# Patient Record
Sex: Male | Born: 1935 | Race: White | Hispanic: No | Marital: Single | Smoking: Former smoker
Health system: Southern US, Community
[De-identification: ages and names within clinical notes are randomized; demographics above are authoritative.]

## PROBLEM LIST (undated history)

## (undated) DIAGNOSIS — E119 Type 2 diabetes mellitus without complications: Secondary | ICD-10-CM

## (undated) DIAGNOSIS — I10 Essential (primary) hypertension: Secondary | ICD-10-CM

## (undated) DIAGNOSIS — D649 Anemia, unspecified: Secondary | ICD-10-CM

## (undated) DIAGNOSIS — Z950 Presence of cardiac pacemaker: Secondary | ICD-10-CM

## (undated) DIAGNOSIS — N400 Enlarged prostate without lower urinary tract symptoms: Secondary | ICD-10-CM

## (undated) DIAGNOSIS — Z9181 History of falling: Secondary | ICD-10-CM

## (undated) DIAGNOSIS — H548 Legal blindness, as defined in USA: Secondary | ICD-10-CM

## (undated) DIAGNOSIS — G4700 Insomnia, unspecified: Secondary | ICD-10-CM

## (undated) DIAGNOSIS — I442 Atrioventricular block, complete: Secondary | ICD-10-CM

## (undated) DIAGNOSIS — I509 Heart failure, unspecified: Secondary | ICD-10-CM

## (undated) DIAGNOSIS — F0390 Unspecified dementia without behavioral disturbance: Secondary | ICD-10-CM

## (undated) DIAGNOSIS — R001 Bradycardia, unspecified: Secondary | ICD-10-CM

## (undated) DIAGNOSIS — R55 Syncope and collapse: Secondary | ICD-10-CM

## (undated) HISTORY — DX: Type 2 diabetes mellitus without complications: E11.9

## (undated) HISTORY — DX: Essential (primary) hypertension: I10

## (undated) HISTORY — DX: Insomnia, unspecified: G47.00

## (undated) HISTORY — DX: Benign prostatic hyperplasia without lower urinary tract symptoms: N40.0

---

## 2004-08-30 ENCOUNTER — Inpatient Hospital Stay (HOSPITAL_COMMUNITY): Admission: EM | Admit: 2004-08-30 | Discharge: 2004-09-05 | Payer: Self-pay | Admitting: Emergency Medicine

## 2004-09-10 ENCOUNTER — Inpatient Hospital Stay (HOSPITAL_COMMUNITY): Admission: EM | Admit: 2004-09-10 | Discharge: 2004-09-17 | Payer: Self-pay | Admitting: Emergency Medicine

## 2004-09-25 ENCOUNTER — Ambulatory Visit: Payer: Self-pay | Admitting: Internal Medicine

## 2004-09-25 ENCOUNTER — Inpatient Hospital Stay (HOSPITAL_COMMUNITY): Admission: EM | Admit: 2004-09-25 | Discharge: 2004-10-24 | Payer: Self-pay | Admitting: Emergency Medicine

## 2004-10-01 ENCOUNTER — Ambulatory Visit: Payer: Self-pay | Admitting: Internal Medicine

## 2004-10-02 ENCOUNTER — Ambulatory Visit: Payer: Self-pay | Admitting: Internal Medicine

## 2004-10-08 ENCOUNTER — Encounter: Payer: Self-pay | Admitting: *Deleted

## 2004-11-02 ENCOUNTER — Observation Stay (HOSPITAL_COMMUNITY): Admission: RE | Admit: 2004-11-02 | Discharge: 2004-11-03 | Payer: Self-pay | Admitting: Internal Medicine

## 2005-06-28 IMAGING — CR DG CHEST 1V PORT
1 series · 1 of 1 positions shown · non-contrast
Comparison: 10/11/2004

CLINICAL DATA: Shortness of breath

PORTABLE CHEST - 1 VIEW

[view not recorded]
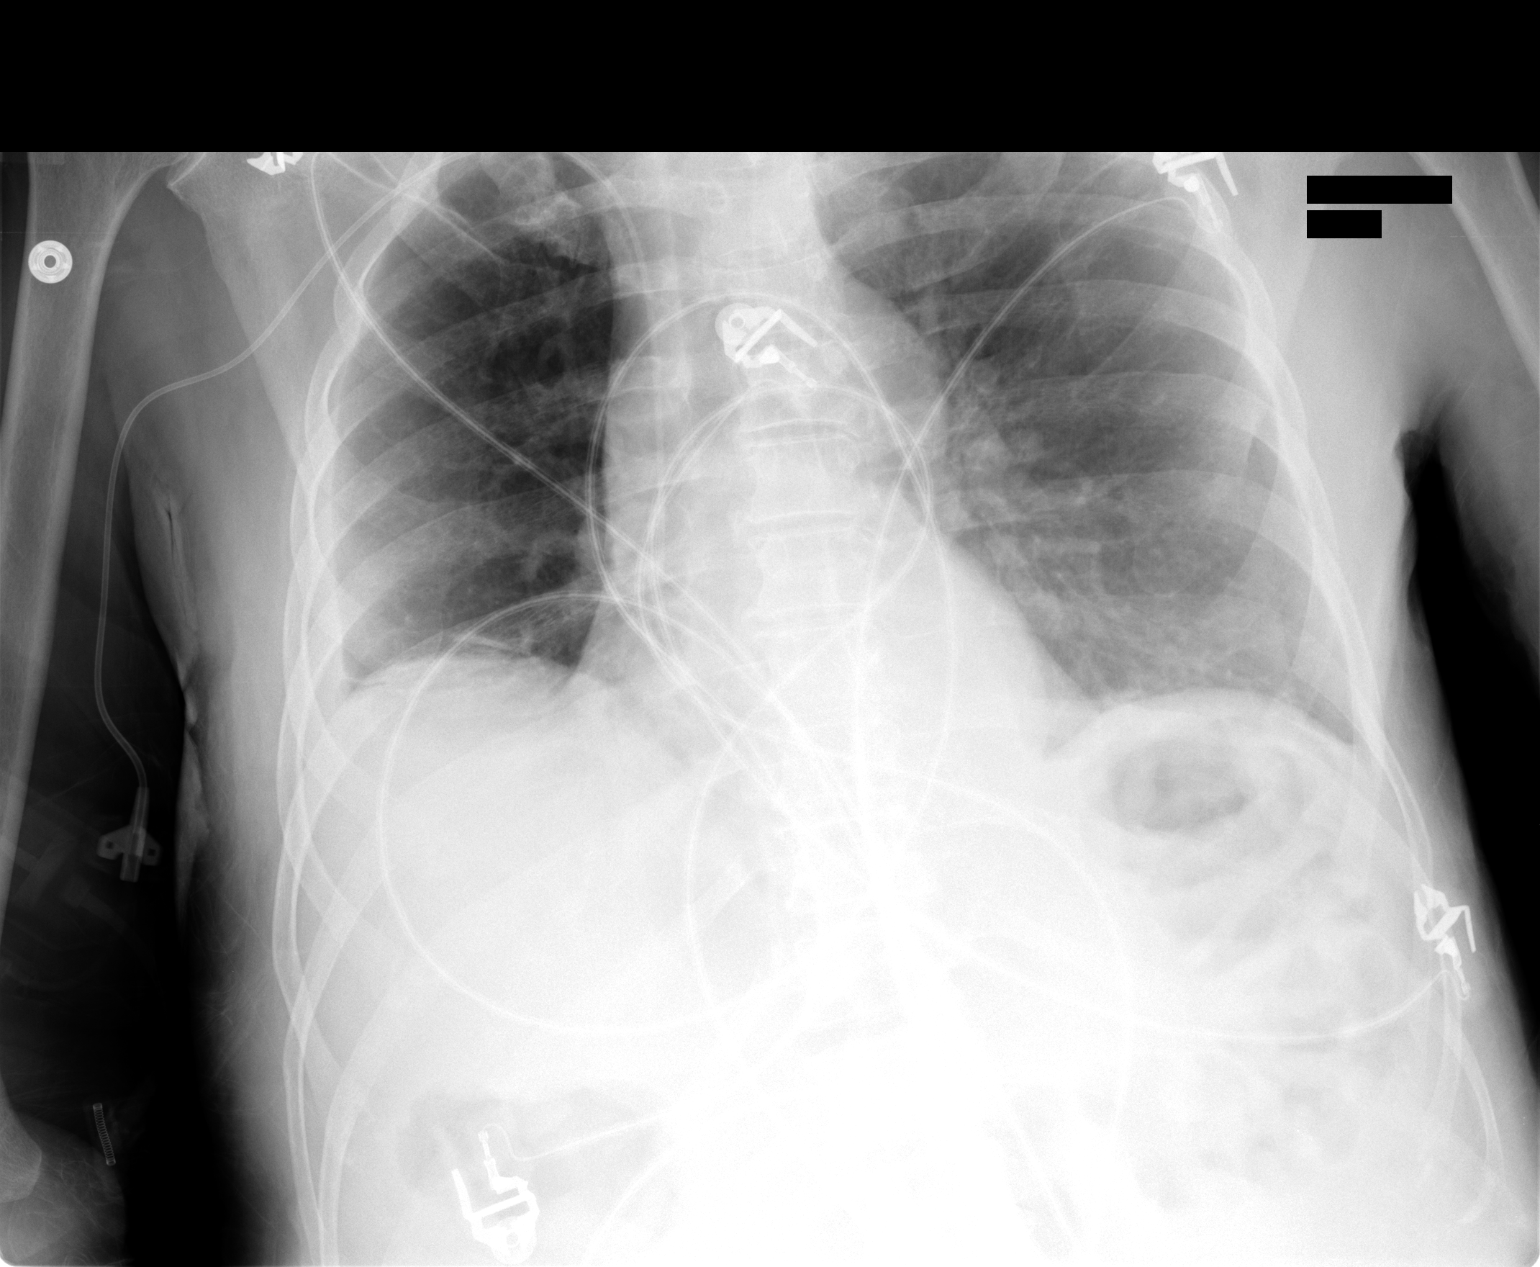

[1 of 1 positions shown; findings below may reference images not displayed]

FINDINGS: Right subclavian catheter tip at the junction of the superior vena
cava and right atrium. Interval removal of the feeding tube. Small right lateral
pleural effusion and mild right basilar atelectasis. Normal sized heart. Stable
mild prominence of the interstitial markings. Diffuse osteopenia.  

IMPRESSION

1. Small right pleural effusion and mild right basilar atelectasis.

2. Stable mild chronic interstitial lung disease.

## 2007-03-18 ENCOUNTER — Ambulatory Visit: Payer: Self-pay | Admitting: Cardiology

## 2007-04-29 ENCOUNTER — Ambulatory Visit: Payer: Self-pay | Admitting: Cardiology

## 2011-05-15 ENCOUNTER — Emergency Department (HOSPITAL_COMMUNITY)
Admission: EM | Admit: 2011-05-15 | Discharge: 2011-05-15 | Disposition: A | Discharge status: Skilled Nursing Facility | Payer: PRIVATE HEALTH INSURANCE | Attending: Emergency Medicine | Admitting: Emergency Medicine

## 2011-05-15 ENCOUNTER — Emergency Department (HOSPITAL_COMMUNITY): Payer: PRIVATE HEALTH INSURANCE

## 2011-05-15 ENCOUNTER — Encounter: Payer: Self-pay | Admitting: Emergency Medicine

## 2011-05-15 DIAGNOSIS — Z87891 Personal history of nicotine dependence: Secondary | ICD-10-CM | POA: Insufficient documentation

## 2011-05-15 DIAGNOSIS — E119 Type 2 diabetes mellitus without complications: Secondary | ICD-10-CM | POA: Insufficient documentation

## 2011-05-15 DIAGNOSIS — F0280 Dementia in other diseases classified elsewhere without behavioral disturbance: Secondary | ICD-10-CM | POA: Insufficient documentation

## 2011-05-15 DIAGNOSIS — G3183 Dementia with Lewy bodies: Secondary | ICD-10-CM | POA: Insufficient documentation

## 2011-05-15 DIAGNOSIS — R739 Hyperglycemia, unspecified: Secondary | ICD-10-CM

## 2011-05-15 DIAGNOSIS — I10 Essential (primary) hypertension: Secondary | ICD-10-CM | POA: Insufficient documentation

## 2011-05-15 DIAGNOSIS — E860 Dehydration: Secondary | ICD-10-CM | POA: Insufficient documentation

## 2011-05-15 HISTORY — DX: Unspecified dementia, unspecified severity, without behavioral disturbance, psychotic disturbance, mood disturbance, and anxiety: F03.90

## 2011-05-15 LAB — GLUCOSE, CAPILLARY

## 2011-05-15 LAB — URINALYSIS, ROUTINE W REFLEX MICROSCOPIC
Bilirubin Urine: NEGATIVE
Glucose, UA: >1000 mg/dL — AB
Hgb urine dipstick: NEGATIVE
Ketones, ur: NEGATIVE mg/dL
Leukocytes, UA: NEGATIVE
Nitrite: NEGATIVE
Protein, ur: NEGATIVE mg/dL
Specific Gravity, Urine: >1.03 — ABNORMAL HIGH (ref 1.005–1.030)
pH: 5.5 (ref 5.0–8.0)

## 2011-05-15 LAB — URINE MICROSCOPIC-ADD ON

## 2011-05-15 LAB — BASIC METABOLIC PANEL
CO2: 24 mEq/L (ref 19–32)
Calcium: 9.6 mg/dL (ref 8.4–10.5)
Chloride: 106 mEq/L (ref 96–112)
Creatinine, Ser: 1.15 mg/dL (ref 0.50–1.35)
GFR calc Af Amer: >60 mL/min (ref >60)
Glucose, Bld: 304 mg/dL — ABNORMAL HIGH (ref 70–99)

## 2011-05-15 MED ORDER — SODIUM CHLORIDE 0.9 % IV BOLUS (SEPSIS)
1000.0000 mL | Freq: Once | INTRAVENOUS | Status: AC
  Administered 2011-05-15: 1000 mL via INTRAVENOUS

## 2011-05-15 NOTE — ED Notes (Signed)
Pt resting. Nad. Awaiting pa re eval. Liter bolus in.

## 2011-05-15 NOTE — ED Notes (Signed)
Attempted in and out catherization failed. Pt did not urinate. FG, NT

## 2011-05-15 NOTE — ED Notes (Signed)
Darrall Dears from [facility] called to let us know pt has to be able to walk before he can come back there to the assisted living. Also stated pt fell x 2 today and was unable to walk. Pt able to stand at bedside and walk a couple of feet on own in ED. Nad. J Idol PA aware.

## 2011-05-15 NOTE — ED Notes (Signed)
Pt intermittantly sleeping. nad

## 2011-05-15 NOTE — ED Notes (Signed)
bs en route 284

## 2011-05-15 NOTE — ED Notes (Signed)
Pt smelled like urine. Depends wet, pants and depends off, pt cleaned. nad

## 2011-05-15 NOTE — ED Notes (Signed)
[facility] called and report given to [place]. LaToya given to report also. Facility said to call EMS for transport back

## 2011-05-15 NOTE — ED Notes (Signed)
[facility] notified of patient needing transportation back to facility.

## 2011-05-15 NOTE — ED Notes (Signed)
No change. Pt resting. Pt urinated on own. Sent to lab. Nad.

## 2011-05-15 NOTE — ED Notes (Signed)
Pt ambulated by Karen Chafe RN around nursing desk. Pt needed slight help with holding L hand and was slightly unstable. Unsure of baseline. 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 Pt does not appear weak and states he feels "fine" at this time. Nad.

## 2011-05-15 NOTE — ED Notes (Signed)
No change. Pt attempting to urinate at this time.

## 2011-05-15 NOTE — ED Notes (Signed)
Pt receiving liter bolus. Nad. No change.

## 2011-05-15 NOTE — ED Notes (Addendum)
ems states called out for stroke sx's. Paramedic states ran him in past before and seen no difference is neuro status. Pt came in alert/oriented. Smile symmetrical. Arm drift negative. Hand grips equal. Leg strength equal. Pt blind in L eye and states it always stays shut. Pt c/o "stinging" when he urinates. States had a normal bm yesterday and denies black or bloody stools. nad at this time. Pt also c/o "a little bit" of pain to mid abd area. Denies n/v

## 2011-05-15 NOTE — ED Provider Notes (Signed)
History     Chief Complaint  Patient presents with  . Urinary Frequency   HPI Comments: Spoke with caregiver Burna Mortimer from assisted living facility,  States patient has developed weakness,  Unable to walk this am without assistance, along with urinary incontinence and dark, strong smelling urine  and fecal incontinence x 1 when attempting to clean him of urine this am.  He is normally mildly demented but walks and toilets without assistance.  He ate breakfast this morning.  He has had no fevers,  No diarrhea or constipation..    Patient is a 77 y.o. male presenting with frequency. The history is provided by the patient and the nursing home.  Urinary Frequency This is a new problem. The current episode started today. The problem occurs constantly. Associated symptoms include weakness. Pertinent negatives include no abdominal pain, arthralgias, chest pain, congestion, fever, headaches, joint swelling, nausea, neck pain, numbness, rash or sore throat.  Urinary Frequency This is a new problem. The current episode started today. The problem occurs constantly. Pertinent negatives include no chest pain, no abdominal pain, no headaches and no shortness of breath.    Past Medical History  Diagnosis Date  . Hypertension   . Diabetes mellitus   . Dementia   . Parkinson's disease     Past Surgical History  Procedure Date  . Unknown     History reviewed. No pertinent family history.  History  Substance Use Topics  . Smoking status: Former Games developer  . Smokeless tobacco: Not on file  . Alcohol Use: No      Review of Systems  Constitutional: Negative for fever.  HENT: Negative for congestion, sore throat and neck pain.   Eyes: Negative.   Respiratory: Negative for chest tightness and shortness of breath.   Cardiovascular: Negative for chest pain.  Gastrointestinal: Negative for nausea and abdominal pain.  Genitourinary: Positive for dysuria and frequency.  Musculoskeletal: Negative for  joint swelling and arthralgias.  Skin: Negative.  Negative for rash and wound.  Neurological: Positive for weakness. Negative for dizziness, light-headedness, numbness and headaches.  Hematological: Negative.   Psychiatric/Behavioral: Negative.     Physical Exam  BP 137/84  Pulse 65  Temp(Src) 98.5 F (36.9 C) (Oral)  Resp 20  SpO2 96%  Physical Exam  Vitals reviewed. Constitutional: He is oriented to person, place, and time. He appears well-developed and well-nourished.  HENT:  Head: Normocephalic and atraumatic.  Eyes: Conjunctivae are normal.  Neck: Normal range of motion.  Cardiovascular: Normal rate, regular rhythm, normal heart sounds and intact distal pulses.   Pulmonary/Chest: Effort normal and breath sounds normal. He has no wheezes.  Abdominal: Soft. Bowel sounds are normal. There is no tenderness.  Musculoskeletal: Normal range of motion.  Neurological: He is alert and oriented to person, place, and time.  Skin: Skin is warm and dry.  Psychiatric: He has a normal mood and affect.    ED Course  Procedures Given IV fluids. MDM Patient ambulated in dept without difficulty.  States feels better and ready to go home.   Medical screening examination/treatment/procedure(s) were conducted as a shared visit with non-physician practitioner(s) and myself.  I personally evaluated the patient during the encounter   Candis Musa, [place] 05/15/11 1857  Shelda Jakes, MD 05/31/11 [phone]

## 2011-05-15 NOTE — ED Notes (Signed)
Pt only had couple drops in urinal. F graumann in to cath pt now. No change.

## 2011-05-15 NOTE — ED Provider Notes (Signed)
History     Chief Complaint  Patient presents with  . Urinary Frequency   HPI  Past Medical History  Diagnosis Date  . Hypertension   . Diabetes mellitus   . Dementia   . Parkinson's disease     Past Surgical History  Procedure Date  . Unknown     History reviewed. No pertinent family history.  History  Substance Use Topics  . Smoking status: Former Games developer  . Smokeless tobacco: Not on file  . Alcohol Use: No      Review of Systems  Physical Exam  BP 137/84  Pulse 65  Temp(Src) 98.5 F (36.9 C) (Oral)  Resp 20  SpO2 96%  Physical Exam  ED Course  Procedures  MDM PATIENT SEEN BY ME AND IF CXR NEGATIVE OKAY TO RETURN TO REST HOME IN [place]. CAN HAVE FOLLOW UP WITH DR Felecia Shelling.  Medical screening examination/treatment/procedure(s) were conducted as a shared visit with non-physician practitioner(s) and myself.  I personally evaluated the patient during the encounter      Shelda Jakes, MD 05/15/11 Windy Fast

## 2011-05-15 NOTE — ED Notes (Signed)
No urine o/p with in/out cath per NT, pt's depends was soaked. J Idol PA aware, diet gingerale given.

## 2011-08-02 ENCOUNTER — Emergency Department (HOSPITAL_COMMUNITY)
Admission: EM | Admit: 2011-08-02 | Discharge: 2011-08-02 | Discharge status: Against Medical Advice | Payer: PRIVATE HEALTH INSURANCE | Attending: Emergency Medicine | Admitting: Emergency Medicine

## 2011-08-02 ENCOUNTER — Encounter (HOSPITAL_COMMUNITY): Payer: Self-pay | Admitting: Emergency Medicine

## 2011-08-02 DIAGNOSIS — I10 Essential (primary) hypertension: Secondary | ICD-10-CM | POA: Insufficient documentation

## 2011-08-02 DIAGNOSIS — Z87891 Personal history of nicotine dependence: Secondary | ICD-10-CM | POA: Insufficient documentation

## 2011-08-02 DIAGNOSIS — G20 Parkinson's disease: Secondary | ICD-10-CM | POA: Insufficient documentation

## 2011-08-02 DIAGNOSIS — G20A1 Parkinson's disease without dyskinesia, without mention of fluctuations: Secondary | ICD-10-CM | POA: Insufficient documentation

## 2011-08-02 DIAGNOSIS — E119 Type 2 diabetes mellitus without complications: Secondary | ICD-10-CM | POA: Insufficient documentation

## 2011-08-02 DIAGNOSIS — F0390 Unspecified dementia without behavioral disturbance: Secondary | ICD-10-CM | POA: Insufficient documentation

## 2011-08-02 DIAGNOSIS — L299 Pruritus, unspecified: Secondary | ICD-10-CM

## 2011-08-02 MED ORDER — PREDNISONE 20 MG PO TABS
40.0000 mg | ORAL_TABLET | Freq: Once | ORAL | Status: AC
  Administered 2011-08-02: 40 mg via ORAL
  Filled 2011-08-02: qty 1

## 2011-08-02 MED ORDER — DIPHENHYDRAMINE HCL 25 MG PO CAPS
25.0000 mg | ORAL_CAPSULE | Freq: Once | ORAL | Status: AC
  Administered 2011-08-02: 25 mg via ORAL
  Filled 2011-08-02: qty 1

## 2011-08-02 MED ORDER — PREDNISONE 10 MG PO TABS: ORAL_TABLET | ORAL | Status: DC

## 2011-08-02 MED ORDER — DIPHENHYDRAMINE HCL 25 MG PO CAPS: 25.0000 mg | ORAL_CAPSULE | Freq: Four times a day (QID) | ORAL | Status: AC | PRN

## 2011-08-02 NOTE — ED Notes (Signed)
Pt c/o generalized itchy rash 

## 2011-08-02 NOTE — ED Provider Notes (Signed)
History     CSN: [id number] Arrival date & time: 08/02/2011  1:29 PM   First MD Initiated Contact with Patient 08/02/11 1418      Chief Complaint  Patient presents with  . Rash    (Consider location/radiation/quality/duration/timing/severity/associated sxs/prior treatment) Patient is a 77 y.o. male presenting with rash. The history is provided by a caregiver (Assisted living resident who presents with itching which has worsened involving bleeding of his ankles from rigorouis scratching.  He has been scratching since admitted to the facility 8/12,  but has become worse.).  Rash  This is a chronic problem. The problem has been gradually worsening. The problem is associated with an unknown factor. The rash is present on the right arm, left lower leg, right lower leg and groin. The pain is at a severity of 0/10. The patient is experiencing no pain. The pain has been constant since onset. Associated symptoms include itching. Pertinent negatives include no blisters, no pain and no weeping. Treatments tried: He is currently being given hydrocortisone cream and clotrimazole.  He was on hydroxyzine  but it is unclear if this was helpful,  given patients dementia. The treatment provided no relief.    Past Medical History  Diagnosis Date  . Hypertension   . Diabetes mellitus   . Dementia   . Parkinson's disease     Past Surgical History  Procedure Date  . Unknown     No family history on file.  History  Substance Use Topics  . Smoking status: Former Games developer  . Smokeless tobacco: Not on file  . Alcohol Use: No      Review of Systems  Constitutional: Negative for fever.  HENT: Negative for congestion, sore throat and sneezing.   Eyes: Negative for redness.  Respiratory: Negative for cough and shortness of breath.   Cardiovascular: Negative for leg swelling.  Gastrointestinal: Negative for nausea and abdominal pain.  Genitourinary: Negative.  Negative for hematuria.    Musculoskeletal: Negative for joint swelling.  Skin: Positive for itching. Negative for color change, rash and wound.  Neurological:       Patient with significant dementia.  Predominantly non ambulatory.  Hematological: Negative.   Psychiatric/Behavioral: Negative.     Allergies  Review of patient's allergies indicates no known allergies.  Home Medications   Current Outpatient Rx  Name Route Sig Dispense Refill  . ASPIRIN EC 81 MG PO TBEC Oral Take 81 mg by mouth daily.      Marland Kitchen CLOTRIMAZOLE-BETAMETHASONE 1-0.05 % EX CREA Topical Apply 1 application topically 2 (two) times daily as needed. Itching      . ESCITALOPRAM OXALATE 5 MG/5ML PO SOLN Oral Take 10 mg by mouth daily.      Marland Kitchen HALOPERIDOL 1 MG PO TABS Oral Take 1 mg by mouth at bedtime.      Marland Kitchen HYDROCORTISONE 2.5 % EX CREA Topical Apply 1 application topically 3 (three) times daily.      Marland Kitchen HYDROXYZINE HCL 25 MG PO TABS Oral Take 25 mg by mouth every 6 (six) hours as needed. itching     . INSULIN ASPART 100 UNIT/ML [place] SOLN Subcutaneous Inject 10 Units into the skin 3 (three) times daily before meals. If blood sugar is 70 or above 300 call MD 150-200 2 units(total 12 units) 201-250  3 units(total 13 units) 251-300  4 units(total 14 units) 301- 350 5 units (total 15 units) 351-400 6 units(total 16 units) Greater than 400 7 units (total 17 units)       .  INSULIN DETEMIR 100 UNIT/ML [place] SOLN Subcutaneous Inject 30 Units into the skin at bedtime.      . ISOSORBIDE MONONITRATE ER 30 MG PO TB24 Oral Take 30 mg by mouth daily.      Marland Kitchen SITAGLIPTIN PHOSPHATE 50 MG PO TABS Oral Take 50 mg by mouth daily.      Marland Kitchen TAMSULOSIN HCL 0.4 MG PO CAPS Oral Take 0.4 mg by mouth daily.      Marland Kitchen TERAZOSIN HCL 5 MG PO CAPS Oral Take 5 mg by mouth daily.      Marland Kitchen VITAMIN B-1 100 MG PO TABS Oral Take 100 mg by mouth daily.        BP 132/52  Pulse 68  Temp(Src) 99.5 F (37.5 C) (Oral)  Resp 20  Ht 6' (1.829 m)  Wt 205 lb (92.987 kg)  BMI 27.80 kg/m2   SpO2 96%  Physical Exam  Nursing note and vitals reviewed. Constitutional: He appears well-developed and well-nourished.  HENT:  Head: Normocephalic and atraumatic.  Eyes: Conjunctivae are normal.  Neck: Normal range of motion.  Cardiovascular: Normal rate, regular rhythm, normal heart sounds and intact distal pulses.   Pulmonary/Chest: Effort normal and breath sounds normal. He has no wheezes.  Abdominal: Soft. Bowel sounds are normal. There is no tenderness.  Musculoskeletal: Normal range of motion.  Neurological: He is alert.       Pleasantly demented,  Cooperative for exam.  Skin: Skin is warm and dry. No rash noted. No erythema.       Areas of small scabs on anterior ankles consistent with excoriations with no evidence of rash.  Scrotal skin is excoriated,  No erythema or edema,  No drainage.  Psychiatric: He has a normal mood and affect.    ED Course  Procedures (including critical care time)  Labs Reviewed - No data to display No results found.   No diagnosis found.    MDM  Pruritis without identifiable source.  No jaundice,  No rash identified,  No erythema or drainage.          Candis Musa, PA 08/02/11 [phone]

## 2011-08-06 NOTE — ED Provider Notes (Signed)
Medical screening examination/treatment/procedure(s) were performed by non-physician practitioner and as supervising physician I was immediately available for consultation/collaboration.   Nelia Shi, MD 08/06/11 2221

## 2013-05-26 ENCOUNTER — Emergency Department (HOSPITAL_COMMUNITY): Payer: PRIVATE HEALTH INSURANCE

## 2013-05-26 ENCOUNTER — Encounter (HOSPITAL_COMMUNITY): Payer: Self-pay | Admitting: Emergency Medicine

## 2013-05-26 ENCOUNTER — Emergency Department (HOSPITAL_COMMUNITY)
Admission: EM | Admit: 2013-05-26 | Discharge: 2013-05-26 | Disposition: A | Discharge status: Home / Self Care | Payer: PRIVATE HEALTH INSURANCE | Attending: Emergency Medicine | Admitting: Emergency Medicine

## 2013-05-26 DIAGNOSIS — Z87891 Personal history of nicotine dependence: Secondary | ICD-10-CM | POA: Insufficient documentation

## 2013-05-26 DIAGNOSIS — F0390 Unspecified dementia without behavioral disturbance: Secondary | ICD-10-CM | POA: Insufficient documentation

## 2013-05-26 DIAGNOSIS — R55 Syncope and collapse: Secondary | ICD-10-CM | POA: Insufficient documentation

## 2013-05-26 DIAGNOSIS — E119 Type 2 diabetes mellitus without complications: Secondary | ICD-10-CM | POA: Insufficient documentation

## 2013-05-26 DIAGNOSIS — R112 Nausea with vomiting, unspecified: Secondary | ICD-10-CM | POA: Insufficient documentation

## 2013-05-26 DIAGNOSIS — I10 Essential (primary) hypertension: Secondary | ICD-10-CM | POA: Insufficient documentation

## 2013-05-26 DIAGNOSIS — Z794 Long term (current) use of insulin: Secondary | ICD-10-CM | POA: Insufficient documentation

## 2013-05-26 DIAGNOSIS — Z79899 Other long term (current) drug therapy: Secondary | ICD-10-CM | POA: Insufficient documentation

## 2013-05-26 DIAGNOSIS — R0602 Shortness of breath: Secondary | ICD-10-CM | POA: Insufficient documentation

## 2013-05-26 DIAGNOSIS — R42 Dizziness and giddiness: Secondary | ICD-10-CM | POA: Insufficient documentation

## 2013-05-26 DIAGNOSIS — Z7982 Long term (current) use of aspirin: Secondary | ICD-10-CM | POA: Insufficient documentation

## 2013-05-26 LAB — CBC WITH DIFFERENTIAL/PLATELET
Basophils Relative: 0 % (ref 0–1)
HCT: 41.2 % (ref 39.0–52.0)
Hemoglobin: 13.5 g/dL (ref 13.0–17.0)
MCH: 28.8 pg (ref 26.0–34.0)
MCHC: 32.8 g/dL (ref 30.0–36.0)
Monocytes Absolute: 0.9 10*3/uL (ref 0.1–1.0)
Monocytes Relative: 9 % (ref 3–12)
Neutro Abs: 5.2 10*3/uL (ref 1.7–7.7)

## 2013-05-26 LAB — COMPREHENSIVE METABOLIC PANEL
Albumin: 3.5 g/dL (ref 3.5–5.2)
BUN: 31 mg/dL — ABNORMAL HIGH (ref 6–23)
Chloride: 103 mEq/L (ref 96–112)
Creatinine, Ser: 1.26 mg/dL (ref 0.50–1.35)
GFR calc Af Amer: 62 mL/min — ABNORMAL LOW (ref >90)
GFR calc non Af Amer: 53 mL/min — ABNORMAL LOW (ref >90)
Glucose, Bld: 134 mg/dL — ABNORMAL HIGH (ref 70–99)
Total Bilirubin: 0.2 mg/dL — ABNORMAL LOW (ref 0.3–1.2)

## 2013-05-26 LAB — URINALYSIS, ROUTINE W REFLEX MICROSCOPIC
Glucose, UA: NEGATIVE mg/dL
Leukocytes, UA: NEGATIVE
Nitrite: NEGATIVE
Protein, ur: NEGATIVE mg/dL
Urobilinogen, UA: 0.2 mg/dL (ref 0.0–1.0)

## 2013-05-26 LAB — TROPONIN I: Troponin I: <0.3 ng/mL (ref <0.30)

## 2013-05-26 LAB — RAPID URINE DRUG SCREEN, HOSP PERFORMED: Barbiturates: NOT DETECTED

## 2013-05-26 NOTE — ED Provider Notes (Signed)
CSN: [id number]     Arrival date & time 05/26/13  1732 History  This chart was scribed for Ward Givens, MD by Greggory Stallion, ED Scribe. This patient was seen in room APA05/APA05 and the patient's care was started at 5:42 PM.   Chief Complaint  Patient presents with  . Near Syncope  . Fall  . Dizziness   The history is provided by the EMS personnel, medical records and the patient. No language interpreter was used.    HPI Comments: Cody [name] is a 79 y.o. male with h/o dementia brought to ED by EMS who presents to the Emergency Department complaining of sudden onset, intermittent falls and dizziness. Pt lives at [facility]. He was at [facility] today and on 7/2 being evaluated for  falls and was sent here. He states he feels fine today. His last fall was yesterday. Pt states he gets dizzy spells and feels like he is going to pass out. He states he gets SOB and sometimes has had episodes of nausea and emesis. He states the episodes are associated with a feeling that he is going to  loose consciousness and happen when he's at rest, sitting or standing up. He denies actual LOC. He has not had any injury with the falls. Pt normally has these episodes every two weeks. He thinks this has been happening for a few months now. Pt denies HA, leg pain and chest pain as associated symptoms. Nothing makes the episode happen and nothing makes it stop. None of his medications have been changed recently. Pt uses a walker to get around.   Pt does not smoke cigarettes or drink alcohol.   PCP [facility]  Past Medical History  Diagnosis Date  . Hypertension   . Diabetes mellitus   . Dementia   . Parkinson's disease    Past Surgical History  Procedure Laterality Date  . Unknown     History reviewed. No pertinent family history. History  Substance Use Topics  . Smoking status: Former Smoker -- 0.50 packs/day for 4 years    Types: Cigarettes  . Smokeless  tobacco: Never Used  . Alcohol Use: No  lives in ALF Uses a walker  Review of Systems  Respiratory: Positive for shortness of breath.   Cardiovascular: Negative for chest pain.  Gastrointestinal: Positive for nausea and vomiting.  Neurological: Positive for dizziness and syncope. Negative for headaches.  All other systems reviewed and are negative.    Allergies  Review of patient's allergies indicates no known allergies.  Home Medications   Current Outpatient Rx  Name  Route  Sig  Dispense  Refill  . aspirin EC 81 MG tablet   Oral   Take 81 mg by mouth daily.           . clotrimazole-betamethasone (LOTRISONE) cream   Topical   Apply 1 application topically 2 (two) times daily as needed. Itching           . escitalopram (LEXAPRO) 5 MG/5ML solution   Oral   Take 10 mg by mouth daily.           . haloperidol (HALDOL) 1 MG tablet   Oral   Take 1 mg by mouth at bedtime.           . hydrocortisone 2.5 % cream   Topical   Apply 1 application topically 3 (three) times daily.           . hydrOXYzine (  ATARAX) 25 MG tablet   Oral   Take 25 mg by mouth every 6 (six) hours as needed. itching          . insulin aspart (NOVOLOG) 100 UNIT/ML injection   Subcutaneous   Inject 10 Units into the skin 3 (three) times daily before meals. If blood sugar is 70 or above 300 call MD 150-200 2 units(total 12 units) 201-250  3 units(total 13 units) 251-300  4 units(total 14 units) 301- 350 5 units (total 15 units) 351-400 6 units(total 16 units) Greater than 400 7 units (total 17 units)            . insulin detemir (LEVEMIR) 100 UNIT/ML injection   Subcutaneous   Inject 30 Units into the skin at bedtime.           . isosorbide mononitrate (IMDUR) 30 MG 24 hr tablet   Oral   Take 30 mg by mouth daily.           . predniSONE (DELTASONE) 10 MG tablet      Take 4 tablets for 2 days,  Then 3 tablets for 2 days,  2 tablets for 2 days,  Then 1 tablet for 2 days.    20 tablet   0   . sitaGLIPtin (JANUVIA) 50 MG tablet   Oral   Take 50 mg by mouth daily.           . Tamsulosin HCl (FLOMAX) 0.4 MG CAPS   Oral   Take 0.4 mg by mouth daily.           Marland Kitchen terazosin (HYTRIN) 5 MG capsule   Oral   Take 5 mg by mouth daily.           . Thiamine HCl (VITAMIN B-1) 100 MG tablet   Oral   Take 100 mg by mouth daily.            BP 159/64  Pulse 56  Temp(Src) 98.4 F (36.9 C) (Oral)  Resp 20  Ht 5\' 9"  (1.753 m)  Wt 195 lb (88.451 kg)  BMI 28.78 kg/m2  SpO2 99%  Vital signs normal except bradycardia   Physical Exam  Nursing note and vitals reviewed. Constitutional: He is oriented to person, place, and time. He appears well-developed and well-nourished.  Non-toxic appearance. He does not appear ill. No distress.  HENT:  Head: Normocephalic and atraumatic.  Right Ear: External ear normal.  Left Ear: External ear normal.  Nose: Nose normal. No mucosal edema or rhinorrhea.  Mouth/Throat: Oropharynx is clear and moist and mucous membranes are normal. No dental abscesses or edematous.  Edentulous.  Eyes: EOM are normal.  Left globe is gone.  Neck: Normal range of motion and full passive range of motion without pain. Neck supple.  Cardiovascular: Normal rate, regular rhythm and normal heart sounds.  Exam reveals no gallop and no friction rub.   No murmur heard. Pulmonary/Chest: Effort normal and breath sounds normal. No respiratory distress. He has no wheezes. He has no rhonchi. He has no rales. He exhibits no tenderness and no crepitus.  Abdominal: Soft. Normal appearance and bowel sounds are normal. He exhibits no distension. There is no tenderness. There is no rebound and no guarding.  Musculoskeletal: Normal range of motion. He exhibits no edema and no tenderness.  Moves all extremities well.   Neurological: He is alert and oriented to person, place, and time. He has normal strength. No cranial nerve deficit.  Neurovascularly intact.  Skin: Skin is warm, dry and intact. No rash noted. No erythema. No pallor.  Psychiatric: He has a normal mood and affect. His speech is normal and behavior is normal. His mood appears not anxious.    ED Course   Procedures (including critical care time)  Medications - No data to display  DIAGNOSTIC STUDIES: Oxygen Saturation is 99% on RA, normal by my interpretation.    COORDINATION OF CARE: 6:04 PM-Discussed treatment plan which includes labs and chest xray with pt at bedside and pt agreed to plan.   Pt refused to stand for orthostatic VS. He remained stable while in the ED.   Results for orders placed during the hospital encounter of 05/26/13  CBC WITH DIFFERENTIAL      Result Value Range   WBC 9.3  4.0 - 10.5 K/uL   RBC 4.69  4.22 - 5.81 MIL/uL   Hemoglobin 13.5  13.0 - 17.0 g/dL   HCT 40.9  81.1 - 91.4 %   MCV 87.8  78.0 - 100.0 fL   MCH 28.8  26.0 - 34.0 pg   MCHC 32.8  30.0 - 36.0 g/dL   RDW 78.2  95.6 - 21.3 %   Platelets 200  150 - 400 K/uL   Neutrophils Relative % 56  43 - 77 %   Neutro Abs 5.2  1.7 - 7.7 K/uL   Lymphocytes Relative 29  12 - 46 %   Lymphs Abs 2.7  0.7 - 4.0 K/uL   Monocytes Relative 9  3 - 12 %   Monocytes Absolute 0.9  0.1 - 1.0 K/uL   Eosinophils Relative 6 (*) 0 - 5 %   Eosinophils Absolute 0.5  0.0 - 0.7 K/uL   Basophils Relative 0  0 - 1 %   Basophils Absolute 0.0  0.0 - 0.1 K/uL  COMPREHENSIVE METABOLIC PANEL      Result Value Range   Sodium 139  135 - 145 mEq/L   Potassium 4.2  3.5 - 5.1 mEq/L   Chloride 103  96 - 112 mEq/L   CO2 29  19 - 32 mEq/L   Glucose, Bld 134 (*) 70 - 99 mg/dL   BUN 31 (*) 6 - 23 mg/dL   Creatinine, Ser 0.86  0.50 - 1.35 mg/dL   Calcium 9.0  8.4 - 57.8 mg/dL   Total Protein 6.9  6.0 - 8.3 g/dL   Albumin 3.5  3.5 - 5.2 g/dL   AST 16  0 - 37 U/L   ALT 16  0 - 53 U/L   Alkaline Phosphatase 94  39 - 117 U/L   Total Bilirubin 0.2 (*) 0.3 - 1.2 mg/dL   GFR calc non Af Amer 53 (*) >90 mL/min   GFR calc Af  Amer 62 (*) >90 mL/min  URINALYSIS, ROUTINE W REFLEX MICROSCOPIC      Result Value Range   Color, Urine YELLOW  YELLOW   APPearance CLEAR  CLEAR   Specific Gravity, Urine >1.030 (*) 1.005 - 1.030   pH 6.0  5.0 - 8.0   Glucose, UA NEGATIVE  NEGATIVE mg/dL   Hgb urine dipstick NEGATIVE  NEGATIVE   Bilirubin Urine NEGATIVE  NEGATIVE   Ketones, ur NEGATIVE  NEGATIVE mg/dL   Protein, ur NEGATIVE  NEGATIVE mg/dL   Urobilinogen, UA 0.2  0.0 - 1.0 mg/dL   Nitrite NEGATIVE  NEGATIVE   Leukocytes, UA NEGATIVE  NEGATIVE  TROPONIN I      Result Value Range  Troponin I <0.30  <0.30 ng/mL  MAGNESIUM      Result Value Range   Magnesium 2.1  1.5 - 2.5 mg/dL   Laboratory interpretation all normal   Dg Chest 2 View  05/26/2013   *RADIOLOGY REPORT*  Clinical Data: Near syncope, fall/dizziness.  CHEST - 2 VIEW  Comparison: 05/15/2011  Findings: Lungs are somewhat hypoinflated with chronic elevation of the right hemidiaphragm with chronic linear scarring over the right base.  Remainder of the lungs are clear. Mild cardiomegaly unchanged.  Remainder of the exam is unchanged.  IMPRESSION: No acute cardiopulmonary disease.  Minimal chronic linear scarring over the right base.  Mild stable cardiomegaly.   Original Report Authenticated By: Elberta Fortis, M.D.   Ct Head Wo Contrast  05/26/2013   *RADIOLOGY REPORT*  Clinical Data: Near syncope, dizziness  CT HEAD WITHOUT CONTRAST  Technique:  Contiguous axial images were obtained from the base of the skull through the vertex without contrast.  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 Cortical volume loss noted with proportional ventricular prominence.  Periventricular white matter hypodensity likely indicates small vessel ischemic change.  No acute hemorrhage, acute infarction, or mass lesion is identified.  Probable sessile meningioma along the anterior frontal midline falx.  There is stable.  No midline shift.  Mild moderate diffuse  pansinusitis. No bony destruction. Soft tissue density in the bilateral external auditory canals likely represents cerumen.  IMPRESSION: No acute intracranial finding.  Chronic findings as above.   Original Report Authenticated By: Christiana Pellant, M.D.     Date: 05/26/2013  Rate: 56  Rhythm: sinus bradycardia  QRS Axis: left  Intervals: PR prolonged  ST/T Wave abnormalities: normal  Conduction Disutrbances:none  Narrative Interpretation: anteroseptal Q waves  Old EKG Reviewed: changes noted from 10/03/2004 Q waves anteroseptally are new    1. Near syncope     Plan discharge   Devoria Albe, MD, FACEP   MDM  patient presents with infrequent near syncopal events. Patient is noted to be on Haldol and have baseline bradycardia. He does not have a prolonged QT interval. He has no acute electrolyte abnormalities including magnesium. He needs a long-term monitor to see if he is having any arrhythmia when he has his near syncopal events since they happen when he is sitting or standing. He will be referred to cardiology to arrange that.     I personally performed the services described in this documentation, which was scribed in my presence. The recorded information has been reviewed and considered.  Devoria Albe, MD, Armando Gang   Ward Givens, MD 05/26/13 [phone]

## 2013-05-26 NOTE — ED Notes (Signed)
Patient would not attempt to stand for v/s.  Patient stated that he "never stood without his walker and he was afraid he would fall".

## 2013-05-26 NOTE — ED Notes (Addendum)
Patient brought in via EMS. Alert with some confusion to month and situation. Per EMS patient from [facility], has hx of dementia. Patient was at [facility] been evaluated for near syncope x2 weeks with multiple falls, was sent here. Patient reports dizziness with fall yestertday. Reports nausea and diaphoresis with dizziness. Denies any pain.

## 2013-06-15 ENCOUNTER — Emergency Department (HOSPITAL_COMMUNITY)
Admission: EM | Admit: 2013-06-15 | Discharge: 2013-06-15 | Discharge status: Against Medical Advice | Payer: PRIVATE HEALTH INSURANCE

## 2013-06-15 ENCOUNTER — Encounter: Payer: Self-pay | Admitting: Cardiology

## 2013-06-15 ENCOUNTER — Encounter: Payer: PRIVATE HEALTH INSURANCE | Admitting: Cardiology

## 2013-06-15 DIAGNOSIS — R55 Syncope and collapse: Secondary | ICD-10-CM | POA: Insufficient documentation

## 2013-06-15 DIAGNOSIS — I10 Essential (primary) hypertension: Secondary | ICD-10-CM | POA: Insufficient documentation

## 2013-06-15 NOTE — Progress Notes (Signed)
NNo-show. This encounter was created in error - please disregard. 

## 2013-06-21 ENCOUNTER — Ambulatory Visit: Payer: PRIVATE HEALTH INSURANCE | Admitting: Cardiology

## 2013-07-01 ENCOUNTER — Ambulatory Visit (INDEPENDENT_AMBULATORY_CARE_PROVIDER_SITE_OTHER): Payer: PRIVATE HEALTH INSURANCE | Admitting: Cardiology

## 2013-07-01 ENCOUNTER — Ambulatory Visit: Payer: PRIVATE HEALTH INSURANCE | Admitting: Cardiology

## 2013-07-01 ENCOUNTER — Encounter: Payer: Self-pay | Admitting: Cardiology

## 2013-07-01 VITALS — BP 135/66 | HR 70 | Ht 64.0 in | Wt 172.0 lb

## 2013-07-01 DIAGNOSIS — R55 Syncope and collapse: Secondary | ICD-10-CM

## 2013-07-01 NOTE — Progress Notes (Signed)
Clinical Summary Mr. Oaxaca is a 79 y.o.male 1. Near syncope:  - patient is poor historian, history is combination of his report and his nurses aid. He lives at [facility].  - reports symptoms started approx 1 month ago - feels lightheaded and dizzy, feels like going to pass out but doesn't. No chest pain. Reports some SOB and palpitaitons with episodes. Reports heart feels like its "thumping". Per ER notes describes some occasional nausea and emesis w/ falls - seems to occur more often when on his feet. Last episode 1 month ago. No reported seizure activity by nursing staff.  - in ER CT head negative, EKG as described below w/ 1st degree block, non-spec conduction delay, LAFB      Past Medical History  Diagnosis Date  . Essential hypertension, benign   . Type 2 diabetes mellitus   . Dementia   . Parkinson's disease      No Known Allergies   Current Outpatient Prescriptions  Medication Sig Dispense Refill  . aspirin EC 81 MG tablet Take 81 mg by mouth daily.        . clotrimazole-betamethasone (LOTRISONE) cream Apply 1 application topically 2 (two) times daily as needed. Itching        . escitalopram (LEXAPRO) 10 MG tablet Take 10 mg by mouth daily.      . haloperidol (HALDOL) 1 MG tablet Take 1 mg by mouth at bedtime.        . hydrocortisone 2.5 % cream Apply 1 application topically 3 (three) times daily.        . hydrOXYzine (ATARAX) 25 MG tablet Take 25 mg by mouth every 6 (six) hours as needed. itching       . insulin aspart (NOVOLOG) 100 UNIT/ML injection Inject 10 Units into the skin 3 (three) times daily before meals. If blood sugar is 70 or above 300 call MD 150-200 2 units(total 12 units) 201-250  3 units(total 13 units) 251-300  4 units(total 14 units) 301- 350 5 units (total 15 units) 351-400 6 units(total 16 units) Greater than 400 7 units (total 17 units)         . insulin detemir (LEVEMIR) 100 UNIT/ML injection Inject 30 Units into the  skin at bedtime.        Marland Kitchen linagliptin (TRADJENTA) 5 MG TABS tablet Take 5 mg by mouth daily.      . sitaGLIPtin (JANUVIA) 50 MG tablet Take 50 mg by mouth daily.        . Tamsulosin HCl (FLOMAX) 0.4 MG CAPS Take 0.4 mg by mouth daily.        Marland Kitchen terazosin (HYTRIN) 5 MG capsule Take 5 mg by mouth daily.        . Thiamine HCl (VITAMIN B-1) 100 MG tablet Take 100 mg by mouth daily.         No current facility-administered medications for this visit.     No past surgical history on file.   No Known Allergies    No family history on file.   Social History Mr. Kinzler reports that he has quit smoking. His smoking use included Cigarettes. He has a 2 pack-year smoking history. He has never used smokeless tobacco. Mr. Seales reports that he does not drink alcohol.   Review of Systems 12 point ROS negative other than reported in HPI  Physical Examination Filed Vitals:   07/01/13 0920  BP: 135/66  Pulse: 70   Filed Weights   07/01/13 0920  Weight: 172 lb (78.019 kg)   Orthostatics Laying down: p 60 bp 178/78 Sitting: p 66 bp 148/75 Standing p 80 bp 143/84  Gen: resting comfortably, NAD HEENT: no scleral icterus, pupils equal round and reactive, no palptable cervical adenopathy CV: RRR, no m/r/g, no JVD, no carotid bruit Pulm: CTAB Abd: soft, NT, ND NABS, no hepatosplenomegaly Ext: warm, no edema.  Skin: warm, no rash Neuro: A&Ox3, no focal deficits    Diagnostic Studies 05/2013 EKG: sinus, rate 70, LAFB, non-spec conduction delay, 1st degree heart block, LAE, non-spec ST-T    Assessment and Plan  1. Presyncope: difficult to to identify the etiology, the patient is a poor historian with multiple medical comorbidities. Symptoms are concerning for a possible symptomatic arrhytmia, however he is significantly orthostatic, has Parkinsons disease which can lead to autonomic dysfunction, and is on multiple medications that can lead to these symptoms including imdur and  flomax - stop imdur, no clear indication to continue this medication, pt has no known hx of CAD or heart failure - check 2D echo - I have asked the nursing home to bring a bp log next visit - obtain 30 day event monitor, I am told by the home nursing staff that they can assist with managing this for the patient.  F/u 6 weeks   Antoine Poche, M.D., F.A.C.C.

## 2013-07-01 NOTE — Patient Instructions (Addendum)
Your physician recommends that you schedule a follow-up appointment in: 6 weeks  Your physician has requested that you have an echocardiogram. Echocardiography is a painless test that uses sound waves to create images of your heart. It provides your doctor with information about the size and shape of your heart and how well your heart's chambers and valves are working. This procedure takes approximately one hour. There are no restrictions for this procedure.  Your physician has recommended that you wear an event monitor. Event monitors are medical devices that record the heart's electrical activity. Doctors most often [place] these monitors to diagnose arrhythmias. Arrhythmias are problems with the speed or rhythm of the heartbeat. The monitor is a small, portable device. You can wear one while you do your normal daily activities. This is usually used to diagnose what is causing palpitations/syncope (passing out).  WE WILL CALL YOU WITH THE RESULTS  Your physician has recommended you make the following change in your medication:   1) STOP TAKING IMDUR

## 2013-07-06 ENCOUNTER — Ambulatory Visit (HOSPITAL_COMMUNITY)
Admission: RE | Admit: 2013-07-06 | Discharge: 2013-07-06 | Disposition: A | Discharge status: Home / Self Care | Payer: PRIVATE HEALTH INSURANCE | Source: Ambulatory Visit | Attending: Cardiology | Admitting: Cardiology

## 2013-07-06 DIAGNOSIS — I517 Cardiomegaly: Secondary | ICD-10-CM

## 2013-07-06 DIAGNOSIS — I10 Essential (primary) hypertension: Secondary | ICD-10-CM | POA: Insufficient documentation

## 2013-07-06 DIAGNOSIS — E119 Type 2 diabetes mellitus without complications: Secondary | ICD-10-CM | POA: Insufficient documentation

## 2013-07-06 DIAGNOSIS — Z87891 Personal history of nicotine dependence: Secondary | ICD-10-CM | POA: Insufficient documentation

## 2013-07-06 DIAGNOSIS — R55 Syncope and collapse: Secondary | ICD-10-CM | POA: Insufficient documentation

## 2013-07-06 DIAGNOSIS — G20A1 Parkinson's disease without dyskinesia, without mention of fluctuations: Secondary | ICD-10-CM | POA: Insufficient documentation

## 2013-07-06 DIAGNOSIS — G20 Parkinson's disease: Secondary | ICD-10-CM | POA: Insufficient documentation

## 2013-07-06 NOTE — Progress Notes (Signed)
*  PRELIMINARY RESULTS* Echocardiogram 2D Echocardiogram has been performed.  Darrel Baroni 07/06/2013, 12:20 PM

## 2013-07-16 ENCOUNTER — Telehealth: Payer: Self-pay | Admitting: Cardiology

## 2013-07-16 NOTE — Telephone Encounter (Signed)
Pt nurse advised he has tried 3 different event monitors and always takes them off no matter what they try, noted placed on 07-01-13, this nurse contacted dr branch to advise per pt mental capacity to remove the monitor the pt facility is requesting this be removed, dr branch gave verbal order to discontinue the event monitor, nurse given orders verbally to discontinue, this nurse contacted Mitch with [employer] to advise the pt therapy is d/c, mitch will alert the company

## 2013-07-16 NOTE — Telephone Encounter (Signed)
States that patient is not going to be able to wear the heart monitor.  States that he keeps pulling the leads off. Please call if you have any questions. / tgs

## 2013-07-26 ENCOUNTER — Other Ambulatory Visit: Payer: Self-pay | Admitting: *Deleted

## 2013-07-26 DIAGNOSIS — R55 Syncope and collapse: Secondary | ICD-10-CM

## 2013-08-12 ENCOUNTER — Encounter: Payer: Self-pay | Admitting: Cardiology

## 2013-08-12 ENCOUNTER — Ambulatory Visit (INDEPENDENT_AMBULATORY_CARE_PROVIDER_SITE_OTHER): Payer: PRIVATE HEALTH INSURANCE | Admitting: Cardiology

## 2013-08-12 VITALS — BP 149/60 | HR 76 | Ht 60.0 in | Wt 173.0 lb

## 2013-08-12 DIAGNOSIS — G20A1 Parkinson's disease without dyskinesia, without mention of fluctuations: Secondary | ICD-10-CM

## 2013-08-12 DIAGNOSIS — R55 Syncope and collapse: Secondary | ICD-10-CM

## 2013-08-12 DIAGNOSIS — G20 Parkinson's disease: Secondary | ICD-10-CM | POA: Insufficient documentation

## 2013-08-12 DIAGNOSIS — R42 Dizziness and giddiness: Secondary | ICD-10-CM

## 2013-08-12 NOTE — Patient Instructions (Addendum)
Your physician recommends that you schedule a follow-up appointment in:  4 months with Dr Wyline Mood  Your physician has recommended you make the following change in your medication:  1. STOP HYTRIN

## 2013-08-12 NOTE — Progress Notes (Signed)
Clinical Summary Mr. Farnell is a 79 y.o.male  1. Presyncope:  - patient is poor historian, history is combination of his report and his nurses aid. He lives at [facility].  - at last visit patient reported a 1 month history of feeling lightheaded and dizzy, feels like going to pass out but doesn't. No chest pain. Reports some SOB and palpitaitons with episodes. Reports heart feels like its "thumping".   - seems to occur more often when on his feet. He was found to be orthostatic in clinic - at last visit ordered an event monitor which the patient was not compliant with, per nursing staff he continuously pulled the monitor off. We also stopped his imdur and asked for a bp log, which was completed but has not been sent in yet.   - patient reports improved symptoms since last visit   Past Medical History  Diagnosis Date  . Essential hypertension, benign   . Type 2 diabetes mellitus   . Dementia   . Parkinson's disease      No Known Allergies   Current Outpatient Prescriptions  Medication Sig Dispense Refill  . aspirin EC 81 MG tablet Take 81 mg by mouth daily.        . clotrimazole-betamethasone (LOTRISONE) cream Apply 1 application topically 2 (two) times daily as needed. Itching        . escitalopram (LEXAPRO) 10 MG tablet Take 10 mg by mouth daily.      . haloperidol (HALDOL) 1 MG tablet Take 1 mg by mouth at bedtime.        . hydrocortisone 2.5 % cream Apply 1 application topically 3 (three) times daily.        . hydrOXYzine (ATARAX) 25 MG tablet Take 25 mg by mouth every 6 (six) hours as needed. itching       . insulin aspart (NOVOLOG) 100 UNIT/ML injection Inject 10 Units into the skin 3 (three) times daily before meals. If blood sugar is 70 or above 300 call MD 150-200 2 units(total 12 units) 201-250  3 units(total 13 units) 251-300  4 units(total 14 units) 301- 350 5 units (total 15 units) 351-400 6 units(total 16 units) Greater than 400 7 units  (total 17 units)         . insulin detemir (LEVEMIR) 100 UNIT/ML injection Inject 30 Units into the skin at bedtime.        Marland Kitchen linagliptin (TRADJENTA) 5 MG TABS tablet Take 5 mg by mouth daily.      . sitaGLIPtin (JANUVIA) 50 MG tablet Take 50 mg by mouth daily.        . Tamsulosin HCl (FLOMAX) 0.4 MG CAPS Take 0.4 mg by mouth daily.        Marland Kitchen terazosin (HYTRIN) 5 MG capsule Take 5 mg by mouth daily.        . Thiamine HCl (VITAMIN B-1) 100 MG tablet Take 100 mg by mouth daily.         No current facility-administered medications for this visit.     No past surgical history on file.   No Known Allergies    No family history on file.   Social History Mr. Want reports that he has quit smoking. His smoking use included Cigarettes. He has a 2 pack-year smoking history. He has never used smokeless tobacco. Mr. Riker reports that he does not drink alcohol.   Review of Systems CONSTITUTIONAL: No weight loss, fever, chills, weakness or fatigue.  HEENT: Eyes: No visual loss, blurred vision, double vision or yellow sclerae.No hearing loss, sneezing, congestion, runny nose or sore throat.  SKIN: No rash or itching.  CARDIOVASCULAR: per HPI RESPIRATORY: No shortness of breath, cough or sputum.  GASTROINTESTINAL: No anorexia, nausea, vomiting or diarrhea. No abdominal pain or blood.  GENITOURINARY: No burning on urination, no polyuria NEUROLOGICAL: per HPI MUSCULOSKELETAL: No muscle, back pain, joint pain or stiffness.  LYMPHATICS: No enlarged nodes. No history of splenectomy.  PSYCHIATRIC: No history of depression or anxiety.  ENDOCRINOLOGIC: No reports of sweating, cold or heat intolerance. No polyuria or polydipsia.  Marland Kitchen   Physical Examination p 76 bp 149/60 Wt 173 lbs BMI 29 Gen: resting comfortably, no acute distress HEENT: no scleral icterus, pupils equal round and reactive, no palptable cervical adenopathy,  CV: RRR, no m/r/g, no JVD, no carotid bruits Resp: Clear to  auscultation bilaterally GI: abdomen is soft, non-tender, non-distended, normal bowel sounds, no hepatosplenomegaly MSK: extremities are warm, no edema.  Skin: warm, no rash Neuro:  no focal deficits Psych: appropriate affect   Diagnostic Studies 05/2013 EKG: sinus, rate 70, LAFB, non-spec conduction delay, 1st degree heart block, LAE, non-spec ST-T  06/2013 Event monitor: difficulty with compliance due to mental status, no symptoms reported, no significant arrhythmias.   07/06/13 Echo: LVEF 60-65%, grade I diastolic dysfunction,   Assessment and Plan  1. Near syncope: difficult to to identify the etiology, the patient is a poor historian with multiple medical comorbidities.  - likely orthostatic dizziness due to combination of medication regimen, as well as underlying parkinsons which can cause some autonomic dysfunction - symptoms improved off imdur, he also of note is on 2 alpha blockers, I have stopped his hytrin and continued his flomax - the nursing home is to send in his bp log - he had described some palpitations during the episodes and thus an event monitor was ordered, however due to his mental status he continously pulled off the monitor. He does not complain of any further palpitations or symptoms, will not pursue further workup at this time for arrhythmia.         Antoine Poche, M.D., F.A.C.C.

## 2014-11-14 DIAGNOSIS — Z9181 History of falling: Secondary | ICD-10-CM

## 2014-11-14 HISTORY — DX: History of falling: Z91.81

## 2014-12-05 ENCOUNTER — Encounter (HOSPITAL_COMMUNITY): Payer: Self-pay

## 2014-12-05 ENCOUNTER — Inpatient Hospital Stay (HOSPITAL_COMMUNITY)
Admission: EM | Admit: 2014-12-05 | Discharge: 2014-12-08 | DRG: 871 | Disposition: A | Discharge status: Skilled Nursing Facility | Payer: Medicare Other | Attending: Internal Medicine | Admitting: Internal Medicine

## 2014-12-05 ENCOUNTER — Emergency Department (HOSPITAL_COMMUNITY): Payer: Medicare Other

## 2014-12-05 DIAGNOSIS — G4700 Insomnia, unspecified: Secondary | ICD-10-CM

## 2014-12-05 DIAGNOSIS — N400 Enlarged prostate without lower urinary tract symptoms: Secondary | ICD-10-CM | POA: Diagnosis present

## 2014-12-05 DIAGNOSIS — Z22322 Carrier or suspected carrier of Methicillin resistant Staphylococcus aureus: Secondary | ICD-10-CM

## 2014-12-05 DIAGNOSIS — F329 Major depressive disorder, single episode, unspecified: Secondary | ICD-10-CM | POA: Diagnosis present

## 2014-12-05 DIAGNOSIS — F0390 Unspecified dementia without behavioral disturbance: Secondary | ICD-10-CM | POA: Diagnosis present

## 2014-12-05 DIAGNOSIS — R059 Cough, unspecified: Secondary | ICD-10-CM

## 2014-12-05 DIAGNOSIS — R05 Cough: Secondary | ICD-10-CM

## 2014-12-05 DIAGNOSIS — I10 Essential (primary) hypertension: Secondary | ICD-10-CM | POA: Diagnosis present

## 2014-12-05 DIAGNOSIS — A419 Sepsis, unspecified organism: Secondary | ICD-10-CM | POA: Diagnosis present

## 2014-12-05 DIAGNOSIS — E119 Type 2 diabetes mellitus without complications: Secondary | ICD-10-CM

## 2014-12-05 DIAGNOSIS — E860 Dehydration: Secondary | ICD-10-CM | POA: Diagnosis present

## 2014-12-05 DIAGNOSIS — Z79899 Other long term (current) drug therapy: Secondary | ICD-10-CM

## 2014-12-05 DIAGNOSIS — R509 Fever, unspecified: Secondary | ICD-10-CM | POA: Diagnosis present

## 2014-12-05 DIAGNOSIS — Z87891 Personal history of nicotine dependence: Secondary | ICD-10-CM

## 2014-12-05 DIAGNOSIS — Z7982 Long term (current) use of aspirin: Secondary | ICD-10-CM

## 2014-12-05 DIAGNOSIS — J1100 Influenza due to unidentified influenza virus with unspecified type of pneumonia: Secondary | ICD-10-CM | POA: Diagnosis present

## 2014-12-05 DIAGNOSIS — IMO0001 Reserved for inherently not codable concepts without codable children: Secondary | ICD-10-CM | POA: Diagnosis present

## 2014-12-05 DIAGNOSIS — E1159 Type 2 diabetes mellitus with other circulatory complications: Secondary | ICD-10-CM

## 2014-12-05 DIAGNOSIS — Z794 Long term (current) use of insulin: Secondary | ICD-10-CM

## 2014-12-05 DIAGNOSIS — G20 Parkinson's disease: Secondary | ICD-10-CM | POA: Diagnosis present

## 2014-12-05 DIAGNOSIS — R55 Syncope and collapse: Secondary | ICD-10-CM | POA: Diagnosis present

## 2014-12-05 LAB — COMPREHENSIVE METABOLIC PANEL
ALBUMIN: 3.7 g/dL (ref 3.5–5.2)
ALT: 20 U/L (ref 0–53)
AST: 21 U/L (ref 0–37)
Alkaline Phosphatase: 75 U/L (ref 39–117)
Anion gap: 5 (ref 5–15)
BUN: 28 mg/dL — ABNORMAL HIGH (ref 6–23)
CHLORIDE: 104 mmol/L (ref 96–112)
CO2: 25 mmol/L (ref 19–32)
Calcium: 8.2 mg/dL — ABNORMAL LOW (ref 8.4–10.5)
Creatinine, Ser: 1.21 mg/dL (ref 0.50–1.35)
GFR calc Af Amer: 64 mL/min — ABNORMAL LOW (ref >90)
GFR calc non Af Amer: 55 mL/min — ABNORMAL LOW (ref >90)
GLUCOSE: 191 mg/dL — AB (ref 70–99)
Potassium: 4.1 mmol/L (ref 3.5–5.1)
Sodium: 134 mmol/L — ABNORMAL LOW (ref 135–145)
TOTAL PROTEIN: 7.1 g/dL (ref 6.0–8.3)
Total Bilirubin: 0.3 mg/dL (ref 0.3–1.2)

## 2014-12-05 LAB — URINALYSIS, ROUTINE W REFLEX MICROSCOPIC
Bilirubin Urine: NEGATIVE
Glucose, UA: 250 mg/dL — AB
HGB URINE DIPSTICK: NEGATIVE
Ketones, ur: NEGATIVE mg/dL
Leukocytes, UA: NEGATIVE
Nitrite: NEGATIVE
Protein, ur: 30 mg/dL — AB
UROBILINOGEN UA: 0.2 mg/dL (ref 0.0–1.0)
pH: 5.5 (ref 5.0–8.0)

## 2014-12-05 LAB — CBC WITH DIFFERENTIAL/PLATELET
BASOS ABS: 0 10*3/uL (ref 0.0–0.1)
Basophils Relative: 0 % (ref 0–1)
EOS PCT: 3 % (ref 0–5)
Eosinophils Absolute: 0.2 10*3/uL (ref 0.0–0.7)
HEMATOCRIT: 39.5 % (ref 39.0–52.0)
Hemoglobin: 13 g/dL (ref 13.0–17.0)
LYMPHS ABS: 1.2 10*3/uL (ref 0.7–4.0)
Lymphocytes Relative: 14 % (ref 12–46)
MCH: 28.6 pg (ref 26.0–34.0)
MCHC: 32.9 g/dL (ref 30.0–36.0)
MCV: 86.8 fL (ref 78.0–100.0)
Monocytes Absolute: 0.7 10*3/uL (ref 0.1–1.0)
Monocytes Relative: 9 % (ref 3–12)
Neutro Abs: 6.4 10*3/uL (ref 1.7–7.7)
Neutrophils Relative %: 74 % (ref 43–77)
RBC: 4.55 MIL/uL (ref 4.22–5.81)
RDW: 12.8 % (ref 11.5–15.5)
WBC: 8.6 10*3/uL (ref 4.0–10.5)

## 2014-12-05 LAB — URINE MICROSCOPIC-ADD ON

## 2014-12-05 LAB — I-STAT CG4 LACTIC ACID, ED: Lactic Acid, Venous: 2.66 mmol/L (ref 0.5–2.0)

## 2014-12-05 MED ORDER — SODIUM CHLORIDE 0.9 % IV SOLN: INTRAVENOUS | Status: DC

## 2014-12-05 MED ORDER — TAMSULOSIN HCL 0.4 MG PO CAPS
0.4000 mg | ORAL_CAPSULE | Freq: Every day | ORAL | Status: DC
  Administered 2014-12-06 – 2014-12-08 (×3): 0.4 mg via ORAL
  Filled 2014-12-05 (×3): qty 1

## 2014-12-05 MED ORDER — HALOPERIDOL 2 MG PO TABS
1.0000 mg | ORAL_TABLET | Freq: Every day | ORAL | Status: DC
  Administered 2014-12-06 – 2014-12-07 (×3): 1 mg via ORAL
  Filled 2014-12-05 (×3): qty 1

## 2014-12-05 MED ORDER — ASPIRIN EC 81 MG PO TBEC
81.0000 mg | DELAYED_RELEASE_TABLET | Freq: Every day | ORAL | Status: DC
  Administered 2014-12-06 – 2014-12-08 (×3): 81 mg via ORAL
  Filled 2014-12-05 (×3): qty 1

## 2014-12-05 MED ORDER — MELATONIN 5 MG PO CAPS: 10.0000 mg | ORAL_CAPSULE | Freq: Every day | ORAL | Status: DC

## 2014-12-05 MED ORDER — ONDANSETRON HCL 4 MG/2ML IJ SOLN: 4.0000 mg | Freq: Four times a day (QID) | INTRAMUSCULAR | Status: DC | PRN

## 2014-12-05 MED ORDER — OSELTAMIVIR PHOSPHATE 75 MG PO CAPS
75.0000 mg | ORAL_CAPSULE | Freq: Two times a day (BID) | ORAL | Status: DC
  Administered 2014-12-06 – 2014-12-08 (×6): 75 mg via ORAL
  Filled 2014-12-05 (×6): qty 1

## 2014-12-05 MED ORDER — INSULIN DETEMIR 100 UNIT/ML ~~LOC~~ SOLN
25.0000 [IU] | Freq: Every day | SUBCUTANEOUS | Status: DC
  Administered 2014-12-06 – 2014-12-07 (×3): 25 [IU] via SUBCUTANEOUS
  Filled 2014-12-05 (×4): qty 0.25

## 2014-12-05 MED ORDER — SODIUM CHLORIDE 0.45 % IV SOLN
INTRAVENOUS | Status: DC
  Administered 2014-12-06 (×2): via INTRAVENOUS

## 2014-12-05 MED ORDER — ONDANSETRON HCL 4 MG PO TABS: 4.0000 mg | ORAL_TABLET | Freq: Four times a day (QID) | ORAL | Status: DC | PRN

## 2014-12-05 MED ORDER — ACETAMINOPHEN 650 MG RE SUPP: 650.0000 mg | Freq: Four times a day (QID) | RECTAL | Status: DC | PRN

## 2014-12-05 MED ORDER — IPRATROPIUM BROMIDE 0.06 % NA SOLN
2.0000 | Freq: Four times a day (QID) | NASAL | Status: DC | PRN
  Filled 2014-12-05: qty 15

## 2014-12-05 MED ORDER — HEPARIN SODIUM (PORCINE) 5000 UNIT/ML IJ SOLN
5000.0000 [IU] | Freq: Three times a day (TID) | INTRAMUSCULAR | Status: DC
  Administered 2014-12-05 – 2014-12-07 (×7): 5000 [IU] via SUBCUTANEOUS
  Filled 2014-12-05 (×7): qty 1

## 2014-12-05 MED ORDER — ESCITALOPRAM OXALATE 10 MG PO TABS
10.0000 mg | ORAL_TABLET | Freq: Every day | ORAL | Status: DC
  Administered 2014-12-06 – 2014-12-08 (×3): 10 mg via ORAL
  Filled 2014-12-05 (×3): qty 1

## 2014-12-05 MED ORDER — VANCOMYCIN HCL IN DEXTROSE 1-5 GM/200ML-% IV SOLN
1000.0000 mg | Freq: Once | INTRAVENOUS | Status: AC
Start: 2014-12-05 — End: 2014-12-05
  Administered 2014-12-05: 1000 mg via INTRAVENOUS
  Filled 2014-12-05: qty 200

## 2014-12-05 MED ORDER — SODIUM CHLORIDE 0.9 % IV BOLUS (SEPSIS)
250.0000 mL | Freq: Once | INTRAVENOUS | Status: AC
  Administered 2014-12-05: 250 mL via INTRAVENOUS

## 2014-12-05 MED ORDER — PIPERACILLIN-TAZOBACTAM 3.375 G IVPB 30 MIN
3.3750 g | Freq: Once | INTRAVENOUS | Status: AC
Start: 2014-12-05 — End: 2014-12-05
  Administered 2014-12-05: 3.375 g via INTRAVENOUS
  Filled 2014-12-05: qty 50

## 2014-12-05 MED ORDER — FLUTICASONE PROPIONATE 50 MCG/ACT NA SUSP
2.0000 | Freq: Every day | NASAL | Status: DC
  Administered 2014-12-06 – 2014-12-07 (×3): 2 via NASAL
  Filled 2014-12-05: qty 16

## 2014-12-05 MED ORDER — ACETAMINOPHEN 325 MG PO TABS
650.0000 mg | ORAL_TABLET | Freq: Four times a day (QID) | ORAL | Status: DC | PRN
  Administered 2014-12-06: 650 mg via ORAL
  Filled 2014-12-05: qty 2

## 2014-12-05 MED ORDER — HYDROXYZINE HCL 25 MG PO TABS: 25.0000 mg | ORAL_TABLET | Freq: Four times a day (QID) | ORAL | Status: DC | PRN

## 2014-12-05 MED ORDER — ACETAMINOPHEN 325 MG PO TABS
650.0000 mg | ORAL_TABLET | Freq: Once | ORAL | Status: AC
  Administered 2014-12-05: 650 mg via ORAL
  Filled 2014-12-05: qty 2

## 2014-12-05 MED ORDER — INSULIN ASPART 100 UNIT/ML ~~LOC~~ SOLN
0.0000 [IU] | Freq: Three times a day (TID) | SUBCUTANEOUS | Status: DC
  Administered 2014-12-06: 2 [IU] via SUBCUTANEOUS
  Administered 2014-12-06: 3 [IU] via SUBCUTANEOUS
  Administered 2014-12-06 – 2014-12-07 (×2): 1 [IU] via SUBCUTANEOUS
  Administered 2014-12-07: 3 [IU] via SUBCUTANEOUS
  Administered 2014-12-08: 2 [IU] via SUBCUTANEOUS

## 2014-12-05 MED ORDER — SODIUM CHLORIDE 0.9 % IJ SOLN
3.0000 mL | Freq: Two times a day (BID) | INTRAMUSCULAR | Status: DC
  Administered 2014-12-06 (×3): 3 mL via INTRAVENOUS

## 2014-12-05 MED ORDER — OXYCODONE HCL 5 MG PO TABS
5.0000 mg | ORAL_TABLET | ORAL | Status: DC | PRN
Start: 2014-12-05 — End: 2014-12-08

## 2014-12-05 NOTE — ED Notes (Signed)
Attempted to call report nurse not aware she was getting another pt. To call back.

## 2014-12-05 NOTE — ED Notes (Signed)
Pt here from [facility] for evaluation of cough and sinus congestion

## 2014-12-05 NOTE — H&P (Signed)
[facility] History and Physical  [name]:[id number] DOB: December 16, 1932 DOA: 12/05/2014  Referring physician: Dr Deretha Emory - [facility] PCP: No primary care provider on file.   Chief Complaint: Cough  HPI: Cody [name] is a 81 y.o. male  Level 5 Caveat. Patient demented.  Pt presenting form [facility]. Call to discuss patient's case with nursing home facility and spoke to the nurse who cares for the patient today and called for medical transport. Staff member reports patient has had one-day history of cough, congestion, runny nose. Denies any complaints of chest pain, palpitations, shortness of breath, fevers, diarrhea, abdominal pain, rash. Reports that today while assisting patient up from toilet patient started coughing episode which resulted in patient passing out for a few seconds. No reported seizure type activity or loss of bowel or bladder function during this time. No actual fall or head trauma. Patient was assisted back down to toilet until able to wake up. No other change in typical physical or mental condition recently.   Review of Systems:  Demented and unable to reliably participate  Past Medical History  Diagnosis Date  . Essential hypertension, benign   . Type 2 diabetes mellitus   . Dementia   . Parkinson's disease    History reviewed. No pertinent past surgical history. Social History:  reports that he has quit smoking. His smoking use included Cigarettes. He has a 2 pack-year smoking history. He has never used smokeless tobacco. He reports that he does not drink alcohol or use illicit drugs.  No Known Allergies  No family history on file.   Prior to Admission medications   Medication Sig Start Date End Date Taking? Authorizing Provider  aspirin EC 81 MG tablet Take 81 mg by mouth daily.     Yes Historical Provider, MD  Camphor-Eucalyptus-Menthol (VICKS VAPORUB EX) Apply 1 application topically 2 (two) times daily. *Applied to each  affected fungal nail   Yes Historical Provider, MD  clotrimazole-betamethasone (LOTRISONE) cream Apply 1 application topically 2 (two) times daily as needed. Itching     Yes Historical Provider, MD  escitalopram (LEXAPRO) 10 MG tablet Take 10 mg by mouth daily.   Yes Historical Provider, MD  fluticasone (FLONASE) 50 MCG/ACT nasal spray Place into both nostrils daily.   Yes Historical Provider, MD  haloperidol (HALDOL) 1 MG tablet Take 1 mg by mouth at bedtime.     Yes Historical Provider, MD  hydrOXYzine (ATARAX) 25 MG tablet Take 25 mg by mouth every 6 (six) hours as needed. itching    Yes Historical Provider, MD  insulin aspart (NOVOLOG) 100 UNIT/ML injection Inject 10 Units into the skin 3 (three) times daily before meals. If blood sugar is 70 or above 300 call MD 150-200= 1 unit added 201-250= 2 unit added 251-300= 3 unit added 301-350= 4 unit added 351-400= 5 unit added >400= 6 units  301- 350 5 units (total 15 units) 351-400 6 units(total 16 units) Greater than 400 7 units (total 17 units)   Yes Historical Provider, MD  insulin detemir (LEVEMIR) 100 UNIT/ML injection Inject 50 Units into the skin at bedtime.    Yes Historical Provider, MD  linagliptin (TRADJENTA) 5 MG TABS tablet Take 5 mg by mouth daily.   Yes Historical Provider, MD  loratadine (CLARITIN) 10 MG tablet Take 10 mg by mouth daily.   Yes Historical Provider, MD  Melatonin 5 MG CAPS Take 10 mg by mouth at bedtime.   Yes Historical Provider, MD  Tamsulosin HCl (FLOMAX) 0.4 MG CAPS Take 0.4 mg by mouth daily.     Yes Historical Provider, MD  Thiamine HCl (VITAMIN B-1) 100 MG tablet Take 100 mg by mouth daily.     Yes Historical Provider, MD   Physical Exam: Filed Vitals:   12/05/14 2030 12/05/14 2100 12/05/14 2115 12/05/14 2137  BP: 121/47 131/58    Pulse: 70 69    Temp:    99.4 F (37.4 C)  TempSrc:    Oral  Resp: 24 26    Height:   5\' 2"  (1.575 m)   Weight:   72.576 kg (160 lb)   SpO2: 95% 96%      Wt  Readings from Last 3 Encounters:  12/05/14 72.576 kg (160 lb)  08/12/13 78.472 kg (173 lb)  07/01/13 78.019 kg (172 lb)    General:  Appears calm and comfortable Eyes: L eye missing.  ENT: dry mucus membranes Neck:  no LAD, masses or thyromegaly Cardiovascular: faint heart sounds, II/VI systolic murmur. No LE edema. Respiratory: few crackles in lung bases bilat, no w/r/r. Normal respiratory effort. Abdomen:  soft, ntnd Skin:  no rash or induration seen on limited exam Musculoskeletal:  grossly normal tone BUE/BLE Psychiatric: Pt participates but does not answer questions appropriately Neurologic:  grossly non-focal.          Labs on Admission:  Basic Metabolic Panel:  Recent Labs Lab 12/05/14 1905  NA 134*  K 4.1  CL 104  CO2 25  GLUCOSE 191*  BUN 28*  CREATININE 1.21  CALCIUM 8.2*   Liver Function Tests:  Recent Labs Lab 12/05/14 1905  AST 21  ALT 20  ALKPHOS 75  BILITOT 0.3  PROT 7.1  ALBUMIN 3.7   No results for input(s): LIPASE, AMYLASE in the last 168 hours. No results for input(s): AMMONIA in the last 168 hours. CBC:  Recent Labs Lab 12/05/14 1905  WBC 8.6  NEUTROABS 6.4  HGB 13.0  HCT 39.5  MCV 86.8  PLT SPECIMEN CHECKED FOR CLOTS   Cardiac Enzymes: No results for input(s): CKTOTAL, CKMB, CKMBINDEX, TROPONINI in the last 168 hours.  BNP (last 3 results) No results for input(s): BNP in the last 8760 hours.  ProBNP (last 3 results) No results for input(s): PROBNP in the last 8760 hours.  CBG: No results for input(s): GLUCAP in the last 168 hours.  Radiological Exams on Admission: Dg Chest 1 View  12/05/2014   CLINICAL DATA:  Cough and sinus congestion.  EXAM: CHEST  1 VIEW  COMPARISON:  05/26/2013  FINDINGS: Shallow inspiration with elevation of right hemidiaphragm. The heart size and mediastinal contours are within normal limits. Both lungs are clear. The visualized skeletal structures are unremarkable.  IMPRESSION: No active disease.    Electronically Signed   By: Burman NievesWilliam  Stevens M.D.   On: 12/05/2014 18:57    EKG: none  Assessment/Plan Principal Problem:   Syncope Active Problems:   Essential hypertension, benign   Fever   Febrile illness, acute   BPH (benign prostatic hyperplasia)   Insomnia   Dementia   Diabetes mellitus without complication  Syncopal episode: Likely multifactorial secondary to vasovagal episode, mild dehydration and URI infection. Unlikely related to stroke, seizure, or other metabolic derangement.  - Admit  - Telemetry - EKG   Febrile illness: Likely secondary to viral URI versus flu. White blood cell count normal. Lactic acid 2.66 worrisome. Urinalysis without overt sign of infection. Chest x-ray without sign of acute process. Febrile but  other vital signs normal. Given vancomycin in ED - Start Tamiflu - zofran - Respiratory viral panel - Repeat lactic acid In the morning - Stop vancomycin and Zosyn - Blood culture - Urine culture - Continue home Flonase - nasal atrovent - tylenol, ibuprofen  Dementia: Advanced. At baseline per nursing home staff. -Continue Haldol  Diabetes type 2: No previous A1c. On Tradjenta, Levemir, and sliding scale insulin - Sliding-scale insulin - Half Levemir dose  Insomnia: -Continue melatonin   BPH: continue flomax  Depression: -Patient continue Lexapro  Respiratory panel  Code Status: FULL DVT Prophylaxis: Hep Family Communication: None. Discussed case w/ NH staff Disposition Plan: pending improvement  Ruhan Borak Shela Commons, MD Family Medicine [facility] [email or web address] Password [email or web address]

## 2014-12-05 NOTE — Progress Notes (Signed)
Pharmacy Note:  Initial antibiotics for Vancomycin and Zosyn ordered by EDP for Sepsis.  CrCl cannot be calculated (Unknown ideal weight.).   No Known Allergies  Filed Vitals:   12/05/14 2100  BP: 131/58  Pulse: 69  Temp:   Resp: 26    Anti-infectives    Start     Dose/Rate Route Frequency Ordered Stop   12/05/14 2115  piperacillin-tazobactam (ZOSYN) IVPB 3.375 g     3.375 g 100 mL/hr over 30 Minutes Intravenous  Once 12/05/14 2104     12/05/14 2115  vancomycin (VANCOCIN) IVPB 1000 mg/200 mL premix     1,000 mg 200 mL/hr over 60 Minutes Intravenous  Once 12/05/14 2104        Plan: Initial doses of Vancomycin 1gm and Zosyn 3.375gm X 1 ordered. F/U admission orders for further dosing if therapy continued.  Mady GemmaHayes, Sheran Newstrom R, [facility] Harvard HospitalRPH 12/05/2014 9:13 PM

## 2014-12-05 NOTE — ED Notes (Signed)
Called [facility] w/ carlinia, (med tech) & advised pt was being admitted to hospital.

## 2014-12-05 NOTE — ED Provider Notes (Signed)
CSN: [id number]     Arrival date & time 12/05/14  1815 History   First MD Initiated Contact with Patient 12/05/14 1826     Chief Complaint  Patient presents with  . Cough     (Consider location/radiation/quality/duration/timing/severity/associated sxs/prior Treatment) Patient is a 81 y.o. male presenting with cough. The history is provided by the patient and the nursing home. The history is limited by the condition of the patient.  Cough Associated symptoms: fever    level V caveat applies to the history due to the patient's dementia.  Patient sent in from [facility] nursing facility for evaluation of cough and sinus congestion. Patient with significant fever here upon arrival 103. Patient has multiple complaints but hard to tell if it's accurate because he kind of answers yes to everything but seem to be more concerned about having a cold.    Past Medical History  Diagnosis Date  . Essential hypertension, benign   . Type 2 diabetes mellitus   . Dementia   . Parkinson's disease    History reviewed. No pertinent past surgical history. No family history on file. History  Substance Use Topics  . Smoking status: Former Smoker -- 0.50 packs/day for 4 years    Types: Cigarettes  . Smokeless tobacco: Never Used  . Alcohol Use: No    Review of Systems  Unable to perform ROS Constitutional: Positive for fever.  HENT: Positive for congestion.   Respiratory: Positive for cough.    otherwise level V caveat applies the review of systems due to the patient's dementia.    Allergies  Review of patient's allergies indicates no known allergies.  Home Medications   Prior to Admission medications   Medication Sig Start Date End Date Taking? Authorizing Provider  aspirin EC 81 MG tablet Take 81 mg by mouth daily.     Yes Historical Provider, MD  Camphor-Eucalyptus-Menthol (VICKS VAPORUB EX) Apply 1 application topically 2 (two) times daily. *Applied to each affected fungal nail   Yes  Historical Provider, MD  clotrimazole-betamethasone (LOTRISONE) cream Apply 1 application topically 2 (two) times daily as needed. Itching     Yes Historical Provider, MD  escitalopram (LEXAPRO) 10 MG tablet Take 10 mg by mouth daily.   Yes Historical Provider, MD  fluticasone (FLONASE) 50 MCG/ACT nasal spray Place into both nostrils daily.   Yes Historical Provider, MD  haloperidol (HALDOL) 1 MG tablet Take 1 mg by mouth at bedtime.     Yes Historical Provider, MD  hydrOXYzine (ATARAX) 25 MG tablet Take 25 mg by mouth every 6 (six) hours as needed. itching    Yes Historical Provider, MD  insulin aspart (NOVOLOG) 100 UNIT/ML injection Inject 10 Units into the skin 3 (three) times daily before meals. If blood sugar is 70 or above 300 call MD 150-200= 1 unit added 201-250= 2 unit added 251-300= 3 unit added 301-350= 4 unit added 351-400= 5 unit added >400= 6 units  301- 350 5 units (total 15 units) 351-400 6 units(total 16 units) Greater than 400 7 units (total 17 units)   Yes Historical Provider, MD  insulin detemir (LEVEMIR) 100 UNIT/ML injection Inject 50 Units into the skin at bedtime.    Yes Historical Provider, MD  linagliptin (TRADJENTA) 5 MG TABS tablet Take 5 mg by mouth daily.   Yes Historical Provider, MD  loratadine (CLARITIN) 10 MG tablet Take 10 mg by mouth daily.   Yes Historical Provider, MD  Melatonin 5 MG CAPS Take 10 mg by  mouth at bedtime.   Yes Historical Provider, MD  Tamsulosin HCl (FLOMAX) 0.4 MG CAPS Take 0.4 mg by mouth daily.     Yes Historical Provider, MD  Thiamine HCl (VITAMIN B-1) 100 MG tablet Take 100 mg by mouth daily.     Yes Historical Provider, MD   BP 131/58 mmHg  Pulse 69  Temp(Src) 102.7 F (39.3 C) (Oral)  Resp 26  Ht 5\' 2"  (1.575 m)  Wt 160 lb (72.576 kg)  BMI 29.26 kg/m2  SpO2 96% Physical Exam  Constitutional: He appears well-developed and well-nourished.  HENT:  Head: Normocephalic and atraumatic.  Eyes:  Left eye with abnormality.   Neck: Normal range of motion.  Cardiovascular: Normal rate, regular rhythm and normal heart sounds.   Pulmonary/Chest: Effort normal and breath sounds normal. No respiratory distress.  Abdominal: Soft. Bowel sounds are normal. There is no tenderness.  Musculoskeletal: Normal range of motion.  Neurological: He is alert. No cranial nerve deficit. He exhibits normal muscle tone. Coordination normal.  Skin: Skin is warm. No rash noted.  Nursing note and vitals reviewed.   ED Course  Procedures (including critical care time) Labs Review Labs Reviewed  COMPREHENSIVE METABOLIC PANEL - Abnormal; Notable for the following:    Sodium 134 (*)    Glucose, Bld 191 (*)    BUN 28 (*)    Calcium 8.2 (*)    GFR calc non Af Amer 55 (*)    GFR calc Af Amer 64 (*)    All other components within normal limits  URINALYSIS, ROUTINE W REFLEX MICROSCOPIC - Abnormal; Notable for the following:    Specific Gravity, Urine >1.030 (*)    Glucose, UA 250 (*)    Protein, ur 30 (*)    All other components within normal limits  URINE MICROSCOPIC-ADD ON - Abnormal; Notable for the following:    Bacteria, UA FEW (*)    All other components within normal limits  I-STAT CG4 LACTIC ACID, ED - Abnormal; Notable for the following:    Lactic Acid, Venous 2.66 (*)    All other components within normal limits  CULTURE, BLOOD (ROUTINE X 2)  CULTURE, BLOOD (ROUTINE X 2)  URINE CULTURE  CBC WITH DIFFERENTIAL/PLATELET  INFLUENZA PANEL BY PCR (TYPE A & B, H1N1)   Results for orders placed or performed during the hospital encounter of 12/05/14  Culture, blood (routine x 2)  Result Value Ref Range   Specimen Description BLOOD LEFT HAND    Special Requests BOTTLES DRAWN AEROBIC AND ANAEROBIC 8CC EACH    Culture PENDING    Report Status PENDING   Culture, blood (routine x 2)  Result Value Ref Range   Specimen Description BLOOD LEFT ARM    Special Requests BOTTLES DRAWN AEROBIC AND ANAEROBIC 8CC EACH    Culture  PENDING    Report Status PENDING   Comprehensive metabolic panel  Result Value Ref Range   Sodium 134 (L) 135 - 145 mmol/L   Potassium 4.1 3.5 - 5.1 mmol/L   Chloride 104 96 - 112 mmol/L   CO2 25 19 - 32 mmol/L   Glucose, Bld 191 (H) 70 - 99 mg/dL   BUN 28 (H) 6 - 23 mg/dL   Creatinine, Ser 1.611.21 0.50 - 1.35 mg/dL   Calcium 8.2 (L) 8.4 - 10.5 mg/dL   Total Protein 7.1 6.0 - 8.3 g/dL   Albumin 3.7 3.5 - 5.2 g/dL   AST 21 0 - 37 U/L   ALT 20 0 -  53 U/L   Alkaline Phosphatase 75 39 - 117 U/L   Total Bilirubin 0.3 0.3 - 1.2 mg/dL   GFR calc non Af Amer 55 (L) >90 mL/min   GFR calc Af Amer 64 (L) >90 mL/min   Anion gap 5 5 - 15  CBC with Differential/Platelet  Result Value Ref Range   WBC 8.6 4.0 - 10.5 K/uL   RBC 4.55 4.22 - 5.81 MIL/uL   Hemoglobin 13.0 13.0 - 17.0 g/dL   HCT 16.1 09.6 - 04.5 %   MCV 86.8 78.0 - 100.0 fL   MCH 28.6 26.0 - 34.0 pg   MCHC 32.9 30.0 - 36.0 g/dL   RDW 40.9 81.1 - 91.4 %   Platelets SPECIMEN CHECKED FOR CLOTS 150 - 400 K/uL   Neutrophils Relative % 74 43 - 77 %   Neutro Abs 6.4 1.7 - 7.7 K/uL   Lymphocytes Relative 14 12 - 46 %   Lymphs Abs 1.2 0.7 - 4.0 K/uL   Monocytes Relative 9 3 - 12 %   Monocytes Absolute 0.7 0.1 - 1.0 K/uL   Eosinophils Relative 3 0 - 5 %   Eosinophils Absolute 0.2 0.0 - 0.7 K/uL   Basophils Relative 0 0 - 1 %   Basophils Absolute 0.0 0.0 - 0.1 K/uL   WBC Morphology ATYPICAL LYMPHOCYTES   Urinalysis, Routine w reflex microscopic  Result Value Ref Range   Color, Urine YELLOW YELLOW   APPearance CLEAR CLEAR   Specific Gravity, Urine >1.030 (H) 1.005 - 1.030   pH 5.5 5.0 - 8.0   Glucose, UA 250 (A) NEGATIVE mg/dL   Hgb urine dipstick NEGATIVE NEGATIVE   Bilirubin Urine NEGATIVE NEGATIVE   Ketones, ur NEGATIVE NEGATIVE mg/dL   Protein, ur 30 (A) NEGATIVE mg/dL   Urobilinogen, UA 0.2 0.0 - 1.0 mg/dL   Nitrite NEGATIVE NEGATIVE   Leukocytes, UA NEGATIVE NEGATIVE  Urine microscopic-add on  Result Value Ref Range    WBC, UA 0-2 <3 WBC/hpf   RBC / HPF 0-2 <3 RBC/hpf   Bacteria, UA FEW (A) RARE  I-Stat CG4 Lactic Acid, ED  Result Value Ref Range   Lactic Acid, Venous 2.66 (HH) 0.5 - 2.0 mmol/L     Imaging Review Dg Chest 1 View  12/05/2014   CLINICAL DATA:  Cough and sinus congestion.  EXAM: CHEST  1 VIEW  COMPARISON:  05/26/2013  FINDINGS: Shallow inspiration with elevation of right hemidiaphragm. The heart size and mediastinal contours are within normal limits. Both lungs are clear. The visualized skeletal structures are unremarkable.  IMPRESSION: No active disease.   Electronically Signed   By: Burman Nieves M.D.   On: 12/05/2014 18:57     EKG Interpretation None      MDM   Final diagnoses:  Fever, unspecified fever cause    Patient from nursing facility has a history of dementia. And Parkinson's disease. Type 2 diabetes. From [facility] they sent him for evaluation of cough and sinus congestion. Upon arrival patient had significant fever 103. Patient without hypotension and not tachycardic. The pre-sepsis was of concern. Patient had blood cultures and urine cultures done. Lactic acid is elevated greater than 2.2 and less than 3 at 2.66. Chest x-rays negative for pneumonia. Fever did not improve significantly with Tylenol temp still 102.7. Patient urine appears fairly concentrated consistent with some dehydration. BUN and creatinine is pre-much baseline for patient. No significant leukocytosis. This could be viral influenza. Influenza testing done. And is pending. Patient  started on the sepsis antibiotics following culture. Patient will be admitted by the hospitalist.    Vanetta Mulders, MD 12/05/14 2133

## 2014-12-06 DIAGNOSIS — A419 Sepsis, unspecified organism: Principal | ICD-10-CM

## 2014-12-06 DIAGNOSIS — G20 Parkinson's disease: Secondary | ICD-10-CM | POA: Diagnosis present

## 2014-12-06 DIAGNOSIS — J1100 Influenza due to unidentified influenza virus with unspecified type of pneumonia: Secondary | ICD-10-CM

## 2014-12-06 DIAGNOSIS — I10 Essential (primary) hypertension: Secondary | ICD-10-CM | POA: Diagnosis present

## 2014-12-06 DIAGNOSIS — F329 Major depressive disorder, single episode, unspecified: Secondary | ICD-10-CM | POA: Diagnosis present

## 2014-12-06 DIAGNOSIS — Z79899 Other long term (current) drug therapy: Secondary | ICD-10-CM | POA: Diagnosis not present

## 2014-12-06 DIAGNOSIS — IMO0001 Reserved for inherently not codable concepts without codable children: Secondary | ICD-10-CM | POA: Diagnosis present

## 2014-12-06 DIAGNOSIS — Z22322 Carrier or suspected carrier of Methicillin resistant Staphylococcus aureus: Secondary | ICD-10-CM | POA: Diagnosis not present

## 2014-12-06 DIAGNOSIS — Z794 Long term (current) use of insulin: Secondary | ICD-10-CM | POA: Diagnosis not present

## 2014-12-06 DIAGNOSIS — Z7982 Long term (current) use of aspirin: Secondary | ICD-10-CM | POA: Diagnosis not present

## 2014-12-06 DIAGNOSIS — F0390 Unspecified dementia without behavioral disturbance: Secondary | ICD-10-CM | POA: Diagnosis present

## 2014-12-06 DIAGNOSIS — G4700 Insomnia, unspecified: Secondary | ICD-10-CM | POA: Diagnosis present

## 2014-12-06 DIAGNOSIS — R05 Cough: Secondary | ICD-10-CM | POA: Diagnosis present

## 2014-12-06 DIAGNOSIS — Z87891 Personal history of nicotine dependence: Secondary | ICD-10-CM | POA: Diagnosis not present

## 2014-12-06 DIAGNOSIS — N400 Enlarged prostate without lower urinary tract symptoms: Secondary | ICD-10-CM | POA: Diagnosis present

## 2014-12-06 DIAGNOSIS — E860 Dehydration: Secondary | ICD-10-CM | POA: Diagnosis present

## 2014-12-06 DIAGNOSIS — R509 Fever, unspecified: Secondary | ICD-10-CM | POA: Diagnosis present

## 2014-12-06 DIAGNOSIS — E119 Type 2 diabetes mellitus without complications: Secondary | ICD-10-CM | POA: Diagnosis present

## 2014-12-06 LAB — CBC
HEMATOCRIT: 36.3 % — AB (ref 39.0–52.0)
Hemoglobin: 11.8 g/dL — ABNORMAL LOW (ref 13.0–17.0)
MCH: 28.2 pg (ref 26.0–34.0)
MCHC: 32.5 g/dL (ref 30.0–36.0)
MCV: 86.8 fL (ref 78.0–100.0)
PLATELETS: 167 10*3/uL (ref 150–400)
RBC: 4.18 MIL/uL — ABNORMAL LOW (ref 4.22–5.81)
RDW: 12.9 % (ref 11.5–15.5)
WBC: 7.2 10*3/uL (ref 4.0–10.5)

## 2014-12-06 LAB — GLUCOSE, CAPILLARY
GLUCOSE-CAPILLARY: 232 mg/dL — AB (ref 70–99)
GLUCOSE-CAPILLARY: 124 mg/dL — AB (ref 70–99)
Glucose-Capillary: 200 mg/dL — ABNORMAL HIGH (ref 70–99)
Glucose-Capillary: 152 mg/dL — ABNORMAL HIGH (ref 70–99)

## 2014-12-06 LAB — COMPREHENSIVE METABOLIC PANEL
ALT: 18 U/L (ref 0–53)
AST: 18 U/L (ref 0–37)
Albumin: 3.2 g/dL — ABNORMAL LOW (ref 3.5–5.2)
Alkaline Phosphatase: 62 U/L (ref 39–117)
Anion gap: 7 (ref 5–15)
BUN: 23 mg/dL (ref 6–23)
CALCIUM: 8.1 mg/dL — AB (ref 8.4–10.5)
CO2: 27 mmol/L (ref 19–32)
CREATININE: 1.08 mg/dL (ref 0.50–1.35)
Chloride: 102 mmol/L (ref 96–112)
GFR calc Af Amer: 73 mL/min — ABNORMAL LOW (ref >90)
GFR calc non Af Amer: 63 mL/min — ABNORMAL LOW (ref >90)
Glucose, Bld: 236 mg/dL — ABNORMAL HIGH (ref 70–99)
Potassium: 3.9 mmol/L (ref 3.5–5.1)
SODIUM: 136 mmol/L (ref 135–145)
Total Bilirubin: 0.5 mg/dL (ref 0.3–1.2)
Total Protein: 6.2 g/dL (ref 6.0–8.3)

## 2014-12-06 LAB — INFLUENZA PANEL BY PCR (TYPE A & B)
H1N1 flu by pcr: NOT DETECTED
INFLAPCR: POSITIVE — AB
INFLBPCR: NEGATIVE

## 2014-12-06 LAB — LACTIC ACID, PLASMA: Lactic Acid, Venous: 1.5 mmol/L (ref 0.5–2.0)

## 2014-12-06 LAB — MRSA PCR SCREENING: MRSA BY PCR: POSITIVE — AB

## 2014-12-06 MED ORDER — MUPIROCIN 2 % EX OINT
1.0000 "application " | TOPICAL_OINTMENT | Freq: Two times a day (BID) | CUTANEOUS | Status: DC
  Administered 2014-12-06 – 2014-12-08 (×4): 1 via NASAL
  Filled 2014-12-06: qty 22

## 2014-12-06 MED ORDER — SIMETHICONE 40 MG/0.6ML PO SUSP
ORAL | Status: AC
  Filled 2014-12-06: qty 1.8

## 2014-12-06 MED ORDER — CHLORHEXIDINE GLUCONATE CLOTH 2 % EX PADS
6.0000 | MEDICATED_PAD | Freq: Every day | CUTANEOUS | Status: DC
  Administered 2014-12-07: 6 via TOPICAL

## 2014-12-06 MED ORDER — LEVOFLOXACIN IN D5W 750 MG/150ML IV SOLN
750.0000 mg | INTRAVENOUS | Status: DC
  Administered 2014-12-06 – 2014-12-07 (×2): 750 mg via INTRAVENOUS
  Filled 2014-12-06 (×2): qty 150

## 2014-12-06 NOTE — Care Management Note (Addendum)
    Page 1 of 1   12/08/2014     1:56:11 PM CARE MANAGEMENT NOTE 12/08/2014  Patient:  Cody [name],Cody [name]   Account Number:  [id number]  Date Initiated:  12/06/2014  Documentation initiated by:  Sharrie RothmanBLACKWELL,Earlin Sweeden C  Subjective/Objective Assessment:   [facility] and flu. Anticipate discharge back to facility at discharge.     Action/Plan:   CSW is aware and will arrange discharge to facility when medically stable.   Anticipated DC Date:  12/08/2014   Anticipated DC Plan:  ASSISTED LIVING / REST HOME      DC Planning Services  CM consult      [facility]   Choice offered to / List presented to:  C-1 Patient        HH arranged  HH-2 PT      HH agency  [facility]   Status of service:  Completed, signed off Medicare Important Message given?   (If response is "NO", the following Medicare IM given date fields will be blank) Date Medicare IM given:   Medicare IM given by:   Date Additional Medicare IM given:   Additional Medicare IM given by:    Discharge Disposition:  ASSISTED LIVING  Per UR Regulation:    If discussed at Long Length of Stay Meetings, dates discussed:    Comments:  12/08/14 1350 Cody Queenammy Kerilyn Cortner, RN BSN CM [facility] (per pts choice). Alroy BailiffLinda Lothian of [facility] is aware and will collect the pt information from the chart. HH services to start within 48 hours of discharge. No DME needs noted as pt has a rolling walker at the facility. [facility]. Pt and pts nurse aware of discharge arrangements.  12/06/14 1330 Cody Queenammy Sarika Baldini, RN BSN CM

## 2014-12-06 NOTE — Clinical Social Work Psychosocial (Signed)
Clinical Social Work Department BRIEF PSYCHOSOCIAL ASSESSMENT 12/06/2014  Patient:  Montel CulverBELTON,Summer E     Account Number:  [id number]     Admit date:  12/05/2014  Clinical Social Worker:  Nancie NeasSTULTZ,Eline Geng, LCSW  Date/Time:  12/06/2014 11:31 AM  Referred by:  CSW  Date Referred:  12/06/2014 Referred for  ALF Placement   Other Referral:   Interview type:  Other - See comment Other interview type:   Darel HongJudy- facility    PSYCHOSOCIAL DATA Living Status:  FACILITY [facility] Level of care:  Assisted Living Primary support name:   Primary support relationship to patient:   Degree of support available:   none outside of facility    CURRENT CONCERNS Current Concerns  Post-Acute Placement   Other Concerns:    SOCIAL WORK ASSESSMENT / PLAN CSW spoke with [facility]Jud[facility] as pt is oriented to self only at baseline. Darel HongJudy reports that pt has been a resident there since August 2012. Facility never sees any visitors for pt and they do not have a contact for pt. Contact list here shows Richardean ChimeraDonna Coffin, but number does not work. Darel HongJudy reports they will discuss pursuing guardianship for pt. Pt has advanced dementia. Pt came to ED due to cough and congestion. Facility notified influenza positive. At baseline, pt requires assist with bathing, dressing, and toileting. He ambulates independently with a walker. No home health prior to admission and pt is okay to return at d/c.   Assessment/plan status:  Psychosocial Support/Ongoing Assessment of Needs Other assessment/ plan:   Information/referral to community resources[facility]    PATIENT'S/FAMILY'S RESPONSE TO PLAN OF CARE: Pt unable to participate in assessment due to dementia. Anticip[facility] when medically stable.       Derenda FennelKara Bintou Lafata, Kentucky[place]CSW [phone]

## 2014-12-06 NOTE — Progress Notes (Signed)
.   PT Cancellation Note  Patient Details Name: Cody CulverRussell E [name] MRN: [id number] DOB: 31-Jan-1933   Cancelled Treatment:    Reason Eval/Treat Not Completed: Fatigue/lethargy limiting ability to participate.  Pt currently has a fever.  He is awake and extremely pleasant but doesn't feel well enough to participate in PT today.  We will plan to see him tomorrow and do not anticipate that he will have any functional mobility problems.   Myrlene BrokerBrown, Ski Polich L 12/06/2014, 11:09 AM

## 2014-12-06 NOTE — Progress Notes (Signed)
Patient's temp. 101.9, gave 650 of Tylenol and temp now 101.5.  Notified MD.  Will continue to monitor patient.

## 2014-12-06 NOTE — Progress Notes (Signed)
UR completed 

## 2014-12-06 NOTE — Progress Notes (Signed)
Patient ID: Cody [name], male   DOB: 1933-03-16, 81 y.o.   MRN: [id number] [facility] PROGRESS NOTE  KYMANI SHIMABUKURO FEO:[id number] DOB: 1932-12-10 DOA: 12/05/2014 PCP: No primary care provider on file.  Brief narrative:    81 y.o. male with history of dementia, , diabetes, BPH, depression who presented from [place] [facility] to [facility] with cough, congestion, fevers. No respiratory distress. Patient is not a good historian because of dementia. Per nursing home staff, patient had an episode of passing out for a few seconds as he was being assisted from bed to the bathroom. On admission, blood pressure was 109/42, HR 83, RR 18 - 27, T max 103.3 F and oxygen saturation 92% on room air. Blood work on admission was essentially unremarkable. Chest x-ray showed no acute cardiopulmonary disease. Respiratory panel was ordered and patient was started empirically on Tamiflu, vancomycin and Zosyn.   Assessment/Plan:    Principal Problem:   Syncope - Unclear etiology, likely secondary to acute infection. Influenza PCR positive. - No further episodes of syncope reported by nursing staff since the admission. - Appreciate physical therapy evaluation  Active Problems: Sepsis secondary to influenza pneumonia - Sepsis criteria met on the admission with tachypnea, fever, lactic acidosis and source of infection influenza pneumonia  - Influenza PCR positive. Patient was started on Tamiflu on the admission. Patient also received vancomycin and Zosyn on the admission. Vancomycin and Zosyn were not continued beyond was given in ED. - Started Levaquin today because patient has persistent fevers. - Follow-up blood culture results. Urinalysis on the admission did not reveal nitrites or leukocytes.  Diabetes mellitus without complication - T2P is pending - Continue current insulin regimen which includes Levemir 25 units at bedtime along with sliding scale insulin - Appreciate diabetic coordinator  consult and recommendations  Dementia / depression - Continue Lexapro 10 mg daily  History of BPH - Continue Flomax daily   DVT Prophylaxis  - Heparin subcutaneous ordered   Code Status: Full.  Family Communication:  family not at the bedside Disposition Plan: Home when stable.   IV access:  Peripheral IV  Procedures and diagnostic studies:    Dg Chest 1 View 12/05/2014    No active disease.     Medical Consultants:  None   Other Consultants:  PT evaluation  IAnti-Infectives:   Levaquin 12/06/2014 --> Oseltamivir 12/06/2014 --> Vanco and zosyn given in ED 02/22/216   Leisa Lenz, MD  [facility] Pager [phone]  If 7PM-7AM, please contact night-coverage [email or web address] Password [facility] 12/06/2014, 10:42 AM      HPI/Subjective: No acute overnight events.  Objective: Filed Vitals:   12/06/14 0100 12/06/14 0442 12/06/14 0920 12/06/14 1019  BP: 109/42 143/63    Pulse: 65 68    Temp: 99.5 F (37.5 C) 100.8 F (38.2 C) 101.9 F (38.8 C) 101.5 F (38.6 C)  TempSrc: Oral Oral Oral Oral  Resp: 22 24    Height:      Weight: 72.3 kg (159 lb 6.3 oz)     SpO2: 100% 99%      Intake/Output Summary (Last 24 hours) at 12/06/14 1042 Last data filed at 12/05/14 2014  Gross per 24 hour  Intake    250 ml  Output      0 ml  Net    250 ml    Exam:   General:  Pt is alert, not in acute distress  Cardiovascular: Regular rate and rhythm, S1/S2, no murmurs  Respiratory: Clear to  auscultation bilaterally, no wheezing, no crackles, no rhonchi  Abdomen: Soft, non tender, non distended, bowel sounds present  Extremities: No edema, pulses DP and PT palpable bilaterally  Neuro: Grossly nonfocal  Data Reviewed: Basic Metabolic Panel:  Recent Labs Lab 12/05/14 1905 12/06/14 0626  NA 134* 136  K 4.1 3.9  CL 104 102  CO2 25 27  GLUCOSE 191* 236*  BUN 28* 23  CREATININE 1.21 1.08  CALCIUM 8.2* 8.1*   Liver Function Tests:  Recent Labs Lab  12/05/14 1905 12/06/14 0626  AST 21 18  ALT 20 18  ALKPHOS 75 62  BILITOT 0.3 0.5  PROT 7.1 6.2  ALBUMIN 3.7 3.2*   No results for input(s): LIPASE, AMYLASE in the last 168 hours. No results for input(s): AMMONIA in the last 168 hours. CBC:  Recent Labs Lab 12/05/14 1905 12/06/14 0626  WBC 8.6 7.2  NEUTROABS 6.4  --   HGB 13.0 11.8*  HCT 39.5 36.3*  MCV 86.8 86.8  PLT SPECIMEN CHECKED FOR CLOTS 167   Cardiac Enzymes: No results for input(s): CKTOTAL, CKMB, CKMBINDEX, TROPONINI in the last 168 hours. BNP: Invalid input(s): POCBNP CBG:  Recent Labs Lab 12/06/14 0754  GLUCAP 232*    Recent Results (from the past 240 hour(s))  Culture, blood (routine x 2)     Status: None (Preliminary result)   Collection Time: 12/05/14  7:35 PM  Result Value Ref Range Status   Specimen Description BLOOD LEFT ARM  Final   Culture NO GROWTH 1 DAY  Final   Report Status PENDING  Incomplete  Culture, blood (routine x 2)     Status: None (Preliminary result)   Collection Time: 12/05/14  7:40 PM  Result Value Ref Range Status   Specimen Description BLOOD LEFT HAND  Final   Culture NO GROWTH 1 DAY  Final   Report Status PENDING  Incomplete     Scheduled Meds: . sodium chloride   Intravenous STAT  . aspirin EC  81 mg Oral Daily  . escitalopram  10 mg Oral Daily  . fluticasone  2 spray Each Nare QHS  . haloperidol  1 mg Oral QHS  . heparin  5,000 Units Subcutaneous 3 times per day  . insulin aspart  0-9 Units Subcutaneous TID WC  . insulin detemir  25 Units Subcutaneous QHS  . oseltamivir  75 mg Oral BID  . simethicone      . tamsulosin  0.4 mg Oral Daily   Continuous Infusions: . sodium chloride 75 mL/hr at 12/06/14 0124  . sodium chloride 75 mL/hr at 12/05/14 1930

## 2014-12-06 NOTE — Progress Notes (Signed)
ANTIBIOTIC CONSULT NOTE - INITIAL  Pharmacy Consult for Levaquin Indication: fever, empiric Rx  No Known Allergies  Patient Measurements: Height: 5\' 2"  (157.5 cm) Weight: 159 lb 6.3 oz (72.3 kg) IBW/kg (Calculated) : 54.6  Vital Signs: Temp: 101.5 F (38.6 C) (02/23 1019) Temp Source: Oral (02/23 1019) BP: 143/63 mmHg (02/23 0442) Pulse Rate: 68 (02/23 0442) Intake/Output from previous day: 02/22 0701 - 02/23 0700 In: 250 [I.V.:250] Out: -  Intake/Output from this shift:    Labs:  Recent Labs  12/05/14 1905 12/06/14 0626  WBC 8.6 7.2  HGB 13.0 11.8*  PLT SPECIMEN CHECKED FOR CLOTS 167  CREATININE 1.21 1.08   Estimated Creatinine Clearance: 48.4 mL/min (by C-G formula based on Cr of 1.08). No results for input(s): VANCOTROUGH, VANCOPEAK, VANCORANDOM, GENTTROUGH, GENTPEAK, GENTRANDOM, TOBRATROUGH, TOBRAPEAK, TOBRARND, AMIKACINPEAK, AMIKACINTROU, AMIKACIN in the last 72 hours.   Microbiology: Recent Results (from the past 720 hour(s))  Culture, blood (routine x 2)     Status: None (Preliminary result)   Collection Time: 12/05/14  7:35 PM  Result Value Ref Range Status   Specimen Description BLOOD LEFT ARM  Final   Special Requests BOTTLES DRAWN AEROBIC AND ANAEROBIC 8CC EACH  Final   Culture NO GROWTH 1 DAY  Final   Report Status PENDING  Incomplete  Culture, blood (routine x 2)     Status: None (Preliminary result)   Collection Time: 12/05/14  7:40 PM  Result Value Ref Range Status   Specimen Description BLOOD LEFT HAND  Final   Special Requests BOTTLES DRAWN AEROBIC AND ANAEROBIC 8CC EACH  Final   Culture NO GROWTH 1 DAY  Final   Report Status PENDING  Incomplete    Medical History: Past Medical History  Diagnosis Date  . Essential hypertension, benign   . Type 2 diabetes mellitus   . Dementia   . Parkinson's disease    Anti-infectives    Start     Dose/Rate Route Frequency Ordered Stop   12/06/14 1100  levofloxacin (LEVAQUIN) IVPB 750 mg     750  mg 100 mL/hr over 90 Minutes Intravenous Every 24 hours 12/06/14 1045     12/05/14 2230  oseltamivir (TAMIFLU) capsule 75 mg     75 mg Oral 2 times daily 12/05/14 2226 12/10/14 2159   12/05/14 2115  piperacillin-tazobactam (ZOSYN) IVPB 3.375 g     3.375 g 100 mL/hr over 30 Minutes Intravenous  Once 12/05/14 2104 12/05/14 2146   12/05/14 2115  vancomycin (VANCOCIN) IVPB 1000 mg/200 mL premix     1,000 mg 200 mL/hr over 60 Minutes Intravenous  Once 12/05/14 2104 12/05/14 2233     Assessment: 81 y.o. male with history of dementia, , diabetes, BPH, depression who [facility] with cough, congestion, fevers. No respiratory distress. Patient is not a good historian because of dementia.  On admission, blood pressure was 109/42, HR 83, RR 18 - 27, T max 103.3 F and oxygen saturation 92% on room air. Blood work on admission was essentially unremarkable. Chest x-ray showed no acute cardiopulmonary disease. Respiratory panel was ordered and patient was started empirically on Tamiflu and Levaquin. (Vancomycin and Zosyn stopped)  Normalized ClCr > 7250ml/min  Goal of Therapy:  Eradicate infection.  Plan:  Levaquin 750mg  IV q24hrs Switch to PO when appropriate.  Valrie HartHall, Sulayman Manning A 12/06/2014,11:33 AM

## 2014-12-07 ENCOUNTER — Inpatient Hospital Stay (HOSPITAL_COMMUNITY): Payer: Medicare Other

## 2014-12-07 DIAGNOSIS — I10 Essential (primary) hypertension: Secondary | ICD-10-CM

## 2014-12-07 LAB — BASIC METABOLIC PANEL
ANION GAP: 7 (ref 5–15)
BUN: 24 mg/dL — AB (ref 6–23)
CO2: 25 mmol/L (ref 19–32)
CREATININE: 1.01 mg/dL (ref 0.50–1.35)
Calcium: 8 mg/dL — ABNORMAL LOW (ref 8.4–10.5)
Chloride: 104 mmol/L (ref 96–112)
GFR calc non Af Amer: 69 mL/min — ABNORMAL LOW (ref >90)
GFR, EST AFRICAN AMERICAN: 79 mL/min — AB (ref >90)
Glucose, Bld: 109 mg/dL — ABNORMAL HIGH (ref 70–99)
POTASSIUM: 3.7 mmol/L (ref 3.5–5.1)
Sodium: 136 mmol/L (ref 135–145)

## 2014-12-07 LAB — GLUCOSE, CAPILLARY
GLUCOSE-CAPILLARY: 109 mg/dL — AB (ref 70–99)
GLUCOSE-CAPILLARY: 208 mg/dL — AB (ref 70–99)
GLUCOSE-CAPILLARY: 141 mg/dL — AB (ref 70–99)
GLUCOSE-CAPILLARY: 175 mg/dL — AB (ref 70–99)

## 2014-12-07 LAB — CBC
HEMATOCRIT: 35.8 % — AB (ref 39.0–52.0)
HEMOGLOBIN: 11.8 g/dL — AB (ref 13.0–17.0)
MCH: 28.4 pg (ref 26.0–34.0)
MCHC: 33 g/dL (ref 30.0–36.0)
MCV: 86.3 fL (ref 78.0–100.0)
Platelets: 146 10*3/uL — ABNORMAL LOW (ref 150–400)
RBC: 4.15 MIL/uL — ABNORMAL LOW (ref 4.22–5.81)
RDW: 12.9 % (ref 11.5–15.5)
WBC: 6.1 10*3/uL (ref 4.0–10.5)

## 2014-12-07 LAB — URINE CULTURE: Colony Count: 30000

## 2014-12-07 LAB — HEMOGLOBIN A1C
Hgb A1c MFr Bld: 7.6 % — ABNORMAL HIGH (ref 4.8–5.6)
Mean Plasma Glucose: 171 mg/dL

## 2014-12-07 NOTE — Progress Notes (Signed)
Progress Note   Cody [name] YFV:[id number] DOB: Feb 24, 1933 DOA: 12/05/2014 PCP: No primary care provider on file.   Brief Narrative:   Cody [name] is an 81 y.o. male with a PMH of dementia, diabetes, BPH, and depression who presented from [place] [facility] to [facility] on 12/05/14 with cough, congestion, fevers. No respiratory distress. Patient is not a good historian because of dementia. Per nursing home staff, patient had an episode of passing out for a few seconds as he was being assisted from bed to the bathroom. On admission, blood pressure was 109/42, HR 83, RR 18 - 27, T max 103.3 F and oxygen saturation 92% on room air. Blood work on admission was essentially unremarkable. Chest x-ray showed no acute cardiopulmonary disease. Respiratory panel was ordered and patient was started empirically on Tamiflu, vancomycin and Zosyn.  Assessment/Plan:   Principal Problem:  Syncope - Unclear etiology, likely secondary to acute infection. Influenza PCR positive. - No further episodes of syncope reported by nursing staff since the admission. - Appreciate physical therapy evaluation.  Active Problems: MRSA carrier -Decontamination therapy ordered.  Sepsis secondary to influenza pneumonia - Sepsis criteria met on the admission with tachypnea, fever, lactic acidosis and source of infection influenza pneumonia - Influenza PCR positive. Patient was started on Tamiflu on the admission. Patient also received vancomycin and Zosyn on the admission. Vancomycin and Zosyn were not continued beyond was given in ED. - Levaquin started 12/06/14 secondary to persistent fevers. - Follow-up blood culture results. Urinalysis on the admission did not reveal nitrites or leukocytes.  Diabetes mellitus without complication - F6B is 8.4%.  CBGs 109-232/24 hours. - Continue current insulin regimen which includes Levemir 25 units at bedtime along with insulin sensitive sliding scale. - Appreciate  diabetic coordinator consult and recommendations.  Dementia / depression - Continue Lexapro 10 mg daily.  History of BPH - Continue Flomax daily.  DVT Prophylaxis  - Heparin subcutaneous ordered.  Code Status: Full.  Family Communication: The patient tells me he has "no family". Disposition Plan: From SNF. Likely D/C back to SNF 12/08/14.  IV Access:    Peripheral IV   Procedures and diagnostic studies:   Dg Chest 1 View 12/05/2014 No active disease.   Medical Consultants:    None.  Anti-Infectives:    Levaquin 12/06/2014 -->  Oseltamivir 12/06/2014 -->  Vanco and zosyn given in ED 02/22/216  Subjective:   Cody [name] tells me he doesn't feel well, but cannot characterize it further.  Denies pain.  Denies dyspnea.  Denies nausea/vomiting.  Energy level low.    Objective:    Filed Vitals:   12/06/14 1019 12/06/14 1434 12/06/14 2133 12/07/14 0641  BP:  153/75 150/53 119/68  Pulse:  60 60 56  Temp: 101.5 F (38.6 C) 99.4 F (37.4 C) 99.4 F (37.4 C) 99.2 F (37.3 C)  TempSrc: Oral  Oral Oral  Resp:   20 20  Height:      Weight:      SpO2:  98% 97% 97%    Intake/Output Summary (Last 24 hours) at 12/07/14 6659 Last data filed at 12/06/14 1829  Gross per 24 hour  Intake 1341.25 ml  Output      0 ml  Net 1341.25 ml    Exam: Gen:  NAD Cardiovascular:  RRR, No M/R/G Respiratory:  Lungs CTAB Gastrointestinal:  Abdomen soft, NT/ND, + BS Extremities:  No C/E/C   Data Reviewed:    Labs: Basic  Metabolic Panel:  Recent Labs Lab 12/05/14 1905 12/06/14 0626 12/07/14 0720  NA 134* 136 136  K 4.1 3.9 3.7  CL 104 102 104  CO2 _0 GLUCOSE 191* 236* 109*  BUN 28* 23 24*  CREATININE 1.21 1.08 1.01  CALCIUM 8.2* 8.1* 8.0*   GFR Estimated Creatinine Clearance: 51.8 mL/min (by C-G formula based on Cr of 1.01). Liver Function Tests:  Recent Labs Lab 12/05/14 1905 12/06/14 0626  AST 21 18  ALT 20 18  ALKPHOS 75 62    BILITOT 0.3 0.5  PROT 7.1 6.2  ALBUMIN 3.7 3.2*   CBC:  Recent Labs Lab 12/05/14 1905 12/06/14 0626 12/07/14 0720  WBC 8.6 7.2 6.1  NEUTROABS 6.4  --   --   HGB 13.0 11.8* 11.8*  HCT 39.5 36.3* 35.8*  MCV 86.8 86.8 86.3  PLT SPECIMEN CHECKED FOR CLOTS 167 146*   CBG:  Recent Labs Lab 12/06/14 0754 12/06/14 1135 12/06/14 1631 12/06/14 2137 12/07/14 0730  GLUCAP 232* 200* 124* 152* 109*   Hgb A1c:  Recent Labs  12/05/14 2224  HGBA1C 7.6*   Sepsis Labs:  Recent Labs Lab 12/05/14 1905 12/05/14 1927 12/06/14 0626 12/07/14 0720  WBC 8.6  --  7.2 6.1  LATICACIDVEN  --  2.66* 1.5  --    Microbiology Recent Results (from the past 240 hour(s))  Culture, blood (routine x 2)     Status: None (Preliminary result)   Collection Time: 12/05/14  7:35 PM  Result Value Ref Range Status   Specimen Description BLOOD LEFT ARM  Final   Special Requests BOTTLES DRAWN AEROBIC AND ANAEROBIC 8CC EACH  Final   Culture NO GROWTH 1 DAY  Final   Report Status PENDING  Incomplete  Culture, blood (routine x 2)     Status: None (Preliminary result)   Collection Time: 12/05/14  7:40 PM  Result Value Ref Range Status   Specimen Description BLOOD LEFT HAND  Final   Special Requests BOTTLES DRAWN AEROBIC AND ANAEROBIC 8CC EACH  Final   Culture NO GROWTH 1 DAY  Final   Report Status PENDING  Incomplete  MRSA PCR Screening     Status: Abnormal   Collection Time: 12/06/14 10:08 AM  Result Value Ref Range Status   MRSA by PCR POSITIVE (A) NEGATIVE Final    Comment:        The GeneXpert MRSA Assay (FDA approved for NASAL specimens only), is one component of a comprehensive MRSA colonization surveillance program. It is not intended to diagnose MRSA infection nor to guide or monitor treatment for MRSA infections. RESULT CALLED TO, READ BACK BY AND VERIFIED WITH: FORTE,L AT [place] BY HUFFINES,S ON 12/06/14      Medications:   . aspirin EC  81 mg Oral Daily  . Chlorhexidine  Gluconate Cloth  6 each Topical Q0600  . escitalopram  10 mg Oral Daily  . fluticasone  2 spray Each Nare QHS  . haloperidol  1 mg Oral QHS  . heparin  5,000 Units Subcutaneous 3 times per day  . insulin aspart  0-9 Units Subcutaneous TID WC  . insulin detemir  25 Units Subcutaneous QHS  . levofloxacin (LEVAQUIN) IV  750 mg Intravenous Q24H  . mupirocin ointment  1 application Nasal BID  . oseltamivir  75 mg Oral BID  . sodium chloride  3 mL Intravenous Q12H  . tamsulosin  0.4 mg Oral Daily   Continuous Infusions: . sodium chloride 75 mL/hr at  12/06/14 1555    Time spent: 25 minutes.   LOS: 1 day   RAMA,CHRISTINA  [facility] Pager [phone]. If unable to reach me by pager, please call my cell phone at [phone].  *Please refer to [email or web address], password [email or web address] to get updated schedule on who will round on this patient, as hospitalists switch teams weekly. If 7PM-7AM, please contact night-coverage at [email or web address], password [email or web address] for any overnight needs.  12/07/2014, 8:29 AM

## 2014-12-07 NOTE — Evaluation (Signed)
Physical Therapy Evaluation Patient Details Name: Cody [name] MRN: [id number] DOB: 02-Oct-1933 Today's Date: 12/07/2014   History of Present Illness  Pt presenting form [facility]. Call to discuss patient's case with nursing home facility and spoke to the nurse who cares for the patient today and called for medical transport. Staff member reports patient has had one-day history of cough, congestion, runny nose. Denies any complaints of chest pain, palpitations, shortness of breath, fevers, diarrhea, abdominal pain, rash. Reports that today while assisting patient up from toilet patient started coughing episode which resulted in patient passing out for a few seconds. No reported seizure type activity or loss of bowel or bladder function during this time. No actual fall or head trauma. Patient was assisted back down to toilet until able to wake up. No other change in typical physical or mental condition recently.  Clinical Impression  Pt is cooperative.   He states he feels better and has been using a walker to ambulate with for the last five years. Pt refuses ambulation today stating he just doesn't feel like it but is I in bed mobility and is able to complete LE exercises without assist.  Therapist assumes that when pt is willing to ambulate he will be very close to prior functional level.     Follow Up Recommendations Home health PT    Equipment Recommendations  Rolling walker with 5" wheels       Precautions / Restrictions Precautions Precautions: None Restrictions Weight Bearing Restrictions: No      Mobility  Bed Mobility Overal bed mobility: Independent                Transfers Overall transfer level: Needs assistance   Transfers: Sit to/from Stand Sit to Stand: Max assist         General transfer comment: attempted to stand but pt stated he did not want to and was resisting pt.  Ambulation/Gait Ambulation/Gait assistance:  (not tested)                        Pertinent Vitals/Pain Pain Assessment: No/denies pain    Home Living Family/patient expects to be discharged to:: Assisted living                 Additional Comments: unsure what pt has but pt states he uses a walker to ambulate with.    Prior Function Level of Independence: Needs assistance   Gait / Transfers Assistance Needed: needs walker  ADL's / Homemaking Assistance Needed: needs assist            Extremity/Trunk Assessment               Lower Extremity Assessment: Generalized weakness         Communication   Communication: No difficulties  Cognition Arousal/Alertness: Awake/alert Behavior During Therapy: WFL for tasks assessed/performed Overall Cognitive Status: Within Functional Limits for tasks assessed                         Exercises General Exercises - Lower Extremity Ankle Circles/Pumps: Both;10 reps Heel Slides: Both;10 reps Hip ABduction/ADduction: Both;Sidelying;10 reps      Assessment/Plan    PT Assessment Patient needs continued PT services  PT Diagnosis Difficulty walking;Generalized weakness   PT Problem List Decreased strength;Decreased activity tolerance;Decreased mobility  PT Treatment Interventions Gait training;Therapeutic exercise   PT Goals (Current goals can be found in the Care Plan section)  Frequency Min 3X/week           End of Session Equipment Utilized During Treatment: Gait belt Activity Tolerance: Other (comment) (pt self limited treatment) Patient left: in bed;with call bell/phone within reach;with bed alarm set           Time: 1256-1319 PT Time Calculation (min) (ACUTE ONLY): 23 min   Charges:   PT Evaluation $Initial PT Evaluation Tier I: 1 Procedure     PT G Codes:        Archie,CINDY 12/07/2014, 1:20 PM

## 2014-12-08 LAB — GLUCOSE, CAPILLARY
Glucose-Capillary: 94 mg/dL (ref 70–99)
Glucose-Capillary: 200 mg/dL — ABNORMAL HIGH (ref 70–99)

## 2014-12-08 MED ORDER — INSULIN DETEMIR 100 UNIT/ML ~~LOC~~ SOLN: 30.0000 [IU] | Freq: Every day | SUBCUTANEOUS | Status: DC

## 2014-12-08 MED ORDER — LEVOFLOXACIN 750 MG PO TABS: 750.0000 mg | ORAL_TABLET | Freq: Every day | ORAL | Status: DC

## 2014-12-08 MED ORDER — OSELTAMIVIR PHOSPHATE 75 MG PO CAPS: 75.0000 mg | ORAL_CAPSULE | Freq: Two times a day (BID) | ORAL | Status: DC

## 2014-12-08 NOTE — Discharge Summary (Signed)
Physician Discharge Summary  Cody [name] VCB:[id number] DOB: [birth year] DOA: 12/05/2014  PCP: No primary care provider on file.  Admit date: 12/05/2014 Discharge date: 12/08/2014  Time spent: 40 minutes  Recommendations for Outpatient Follow-up:  1. Follow up with primary care physician in 1-2 weeks  Discharge Diagnoses:  Principal Problem:   Sepsis Active Problems:   Syncope   BPH (benign prostatic hyperplasia)   Dementia   Diabetes mellitus without complication   Influenza, pneumonia   Fever   Discharge Condition: improved  Diet recommendation: low salt, low carb  [facility] Weights   12/05/14 2115 12/06/14 0100  Weight: 72.576 kg (160 lb) 72.3 kg (159 lb 6.3 oz)    History of present illness:  Cody [name] is an 81 y.o. male with a PMH of dementia, diabetes, BPH, and depression who presented from [place] home to [facility] ED on 12/05/14 with cough, congestion, fevers. No respiratory distress. Patient is not a good historian because of dementia. Per nursing home staff, patient had an episode of passing out for a few seconds as he was being assisted from bed to the bathroom. On admission, blood pressure was 109/42, HR 83, RR 18 - 27, T max 103.3 F and oxygen saturation 92% on room air. Blood work on admission was essentially unremarkable. Chest x-ray showed no acute cardiopulmonary disease. Respiratory panel was ordered and patient was started empirically on Tamiflu, vancomycin and Zosyn  Hospital Course:  Sepsis secondary to influenza pneumonia - Sepsis criteria met on the admission with tachypnea, fever, lactic acidosis and source of infection influenza pneumonia - Influenza PCR positive. Patient was started on Tamiflu on the admission. Patient also received vancomycin and Zosyn on the admission. Vancomycin and Zosyn were not continued beyond was given in ED. - Levaquin started 12/06/14 secondary to persistent fevers. - Blood cultures have shown no growth. Urinalysis  on the admission did not reveal nitrites or leukocytes.  Diabetes mellitus without complication - B8G is 6.6%. - Continue current insulin regimen which includes Levemir 30 units at bedtime - Appreciate diabetic coordinator consult and recommendations.  Dementia / depression - Continue Lexapro 10 mg daily.  History of BPH - Continue Flomax daily.  Procedures:    Consultations:    Discharge Exam: Filed Vitals:   12/08/14 0649  BP: 166/50  Pulse: 50  Temp: 99.6 F (37.6 C)  Resp: 20    General: NAD Cardiovascular: s1 s2 rrr Respiratory: cta b  Discharge Instructions   Discharge Instructions    Diet - low sodium heart healthy    Complete by:  As directed      Diet Carb Modified    Complete by:  As directed      Increase activity slowly    Complete by:  As directed           Current Discharge Medication List    START taking these medications   Details  levofloxacin (LEVAQUIN) 750 MG tablet Take 1 tablet (750 mg total) by mouth daily. Qty: 2 tablet, Refills: 0    oseltamivir (TAMIFLU) 75 MG capsule Take 1 capsule (75 mg total) by mouth 2 (two) times daily. Qty: 4 capsule, Refills: 0      CONTINUE these medications which have CHANGED   Details  insulin detemir (LEVEMIR) 100 UNIT/ML injection Inject 0.3 mLs (30 Units total) into the skin at bedtime. Qty: 10 mL, Refills: 11      CONTINUE these medications which have NOT CHANGED   Details  aspirin  EC 81 MG tablet Take 81 mg by mouth daily.      Camphor-Eucalyptus-Menthol (VICKS VAPORUB EX) Apply 1 application topically 2 (two) times daily. *Applied to each affected fungal nail    clotrimazole-betamethasone (LOTRISONE) cream Apply 1 application topically 2 (two) times daily as needed. Itching      escitalopram (LEXAPRO) 10 MG tablet Take 10 mg by mouth daily.    fluticasone (FLONASE) 50 MCG/ACT nasal spray Place into both nostrils daily.    haloperidol (HALDOL) 1 MG tablet Take 1 mg by mouth at  bedtime.      hydrOXYzine (ATARAX) 25 MG tablet Take 25 mg by mouth every 6 (six) hours as needed. itching     linagliptin (TRADJENTA) 5 MG TABS tablet Take 5 mg by mouth daily.    loratadine (CLARITIN) 10 MG tablet Take 10 mg by mouth daily.    Melatonin 5 MG CAPS Take 10 mg by mouth at bedtime.    Tamsulosin HCl (FLOMAX) 0.4 MG CAPS Take 0.4 mg by mouth daily.      Thiamine HCl (VITAMIN B-1) 100 MG tablet Take 100 mg by mouth daily.        STOP taking these medications     insulin aspart (NOVOLOG) 100 UNIT/ML injection        No Known Allergies Follow-up Information    Follow up with follow up with primary care doctor in 1-2 weeks. Schedule an appointment as soon as possible for a visit in 2 weeks.       The results of significant diagnostics from this hospitalization (including imaging, microbiology, ancillary and laboratory) are listed below for reference.    Significant Diagnostic Studies: Dg Chest 1 View  12/05/2014   CLINICAL DATA:  Cough and sinus congestion.  EXAM: CHEST  1 VIEW  COMPARISON:  05/26/2013  FINDINGS: Shallow inspiration with elevation of right hemidiaphragm. The heart size and mediastinal contours are within normal limits. Both lungs are clear. The visualized skeletal structures are unremarkable.  IMPRESSION: No active disease.   Electronically Signed   By: Lucienne Capers M.D.   On: 12/05/2014 18:57   Dg Chest Port 1 View  12/07/2014   CLINICAL DATA:  Fever  EXAM: PORTABLE CHEST - 1 VIEW  COMPARISON:  12/05/2014  FINDINGS: Elevation of the right hemidiaphragm. Right basilar atelectasis. Left lung is clear. No effusions. No acute bony abnormality. Heart is borderline in size.  IMPRESSION: Elevated right hemidiaphragm with right base atelectasis.   Electronically Signed   By: Rolm Baptise M.D.   On: 12/07/2014 11:14    Microbiology: Recent Results (from the past 240 hour(s))  Culture, blood (routine x 2)     Status: None (Preliminary result)    Collection Time: 12/05/14  7:35 PM  Result Value Ref Range Status   Specimen Description BLOOD LEFT ARM  Final   Special Requests BOTTLES DRAWN AEROBIC AND ANAEROBIC [place]  Final   Culture NO GROWTH 3 DAYS  Final   Report Status PENDING  Incomplete  Culture, blood (routine x 2)     Status: None (Preliminary result)   Collection Time: 12/05/14  7:40 PM  Result Value Ref Range Status   Specimen Description BLOOD LEFT HAND  Final   Special Requests BOTTLES DRAWN AEROBIC AND ANAEROBIC [place]  Final   Culture NO GROWTH 3 DAYS  Final   Report Status PENDING  Incomplete  Urine culture     Status: None   Collection Time: 12/05/14  8:30 PM  Result Value Ref Range Status   Specimen Description URINE, CLEAN CATCH  Final   Special Requests NONE  Final   Colony Count   Final    30,000 COLONIES/ML Performed at [facility]    Culture   Final    Multiple bacterial morphotypes present, none predominant. Suggest appropriate recollection if clinically indicated. Performed at [facility]    Report Status 12/07/2014 FINAL  Final  MRSA PCR Screening     Status: Abnormal   Collection Time: 12/06/14 10:08 AM  Result Value Ref Range Status   MRSA by PCR POSITIVE (A) NEGATIVE Final    Comment:        The GeneXpert MRSA Assay (FDA approved for NASAL specimens only), is one component of a comprehensive MRSA colonization surveillance program. It is not intended to diagnose MRSA infection nor to guide or monitor treatment for MRSA infections. RESULT CALLED TO, READ BACK BY AND VERIFIED WITH: FORTE,L AT [place] BY HUFFINES,S ON 12/06/14      Labs: Basic Metabolic Panel:  Recent Labs Lab 12/05/14 1905 12/06/14 0626 12/07/14 0720  NA 134* 136 136  K 4.1 3.9 3.7  CL 104 102 104  CO2 25 27 25   GLUCOSE 191* 236* 109*  BUN 28* 23 24*  CREATININE 1.21 1.08 1.01  CALCIUM 8.2* 8.1* 8.0*   Liver Function Tests:  Recent Labs Lab 12/05/14 1905 12/06/14 0626  AST 21 18   ALT 20 18  ALKPHOS 75 62  BILITOT 0.3 0.5  PROT 7.1 6.2  ALBUMIN 3.7 3.2*   No results for input(s): LIPASE, AMYLASE in the last 168 hours. No results for input(s): AMMONIA in the last 168 hours. CBC:  Recent Labs Lab 12/05/14 1905 12/06/14 0626 12/07/14 0720  WBC 8.6 7.2 6.1  NEUTROABS 6.4  --   --   HGB 13.0 11.8* 11.8*  HCT 39.5 36.3* 35.8*  MCV 86.8 86.8 86.3  PLT SPECIMEN CHECKED FOR CLOTS 167 146*   Cardiac Enzymes: No results for input(s): CKTOTAL, CKMB, CKMBINDEX, TROPONINI in the last 168 hours. BNP: BNP (last 3 results) No results for input(s): BNP in the last 8760 hours.  ProBNP (last 3 results) No results for input(s): PROBNP in the last 8760 hours.  CBG:  Recent Labs Lab 12/07/14 1204 12/07/14 1657 12/07/14 2200 12/08/14 0754 12/08/14 1146  GLUCAP 208* 141* 175* 94 200*       Signed:  Teylor Wolven  [facility] 12/08/2014, 1:36 PM

## 2014-12-08 NOTE — Progress Notes (Signed)
Physical Therapy Treatment Patient Details Name: Cody [name] MRN: [id number] DOB: May 23, 1933 Today's Date: 12/08/2014    History of Present Illness Pt presenting form [facility]. Call to discuss patient's case with nursing home facility and spoke to the nurse who cares for the patient today and called for medical transport. Staff member reports patient has had one-day history of cough, congestion, runny nose. Denies any complaints of chest pain, palpitations, shortness of breath, fevers, diarrhea, abdominal pain, rash. Reports that today while assisting patient up from toilet patient started coughing episode which resulted in patient passing out for a few seconds. No reported seizure type activity or loss of bowel or bladder function during this time. No actual fall or head trauma. Patient was assisted back down to toilet until able to wake up. No other change in typical physical or mental condition recently.    PT Comments    Pt was seen for gait training.  He reports feeling well and was cooperative with PT.  He needed hand held assist to transfer from sit to stand to a walker.  He was able to ambulate 77' with a rolling walker but gait was exceedingly fast.  He was incapable of following verbal instructions to slow down manual resistance was needed to slow him down.  He tends to be very impulsive and presents a high fall risk.  Evaluating therapist recommended HHPT at d/c but I am not sure pt will be able to retain any teaching.  He will definitely need full time supervision to maintain safety or encourage mobility from a w/c.  Follow Up Recommendations   HHPT     Equipment Recommendations    unknown..the patient should already have a walker at home   Recommendations for Other Services  none     Precautions / Restrictions Precautions Precautions: Fall Restrictions Weight Bearing Restrictions: No    Mobility  Bed Mobility Overal bed mobility: Modified Independent                Transfers Overall transfer level: Needs assistance Equipment used: Rolling walker (2 wheeled) Transfers: Sit to/from Stand Sit to Stand: Supervision         General transfer comment: pt is very impulsive and therefore needs supervision for safety  Ambulation/Gait Ambulation/Gait assistance: Min assist Ambulation Distance (Feet): 80 Feet Assistive device: Rolling walker (2 wheeled) Gait Pattern/deviations: Trunk flexed   Gait velocity interpretation: at or above normal speed for age/gender General Gait Details: pt ambulates very rapidly and gets the walker too far away from him...it took manual resistance to slow him down as he was incapable of following verbal cues   Stairs            Wheelchair Mobility    Modified Rankin (Stroke Patients Only)       Balance Overall balance assessment: Needs assistance Sitting-balance support: No upper extremity supported;Feet supported Sitting balance-Leahy Scale: Good     Standing balance support: No upper extremity supported Standing balance-Leahy Scale: Fair                      Cognition Arousal/Alertness: Awake/alert Behavior During Therapy: WFL for tasks assessed/performed Overall Cognitive Status: History of cognitive impairments - at baseline                      Exercises      General Comments        Pertinent Vitals/Pain Pain Assessment: No/denies pain  Home Living                      Prior Function            PT Goals (current goals can now be found in the care plan section) Progress towards PT goals: Goals met and updated - see care plan    Frequency       PT Plan Current plan remains appropriate    Co-evaluation             End of Session Equipment Utilized During Treatment: Gait belt Activity Tolerance: Patient tolerated treatment well Patient left: in chair;with call bell/phone within reach;with chair alarm set     Time:  1102-1117 PT Time Calculation (min) (ACUTE ONLY): 20 min  Charges:  $Gait Training: 8-22 mins                    G Codes:      Sable Feil December 21, 2014, 9:45 AM

## 2014-12-08 NOTE — Progress Notes (Signed)
Cody [name] discharged [facility] per MD order.  Report called to receiving Selena BattenKim, Med tech at [facility].     Medication List    STOP taking these medications        insulin aspart 100 UNIT/ML injection  Commonly known as:  novoLOG      TAKE these medications        aspirin EC 81 MG tablet  Take 81 mg by mouth daily.     clotrimazole-betamethasone cream  Commonly known as:  LOTRISONE  - Apply 1 application topically 2 (two) times daily as needed. Itching  -      escitalopram 10 MG tablet  Commonly known as:  LEXAPRO  Take 10 mg by mouth daily.     fluticasone 50 MCG/ACT nasal spray  Commonly known as:  FLONASE  Place into both nostrils daily.     haloperidol 1 MG tablet  Commonly known as:  HALDOL  Take 1 mg by mouth at bedtime.     hydrOXYzine 25 MG tablet  Commonly known as:  ATARAX/VISTARIL  Take 25 mg by mouth every 6 (six) hours as needed. itching     insulin detemir 100 UNIT/ML injection  Commonly known as:  LEVEMIR  Inject 0.3 mLs (30 Units total) into the skin at bedtime.     levofloxacin 750 MG tablet  Commonly known as:  LEVAQUIN  Take 1 tablet (750 mg total) by mouth daily.     linagliptin 5 MG Tabs tablet  Commonly known as:  TRADJENTA  Take 5 mg by mouth daily.     loratadine 10 MG tablet  Commonly known as:  CLARITIN  Take 10 mg by mouth daily.     Melatonin 5 MG Caps  Take 10 mg by mouth at bedtime.     oseltamivir 75 MG capsule  Commonly known as:  TAMIFLU  Take 1 capsule (75 mg total) by mouth 2 (two) times daily.     tamsulosin 0.4 MG Caps capsule  Commonly known as:  FLOMAX  Take 0.4 mg by mouth daily.     thiamine 100 MG tablet  Commonly known as:  VITAMIN B-1  Take 100 mg by mouth daily.     VICKS VAPORUB EX  Apply 1 application topically 2 (two) times daily. *Applied to each affected fungal nail        Patients skin is clean, dry and intact, no evidence of skin break down. IV site discontinued and  catheter remains intact. Site without signs and symptoms of complications. Dressing and pressure applied.  Patient transported by facility back to facility,  no distress noted upon discharge.  Cody [name], Cody [name] Cody [name] 12/08/2014 2:46 PM

## 2014-12-08 NOTE — Clinical Social Work Note (Signed)
Pt d/c today back to [facility]. Facility notified. No family to contact. D/C summary and FL2 faxed. Facility to provide transportation.  Derenda FennelKara Myran Ar[place], [place] [phone]

## 2014-12-10 LAB — CULTURE, BLOOD (ROUTINE X 2)
CULTURE: NO GROWTH
Culture: NO GROWTH

## 2015-05-09 ENCOUNTER — Encounter: Payer: Self-pay | Admitting: Internal Medicine

## 2015-07-03 ENCOUNTER — Emergency Department (HOSPITAL_COMMUNITY): Payer: Medicare Other

## 2015-07-03 ENCOUNTER — Encounter (HOSPITAL_COMMUNITY): Payer: Self-pay | Admitting: Emergency Medicine

## 2015-07-03 ENCOUNTER — Emergency Department (HOSPITAL_COMMUNITY)
Admission: EM | Admit: 2015-07-03 | Discharge: 2015-07-03 | Disposition: A | Discharge status: Home / Self Care | Payer: Medicare Other | Attending: Emergency Medicine | Admitting: Emergency Medicine

## 2015-07-03 DIAGNOSIS — S0101XA Laceration without foreign body of scalp, initial encounter: Secondary | ICD-10-CM | POA: Diagnosis present

## 2015-07-03 DIAGNOSIS — Z7982 Long term (current) use of aspirin: Secondary | ICD-10-CM | POA: Diagnosis not present

## 2015-07-03 DIAGNOSIS — G20 Parkinson's disease: Secondary | ICD-10-CM | POA: Diagnosis not present

## 2015-07-03 DIAGNOSIS — I10 Essential (primary) hypertension: Secondary | ICD-10-CM | POA: Diagnosis not present

## 2015-07-03 DIAGNOSIS — H5442 Blindness, left eye, normal vision right eye: Secondary | ICD-10-CM | POA: Insufficient documentation

## 2015-07-03 DIAGNOSIS — Z87891 Personal history of nicotine dependence: Secondary | ICD-10-CM | POA: Diagnosis not present

## 2015-07-03 DIAGNOSIS — Z7951 Long term (current) use of inhaled steroids: Secondary | ICD-10-CM | POA: Diagnosis not present

## 2015-07-03 DIAGNOSIS — N400 Enlarged prostate without lower urinary tract symptoms: Secondary | ICD-10-CM | POA: Insufficient documentation

## 2015-07-03 DIAGNOSIS — W19XXXA Unspecified fall, initial encounter: Secondary | ICD-10-CM

## 2015-07-03 DIAGNOSIS — Z79899 Other long term (current) drug therapy: Secondary | ICD-10-CM | POA: Diagnosis not present

## 2015-07-03 DIAGNOSIS — Y9389 Activity, other specified: Secondary | ICD-10-CM | POA: Insufficient documentation

## 2015-07-03 DIAGNOSIS — E119 Type 2 diabetes mellitus without complications: Secondary | ICD-10-CM | POA: Diagnosis not present

## 2015-07-03 DIAGNOSIS — F0390 Unspecified dementia without behavioral disturbance: Secondary | ICD-10-CM | POA: Insufficient documentation

## 2015-07-03 DIAGNOSIS — G4700 Insomnia, unspecified: Secondary | ICD-10-CM | POA: Insufficient documentation

## 2015-07-03 DIAGNOSIS — W01198A Fall on same level from slipping, tripping and stumbling with subsequent striking against other object, initial encounter: Secondary | ICD-10-CM | POA: Diagnosis not present

## 2015-07-03 DIAGNOSIS — Y998 Other external cause status: Secondary | ICD-10-CM | POA: Insufficient documentation

## 2015-07-03 DIAGNOSIS — Z794 Long term (current) use of insulin: Secondary | ICD-10-CM | POA: Diagnosis not present

## 2015-07-03 DIAGNOSIS — Y92198 Other place in other specified residential institution as the place of occurrence of the external cause: Secondary | ICD-10-CM | POA: Diagnosis not present

## 2015-07-03 NOTE — ED Provider Notes (Addendum)
CSN: [id number]     Arrival date & time 07/03/15  1343 History   First MD Initiated Contact with Patient 07/03/15 1408     Chief Complaint  Patient presents with  . Fall     (Consider location/radiation/quality/duration/timing/severity/associated sxs/prior Treatment) Patient is a 81 y.o. male presenting with fall. The history is provided by the patient.  Fall Pertinent negatives include no abdominal pain, no headaches and no shortness of breath.   patient from [facility] facility. Status post fall striking the back of his head with a laceration. Patient has a history of dementia and Parkinson's disease supposedly his baseline. Patient will answer questions. Patient without any specific complaint. Denies any other injury or pain.  Past Medical History  Diagnosis Date  . Essential hypertension, benign   . Type 2 diabetes mellitus   . Dementia   . Parkinson's disease   . Insomnia   . Benign prostate hyperplasia    History reviewed. No pertinent past surgical history. History reviewed. No pertinent family history. Social History  Substance Use Topics  . Smoking status: Former Smoker -- 0.50 packs/day for 4 years    Types: Cigarettes  . Smokeless tobacco: Never Used  . Alcohol Use: No    Review of Systems  Constitutional: Negative for fever.  HENT: Negative for congestion.   Eyes: Negative for visual disturbance.  Respiratory: Negative for shortness of breath.   Gastrointestinal: Negative for abdominal pain.  Genitourinary: Negative for dysuria.  Musculoskeletal: Negative for back pain and neck pain.  Skin: Positive for wound.  Neurological: Negative for headaches.  Hematological: Does not bruise/bleed easily.  Psychiatric/Behavioral: Negative for confusion.      Allergies  Review of patient's allergies indicates no known allergies.  Home Medications   Prior to Admission medications   Medication Sig Start Date End Date Taking? Authorizing Provider  aspirin EC 81  MG tablet Take 81 mg by mouth daily.     Yes Historical Provider, MD  clotrimazole-betamethasone (LOTRISONE) cream Apply 1 application topically 2 (two) times daily as needed. Itching     Yes Historical Provider, MD  escitalopram (LEXAPRO) 10 MG tablet Take 10 mg by mouth daily.   Yes Historical Provider, MD  fluticasone (FLONASE) 50 MCG/ACT nasal spray Place into both nostrils daily.   Yes Historical Provider, MD  haloperidol (HALDOL) 1 MG tablet Take 1 mg by mouth at bedtime.     Yes Historical Provider, MD  hydrOXYzine (ATARAX) 25 MG tablet Take 25 mg by mouth every 6 (six) hours as needed. itching    Yes Historical Provider, MD  insulin detemir (LEVEMIR) 100 UNIT/ML injection Inject 0.3 mLs (30 Units total) into the skin at bedtime. 12/08/14  Yes Erick Blinks, MD  insulin lispro (HUMALOG) 100 UNIT/ML injection Inject 15 Units into the skin 3 (three) times daily before meals.   Yes Historical Provider, MD  linagliptin (TRADJENTA) 5 MG TABS tablet Take 5 mg by mouth daily.   Yes Historical Provider, MD  loratadine (CLARITIN) 10 MG tablet Take 10 mg by mouth daily.   Yes Historical Provider, MD  Melatonin 5 MG CAPS Take 10 mg by mouth at bedtime.   Yes Historical Provider, MD  Tamsulosin HCl (FLOMAX) 0.4 MG CAPS Take 0.4 mg by mouth daily.     Yes Historical Provider, MD  Thiamine HCl (VITAMIN B-1) 100 MG tablet Take 100 mg by mouth daily.     Yes Historical Provider, MD  levofloxacin (LEVAQUIN) 750 MG tablet Take 1 tablet (750  mg total) by mouth daily. Patient not taking: Reported on 07/03/2015 12/08/14   Erick Blinks, MD  oseltamivir (TAMIFLU) 75 MG capsule Take 1 capsule (75 mg total) by mouth 2 (two) times daily. Patient not taking: Reported on 07/03/2015 12/08/14   Erick Blinks, MD   BP 181/75 mmHg  Pulse 70  Temp(Src) 98 F (36.7 C) (Oral)  Resp 16  SpO2 100% Physical Exam  Constitutional: He appears well-developed and well-nourished. No distress.  HENT:  Head: Normocephalic.   Mouth/Throat: Oropharynx is clear and moist.  Posterior scalp no step off. with 1.5 cm laceration  Eyes: Conjunctivae and EOM are normal. Pupils are equal, round, and reactive to light.  Neck: Normal range of motion.  Cardiovascular: Normal rate, regular rhythm and normal heart sounds.   No murmur heard. Pulmonary/Chest: Effort normal and breath sounds normal. No respiratory distress.  Abdominal: Soft. Bowel sounds are normal. There is no tenderness.  Musculoskeletal: Normal range of motion.  Neurological: He is alert. No cranial nerve deficit. He exhibits normal muscle tone. Coordination normal.  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  Nursing note and vitals reviewed.   ED Course  Procedures (including critical care time) Labs Review Labs Reviewed - No data to display  Imaging Review Ct Head Wo Contrast  07/03/2015   CLINICAL DATA:  81 year old nursing home patient who fell while outside, striking his head against a wall. Laceration to the back of the head. Initial encounter.  EXAM: CT HEAD WITHOUT CONTRAST  CT CERVICAL SPINE WITHOUT CONTRAST  TECHNIQUE: Multidetector CT imaging of the head and cervical spine was performed following the standard protocol without intravenous contrast. Multiplanar CT image reconstructions of the cervical spine were also generated.  COMPARISON:  Prior head CTs dating back to 08/30/2004, most recently 05/26/2013. No prior cervical spine imaging.  FINDINGS: CT HEAD FINDINGS  Severe cortical atrophy, severe cerebellar atrophy and moderate to severe deep atrophy, progressive since 2005 but unchanged since 2014. Physiologic calcification in the left basal ganglia. Calcification along the tentorium. Mild changes of small vessel disease of the white matter diffusely, unchanged. No mass lesion. No midline shift. No acute hemorrhage or hematoma. No extra-axial fluid collections. No evidence of acute infarction.  No skull fracture or other focal osseous abnormality involving the  skull. Prior bilateral maxillary medial antrostomies. Opacification of ethmoid air cells bilaterally. Frontal sinuses and sphenoid sinuses well aerated. Bilateral mastoid air cells and middle ear cavities well-aerated. Bilateral carotid siphon and vertebral artery atherosclerosis. Phthisis bulbi involving the left globe.  CT CERVICAL SPINE FINDINGS  Patient motion blurred images of the lower cervical spine initially, but these were repeated and a diagnostic study was obtained.  No fractures identified involving the cervical spine. Moderate to severe disc space narrowing and endplate hypertrophic changes and C3-4, C4-5, C5-6 and C6-7, worst at C4-5. Moderate to severe multifactorial spinal stenosis at C4-5 and C5-6. Facet joints intact throughout with degenerative changes. Coronal reformatted images demonstrate an intact craniocervical junction, intact C1-C2 articulation with degenerative changes, intact dens and intact lateral masses. Combination of facet and uncinate hypertrophy account for multilevel foraminal stenoses including severe bilateral C3-4, severe bilateral C4-5, severe bilateral C5-6, and moderate left and mild right C6-7. Broad-based central disc protrusion at C3-4 with desiccated disc material.  IMPRESSION: 1. No acute intracranial abnormality. 2. Moderate to severe generalized atrophy and mild chronic microvascular ischemic changes of the white matter. 3. No cervical spine fractures identified. 4. Multilevel degenerative disc disease, spondylosis and facet degenerative changes involving the  cervical spine with moderate to severe multifactorial spinal stenosis at C4-5 and C5-6. Multilevel foraminal stenoses as detailed above. 5. Chronic bilateral ethmoid sinus disease.   Electronically Signed   By: Hulan Saas M.D.   On: 07/03/2015 15:16   Ct Cervical Spine Wo Contrast  07/03/2015   CLINICAL DATA:  81 year old nursing home patient who fell while outside, striking his head against a wall.  Laceration to the back of the head. Initial encounter.  EXAM: CT HEAD WITHOUT CONTRAST  CT CERVICAL SPINE WITHOUT CONTRAST  TECHNIQUE: Multidetector CT imaging of the head and cervical spine was performed following the standard protocol without intravenous contrast. Multiplanar CT image reconstructions of the cervical spine were also generated.  COMPARISON:  Prior head CTs dating back to 08/30/2004, most recently 05/26/2013. No prior cervical spine imaging.  FINDINGS: CT HEAD FINDINGS  Severe cortical atrophy, severe cerebellar atrophy and moderate to severe deep atrophy, progressive since 2005 but unchanged since 2014. Physiologic calcification in the left basal ganglia. Calcification along the tentorium. Mild changes of small vessel disease of the white matter diffusely, unchanged. No mass lesion. No midline shift. No acute hemorrhage or hematoma. No extra-axial fluid collections. No evidence of acute infarction.  No skull fracture or other focal osseous abnormality involving the skull. Prior bilateral maxillary medial antrostomies. Opacification of ethmoid air cells bilaterally. Frontal sinuses and sphenoid sinuses well aerated. Bilateral mastoid air cells and middle ear cavities well-aerated. Bilateral carotid siphon and vertebral artery atherosclerosis. Phthisis bulbi involving the left globe.  CT CERVICAL SPINE FINDINGS  Patient motion blurred images of the lower cervical spine initially, but these were repeated and a diagnostic study was obtained.  No fractures identified involving the cervical spine. Moderate to severe disc space narrowing and endplate hypertrophic changes and C3-4, C4-5, C5-6 and C6-7, worst at C4-5. Moderate to severe multifactorial spinal stenosis at C4-5 and C5-6. Facet joints intact throughout with degenerative changes. Coronal reformatted images demonstrate an intact craniocervical junction, intact C1-C2 articulation with degenerative changes, intact dens and intact lateral masses.  Combination of facet and uncinate hypertrophy account for multilevel foraminal stenoses including severe bilateral C3-4, severe bilateral C4-5, severe bilateral C5-6, and moderate left and mild right C6-7. Broad-based central disc protrusion at C3-4 with desiccated disc material.  IMPRESSION: 1. No acute intracranial abnormality. 2. Moderate to severe generalized atrophy and mild chronic microvascular ischemic changes of the white matter. 3. No cervical spine fractures identified. 4. Multilevel degenerative disc disease, spondylosis and facet degenerative changes involving the cervical spine with moderate to severe multifactorial spinal stenosis at C4-5 and C5-6. Multilevel foraminal stenoses as detailed above. 5. Chronic bilateral ethmoid sinus disease.   Electronically Signed   By: Hulan Saas M.D.   On: 07/03/2015 15:16   I have personally reviewed and evaluated these images and lab results as part of my medical decision-making.   EKG Interpretation None      LACERATION REPAIR Performed by: Vanetta Mulders Authorized by: Vanetta Mulders Consent: Verbal consent obtained. Risks and benefits: risks, benefits and alternatives were discussed Consent given by: patient Patient identity confirmed: provided demographic data Prepped and Draped in normal sterile fashion Wound explored  Laceration Location: Posterior scalp  Laceration Length: 1.5 cm  No Foreign Bodies seen or palpated  Anesthesia: local infiltration  Local anesthetic: None   Anesthetic total:   Irrigation method: syringe Amount of cleaning: standard  Skin closure: Surgical staples   Number of sutures: 2   Technique:  Patient tolerance: Patient tolerated the procedure well  with no immediate complications.    MDM   Final diagnoses:  Fall, initial encounter  Scalp laceration, initial encounter    Patient status post fall CT of head and neck without any acute brain or bony injuries. Patient with a 1.5 cm  laceration to the posterior part of the scalp wound was cleaned and closed with 2 surgical staples.     Vanetta Mulders, MD 07/03/15 1631  Vanetta Mulders, MD 07/03/15 [phone]

## 2015-07-03 NOTE — ED Notes (Signed)
Pt is from [facility].  Was outside and fell hitting head against the wall.  Laceration to back of head.  Pt denies any complaints.

## 2015-07-03 NOTE — Discharge Instructions (Signed)
Head CT and CT of neck without any acute findings. Patient with a 1.5 cm laceration to the posterior part of the scalp. That was repaired with 2 surgical staples. Patient will need to have these removed in 7-10 days.

## 2015-07-16 ENCOUNTER — Emergency Department (HOSPITAL_COMMUNITY)
Admission: EM | Admit: 2015-07-16 | Discharge: 2015-07-16 | Disposition: A | Discharge status: Home / Self Care | Payer: Medicare Other | Attending: Emergency Medicine | Admitting: Emergency Medicine

## 2015-07-16 ENCOUNTER — Encounter (HOSPITAL_COMMUNITY): Payer: Self-pay | Admitting: *Deleted

## 2015-07-16 ENCOUNTER — Emergency Department (HOSPITAL_COMMUNITY): Payer: Medicare Other

## 2015-07-16 DIAGNOSIS — W01198A Fall on same level from slipping, tripping and stumbling with subsequent striking against other object, initial encounter: Secondary | ICD-10-CM | POA: Insufficient documentation

## 2015-07-16 DIAGNOSIS — G20 Parkinson's disease: Secondary | ICD-10-CM | POA: Diagnosis not present

## 2015-07-16 DIAGNOSIS — F0390 Unspecified dementia without behavioral disturbance: Secondary | ICD-10-CM | POA: Insufficient documentation

## 2015-07-16 DIAGNOSIS — Z7951 Long term (current) use of inhaled steroids: Secondary | ICD-10-CM | POA: Diagnosis not present

## 2015-07-16 DIAGNOSIS — Y998 Other external cause status: Secondary | ICD-10-CM | POA: Insufficient documentation

## 2015-07-16 DIAGNOSIS — I442 Atrioventricular block, complete: Secondary | ICD-10-CM | POA: Diagnosis not present

## 2015-07-16 DIAGNOSIS — Z7982 Long term (current) use of aspirin: Secondary | ICD-10-CM | POA: Insufficient documentation

## 2015-07-16 DIAGNOSIS — S0990XA Unspecified injury of head, initial encounter: Secondary | ICD-10-CM | POA: Diagnosis not present

## 2015-07-16 DIAGNOSIS — G4700 Insomnia, unspecified: Secondary | ICD-10-CM | POA: Insufficient documentation

## 2015-07-16 DIAGNOSIS — Z87891 Personal history of nicotine dependence: Secondary | ICD-10-CM | POA: Diagnosis not present

## 2015-07-16 DIAGNOSIS — I10 Essential (primary) hypertension: Secondary | ICD-10-CM | POA: Insufficient documentation

## 2015-07-16 DIAGNOSIS — N400 Enlarged prostate without lower urinary tract symptoms: Secondary | ICD-10-CM | POA: Diagnosis not present

## 2015-07-16 DIAGNOSIS — Y9289 Other specified places as the place of occurrence of the external cause: Secondary | ICD-10-CM | POA: Diagnosis not present

## 2015-07-16 DIAGNOSIS — Y9389 Activity, other specified: Secondary | ICD-10-CM | POA: Insufficient documentation

## 2015-07-16 DIAGNOSIS — E119 Type 2 diabetes mellitus without complications: Secondary | ICD-10-CM | POA: Diagnosis not present

## 2015-07-16 DIAGNOSIS — Z79899 Other long term (current) drug therapy: Secondary | ICD-10-CM | POA: Diagnosis not present

## 2015-07-16 DIAGNOSIS — Z794 Long term (current) use of insulin: Secondary | ICD-10-CM | POA: Insufficient documentation

## 2015-07-16 DIAGNOSIS — R58 Hemorrhage, not elsewhere classified: Secondary | ICD-10-CM | POA: Diagnosis present

## 2015-07-16 DIAGNOSIS — Z4802 Encounter for removal of sutures: Secondary | ICD-10-CM | POA: Diagnosis not present

## 2015-07-16 LAB — BASIC METABOLIC PANEL
Anion gap: 9 (ref 5–15)
BUN: 21 mg/dL — AB (ref 6–20)
CHLORIDE: 100 mmol/L — AB (ref 101–111)
CO2: 30 mmol/L (ref 22–32)
CREATININE: 1.02 mg/dL (ref 0.61–1.24)
Calcium: 8.5 mg/dL — ABNORMAL LOW (ref 8.9–10.3)
GFR calc non Af Amer: >60 mL/min (ref >60)
Glucose, Bld: 96 mg/dL (ref 65–99)
Potassium: 3.4 mmol/L — ABNORMAL LOW (ref 3.5–5.1)
Sodium: 139 mmol/L (ref 135–145)

## 2015-07-16 LAB — CBC WITH DIFFERENTIAL/PLATELET
BAND NEUTROPHILS: 0 %
BASOS ABS: 0.1 10*3/uL (ref 0.0–0.1)
BASOS PCT: 1 %
BLASTS: 0 %
EOS ABS: 0.6 10*3/uL (ref 0.0–0.7)
Eosinophils Relative: 5 %
HEMATOCRIT: 40.5 % (ref 39.0–52.0)
Hemoglobin: 13.4 g/dL (ref 13.0–17.0)
Lymphocytes Relative: 29 %
Lymphs Abs: 3.4 10*3/uL (ref 0.7–4.0)
MCH: 28.8 pg (ref 26.0–34.0)
MCHC: 33.1 g/dL (ref 30.0–36.0)
MCV: 86.9 fL (ref 78.0–100.0)
METAMYELOCYTES PCT: 0 %
MONO ABS: 0.9 10*3/uL (ref 0.1–1.0)
Monocytes Relative: 8 %
Myelocytes: 0 %
Neutro Abs: 6.7 10*3/uL (ref 1.7–7.7)
Neutrophils Relative %: 57 %
Other: 0 %
PROMYELOCYTES ABS: 0 %
Platelets: 219 10*3/uL (ref 150–400)
RBC: 4.66 MIL/uL (ref 4.22–5.81)
RDW: 12.7 % (ref 11.5–15.5)
WBC: 11.7 10*3/uL — ABNORMAL HIGH (ref 4.0–10.5)
nRBC: 0 /100 WBC

## 2015-07-16 LAB — TROPONIN I: Troponin I: <0.03 ng/mL (ref <0.031)

## 2015-07-16 NOTE — Discharge Instructions (Signed)
Suture Removal, Care After Refer to this sheet in the next few weeks. These instructions provide you with information on caring for yourself after your procedure. Your health care provider may also give you more specific instructions. Your treatment has been planned according to current medical practices, but problems sometimes occur. Call your health care provider if you have any problems or questions after your procedure. WHAT TO EXPECT AFTER THE PROCEDURE After your stitches (sutures) are removed, it is typical to have the following:  Some discomfort and swelling in the wound area.  Slight redness in the area. HOME CARE INSTRUCTIONS   If you have skin adhesive strips over the wound area, do not take the strips off. They will fall off on their own in a few days. If the strips remain in place after 14 days, you may remove them.  Change any bandages (dressings) at least once a day or as directed by your health care provider. If the bandage sticks, soak it off with warm, soapy water.  Apply cream or ointment only as directed by your health care provider. If using cream or ointment, wash the area with soap and water 2 times a day to remove all the cream or ointment. Rinse off the soap and pat the area dry with a clean towel.  Keep the wound area dry and clean. If the bandage becomes wet or dirty, or if it develops a bad smell, change it as soon as possible.  Continue to protect the wound from injury.  Use sunscreen when out in the sun. New scars become sunburned easily. SEEK MEDICAL CARE IF:  You have increasing redness, swelling, or pain in the wound.  You see pus coming from the wound.  You have a fever.  You notice a bad smell coming from the wound or dressing.  Your wound breaks open (edges not staying together). Document Released: 06/25/2001 Document Revised: 07/21/2013 Document Reviewed: 05/12/2013 [facility] Patient Information 2015 [place], [place]. This information is not  intended to replace advice given to you by your health care provider. Make sure you discuss any questions you have with your health care provider.  Third Degree Atrioventricular Block Third degree atrioventricular block is a type of heart block. The heartbeat is a coordinated contraction between the upper and lower chambers of the heart. This coordinated contraction happens because of an electrical impulse that is sent from the upper chambers of the heart to the lower chambers of the heart. Normally, this electrical impulse is transmitted without problems. In third degree heart block, also known as complete heart block, the heart's electrical impulse does not pass from the upper chambers of the heart to the lower chambers of the heart. The heart's lower chambers beat independently from the upper chambers. This causes the heart to beat at a very slow rate and does not allow the heart to pump blood very well. Third degree heart block is serious and can result in death.  CAUSES   Heart attack. A heart attack can cause scarring which can damage the heart's electrical system.  Aging can cause degeneration and scarring of the heart's electrical system.  Heart medication such as beta blockers or calcium channel blockers. These kinds of medications can slow the heart rate. They can affect the electrical impulse of the heart if the dosage is too high.  Open heart surgery can affect the electrical pathway of the heart. SYMPTOMS   Symptoms may include the following:  Fainting or near fainting.  Feeling dizzy or  lightheaded.  Difficulty breathing or shortness or breath.  Chest pain.  Fatigue.  Swelling of the lower legs. DIAGNOSIS  Third degree heart block can be diagnosed by:   An electrocardiogram (EKG). An EKG is a tracing of the heartbeat and can show a 3rd degree heart block.  Electrophysiology (EP) study. This is a procedure which tests the electrical pathway of your heart. This type of  test is done by a specialist who places a catheter (long thin tube) in your heart. The catheter is used to study your heart and record your heart's electrical signals. TREATMENT   3rd degree heart block requires hospitalization and continuous heart rhythm monitoring.  Most people with 3rd degree heart block will need a permanent pacemaker to help your heart beat.  If a permanent pacemaker cannot be placed immediately, a temporary pacemaker may be needed.  Heart medications such beta blockers or calcium channel blockers can slow the heart rate. Your caregiver may need to adjust your heart medication if this is the cause of your heart block. SEEK MEDICAL CARE IF:   You have unexplained fatigue.  You feel lightheaded.  You feel faint.  You feel your heart skipping beats or your heart beats very fast. SEEK IMMEDIATE MEDICAL CARE IF:   You have severe chest pain, especially if the pain is crushing or pressure-like and spreads to the arms, back, neck, or jaw. THIS IS AN EMERGENCY. Do not wait to see if the pain will go away. Get medical help at once. Call your local emergency services (911 in the [place] DO NOT drive yourself to the hospital.  You notice increasing shortness of breath during rest, sleeping, or with activity.  You "black out" or faint.  You have swelling in your lower legs. MAKE SURE YOU:   Understand these instructions.  Will watch your condition.  Will get help right away if you are not doing well or get worse. Document Released: 09/12/2008 Document Revised: 12/23/2011 Document Reviewed: 09/12/2008 [facility] Patient Information 2015 [place], [place]. This information is not intended to replace advice given to you by your health care provider. Make sure you discuss any questions you have with your health care provider.  Scar Minimization You will have a scar anytime you have surgery and a cut is made in the skin or you have something removed from your skin (mole, skin  cancer, cyst). Although scars are unavoidable following surgery, there are ways to minimize their appearance. It is important to follow all the instructions you receive from your caregiver about wound care. How your wound heals will influence the appearance of your scar. If you do not follow the wound care instructions as directed, complications such as infection may occur. Wound instructions include keeping the wound clean, moist, and not letting the wound form a scab. Some people form scars that are raised and lumpy (hypertrophic) or larger than the initial wound (keloidal). HOME CARE INSTRUCTIONS   Follow wound care instructions as directed.  Keep the wound clean by washing it with soap and water.  Keep the wound moist with provided antibiotic cream or petroleum jelly until completely healed. Moisten twice a day for about 2 weeks.  Get stitches (sutures) taken out at the scheduled time.  Avoid touching or manipulating your wound unless needed. Wash your hands thoroughly before and after touching your wound.  Follow all restrictions such as limits on exercise or work. This depends on where your scar is located.  Keep the scar protected from sunburn. Cover  the scar with sunscreen/sunblock with SPF 30 or higher.  Gently massage the scar using a circular motion to help minimize the appearance of the scar. Do this only after the wound has closed and all the sutures have been removed.  For hypertrophic or keloidal scars, there are several ways to treat and minimize their appearance. Methods include compression therapy, intralesional corticosteroids, laser therapy, or surgery. These methods are performed by your caregiver. Remember that the scar may appear lighter or darker than your normal skin color. This difference in color should even out with time. SEEK MEDICAL CARE IF:   You have a fever.  You develop signs of infection such as pain, redness, pus, and warmth.  You have questions or  concerns. Document Released: 03/20/2010 Document Revised: 12/23/2011 Document Reviewed: 03/20/2010 [facility] Patient Information 2015 [place], [place]. This information is not intended to replace advice given to you by your health care provider. Make sure you discuss any questions you have with your health care provider.

## 2015-07-16 NOTE — ED Notes (Signed)
Pt comes in from [facility]. Pt was recently seen for a fall and got 2 staples in his scalp. Pt fell today and hit the scab on his head, small amount of bleeding is noticed.   Primary MD at bedside

## 2015-07-16 NOTE — ED Provider Notes (Addendum)
CSN: [id number]     Arrival date & time 07/16/15  1821 History   First MD Initiated Contact with Patient 07/16/15 1822     Chief Complaint  Patient presents with  . Bradycardia  . Fall     (Consider location/radiation/quality/duration/timing/severity/associated sxs/prior Treatment) HPI Comments: Patient sent to the emergency department for evaluation of bleeding from previous had wound. Patient was seen in the ER for nearly 2 weeks ago for a fall. He had staples placed in his scalp. Today he was trying to put his shoes on and fell backwards, hit the back of his head on the wall. He had a small amount of bleeding from the site, was sent to the ER for further evaluation.  Patient is a 81 y.o. male presenting with fall.  Fall    Past Medical History  Diagnosis Date  . Essential hypertension, benign   . Type 2 diabetes mellitus (HCC)   . Dementia   . Parkinson's disease   . Insomnia   . Benign prostate hyperplasia    History reviewed. No pertinent past surgical history. No family history on file. Social History  Substance Use Topics  . Smoking status: Former Smoker -- 0.50 packs/day for 4 years    Types: Cigarettes  . Smokeless tobacco: Never Used  . Alcohol Use: No    Review of Systems  Skin: Positive for wound.  All other systems reviewed and are negative.     Allergies  Review of patient's allergies indicates no known allergies.  Home Medications   Prior to Admission medications   Medication Sig Start Date End Date Taking? Authorizing Provider  aspirin EC 81 MG tablet Take 81 mg by mouth daily.      Historical Provider, MD  clotrimazole-betamethasone (LOTRISONE) cream Apply 1 application topically 2 (two) times daily as needed. Itching      Historical Provider, MD  escitalopram (LEXAPRO) 10 MG tablet Take 10 mg by mouth daily.    Historical Provider, MD  fluticasone (FLONASE) 50 MCG/ACT nasal spray Place into both nostrils daily.    Historical Provider, MD   haloperidol (HALDOL) 1 MG tablet Take 1 mg by mouth at bedtime.      Historical Provider, MD  hydrOXYzine (ATARAX) 25 MG tablet Take 25 mg by mouth every 6 (six) hours as needed. itching     Historical Provider, MD  insulin detemir (LEVEMIR) 100 UNIT/ML injection Inject 0.3 mLs (30 Units total) into the skin at bedtime. 12/08/14   Erick Blinks, MD  insulin lispro (HUMALOG) 100 UNIT/ML injection Inject 15 Units into the skin 3 (three) times daily before meals.    Historical Provider, MD  levofloxacin (LEVAQUIN) 750 MG tablet Take 1 tablet (750 mg total) by mouth daily. Patient not taking: Reported on 07/03/2015 12/08/14   Erick Blinks, MD  linagliptin (TRADJENTA) 5 MG TABS tablet Take 5 mg by mouth daily.    Historical Provider, MD  loratadine (CLARITIN) 10 MG tablet Take 10 mg by mouth daily.    Historical Provider, MD  Melatonin 5 MG CAPS Take 10 mg by mouth at bedtime.    Historical Provider, MD  oseltamivir (TAMIFLU) 75 MG capsule Take 1 capsule (75 mg total) by mouth 2 (two) times daily. Patient not taking: Reported on 07/03/2015 12/08/14   Erick Blinks, MD  Tamsulosin HCl (FLOMAX) 0.4 MG CAPS Take 0.4 mg by mouth daily.      Historical Provider, MD  Thiamine HCl (VITAMIN B-1) 100 MG tablet Take 100 mg by mouth  daily.      Historical Provider, MD   BP 154/99 mmHg  Pulse 67  Temp(Src) 99.6 F (37.6 C) (Oral)  Resp 16  SpO2 96% Physical Exam  Constitutional: He is oriented to person, place, and time. He appears well-developed and well-nourished. No distress.  HENT:  Head: Normocephalic and atraumatic.  Right Ear: Hearing normal.  Left Ear: Hearing normal.  Nose: Nose normal.  Mouth/Throat: Oropharynx is clear and moist and mucous membranes are normal.  Eyes: Conjunctivae and EOM are normal. Pupils are equal, round, and reactive to light.  Neck: Normal range of motion. Neck supple.  Cardiovascular: Regular rhythm, S1 normal and S2 normal.  Bradycardia present.  Exam reveals no  gallop and no friction rub.   No murmur heard. Pulmonary/Chest: Effort normal and breath sounds normal. No respiratory distress. He exhibits no tenderness.  Abdominal: Soft. Normal appearance and bowel sounds are normal. There is no hepatosplenomegaly. There is no tenderness. There is no rebound, no guarding, no tenderness at McBurney's point and negative Murphy's sign. No hernia.  Musculoskeletal: Normal range of motion.  Neurological: He is alert and oriented to person, place, and time. He has normal strength. No cranial nerve deficit or sensory deficit. Coordination normal. GCS eye subscore is 4. GCS verbal subscore is 5. GCS motor subscore is 6.  Skin: Skin is warm, dry and intact. No rash noted. No cyanosis.  Psychiatric: He has a normal mood and affect. His speech is normal and behavior is normal. Thought content normal.  Nursing note and vitals reviewed.   ED Course  Procedures (including critical care time) Labs Review Labs Reviewed  CBC WITH DIFFERENTIAL/PLATELET - Abnormal; Notable for the following:    WBC 11.7 (*)    All other components within normal limits  BASIC METABOLIC PANEL  TROPONIN I    Imaging Review Dg Chest Port 1 View  07/16/2015   CLINICAL DATA:  Weakness, status post fall recently.  EXAM: PORTABLE CHEST 1 VIEW  COMPARISON:  12/07/2014  FINDINGS: There is a different per later pad overlying the left chest wall limiting evaluation. There is mild bilateral interstitial prominence. There is mild right basilar atelectasis. There is no focal parenchymal opacity. There is no pleural effusion or pneumothorax. The heart and mediastinal contours are unremarkable.  The osseous structures are unremarkable.  IMPRESSION: No active disease.   Electronically Signed   By: Elige Ko   On: 07/16/2015 18:54   I have personally reviewed and evaluated these images and lab results as part of my medical decision-making.   EKG Interpretation   Date/Time:  Sunday July 16 2015  18:27:16 EDT Ventricular Rate:  39 PR Interval:    QRS Duration: 151 QT Interval:  560 QTC Calculation: 451 R Axis:   -66 Text Interpretation:  Complete AV block with wide QRS complex Confirmed by  Franciscojavier Wronski  MD, Rasmus Preusser [phone]) on 07/16/2015 7:25:59 PM      EKG Interpretation  Date/Time:  Sunday July 16 2015 19:02:53 EDT Ventricular Rate:  64 PR Interval:  266 QRS Duration: 131 QT Interval:  437 QTC Calculation: 451 R Axis:   -77 Text Interpretation:  Sinus rhythm Prolonged PR interval Right bundle branch block Left ventricular hypertrophy Reconfirmed by Avianah Pellman  MD, Huberta Tompkins ([id number]) on 07/16/2015 7:28:07 PM        MDM   Final diagnoses:  Complete heart block (HCC)  Visit for suture removal    Patient presented to the ER for evaluation after minor injury to head.  Patient bumped the back of his head and started bleeding from the area where there is a scab where he has staples from a fall that occurred on September 19. He is awake and alert, does not require imaging. Staples were removed. No bleeding noted.  Patient was noted to be bradycardic at arrival. EKG was performed and shows complete heart block. Patient is asymptomatic. This was discussed with cardiology. Dr. Tresa Endo did not recommend transfer the [facility] at this time, does not feel that the patient is a good candidate for pacemaker. Patient was monitored here in the ER and after approximately 45 minutes converted back to a sinus rhythm. No other arrhythmias noted. Basic workup was negative. Based on this, it is felt that the complete heart block was incidental, does not require treatment at this time, per cardiology. Will discharge back to nursing home.    Gilda Crease, MD 07/16/15 1916  Gilda Crease, MD 07/16/15 1927  Gilda Crease, MD 07/16/15 Serena Croissant

## 2015-07-16 NOTE — ED Notes (Signed)
Emergency Contact: Lupita Leash Coffin's number is dis connected.  Nurse called facility to see who POA is and they do not have one on file. They have no next of kin for [place] to contact as well. They only have case managers contact: [phone] ext. [phone], Dorcas Mcmurray. Message left on her phone to contact us back.

## 2015-09-21 LAB — HEMOGLOBIN A1C: HEMOGLOBIN A1C: 8.3 % — AB (ref 4.0–6.0)

## 2015-09-28 ENCOUNTER — Encounter: Payer: Self-pay | Admitting: "Endocrinology

## 2015-09-28 ENCOUNTER — Ambulatory Visit (INDEPENDENT_AMBULATORY_CARE_PROVIDER_SITE_OTHER): Payer: Medicare Other | Admitting: "Endocrinology

## 2015-09-28 VITALS — BP 170/92 | HR 93 | Ht 62.0 in | Wt 162.0 lb

## 2015-09-28 DIAGNOSIS — I10 Essential (primary) hypertension: Secondary | ICD-10-CM | POA: Diagnosis not present

## 2015-09-28 DIAGNOSIS — E119 Type 2 diabetes mellitus without complications: Secondary | ICD-10-CM | POA: Diagnosis not present

## 2015-09-28 MED ORDER — HYDROCHLOROTHIAZIDE 25 MG PO TABS: 25.0000 mg | ORAL_TABLET | Freq: Every day | ORAL | Status: DC

## 2015-09-28 MED ORDER — INSULIN DETEMIR 100 UNIT/ML ~~LOC~~ SOLN: 40.0000 [IU] | Freq: Every day | SUBCUTANEOUS | Status: AC

## 2015-09-28 NOTE — Progress Notes (Signed)
Subjective:    Patient ID: Cody [name], male    DOB: 04-Oct-1933, PCP No primary care provider on file.   Past Medical History  Diagnosis Date  . Essential hypertension, benign   . Type 2 diabetes mellitus (HCC)   . Dementia   . Parkinson's disease   . Insomnia   . Benign prostate hyperplasia    History reviewed. No pertinent past surgical history. Social History   Social History  . Marital Status: Single    Spouse Name: N/A  . Number of Children: N/A  . Years of Education: N/A   Social History Main Topics  . Smoking status: Former Smoker -- 0.50 packs/day for 4 years    Types: Cigarettes  . Smokeless tobacco: Never Used  . Alcohol Use: No  . Drug Use: No  . Sexual Activity: Not Currently   Other Topics Concern  . None   Social History Narrative   Outpatient Encounter Prescriptions as of 09/28/2015  Medication Sig  . aspirin EC 81 MG tablet Take 81 mg by mouth daily.    . clotrimazole-betamethasone (LOTRISONE) cream Apply 1 application topically 2 (two) times daily as needed. Itching    . escitalopram (LEXAPRO) 10 MG tablet Take 10 mg by mouth daily.  . fluticasone (FLONASE) 50 MCG/ACT nasal spray Place into both nostrils daily.  . haloperidol (HALDOL) 1 MG tablet Take 1 mg by mouth at bedtime.    . hydrOXYzine (ATARAX) 25 MG tablet Take 25 mg by mouth every 6 (six) hours as needed. itching   . insulin detemir (LEVEMIR) 100 UNIT/ML injection Inject 0.4 mLs (40 Units total) into the skin at bedtime.  . insulin lispro (HUMALOG) 100 UNIT/ML injection Inject 15-21 Units into the skin 3 (three) times daily before meals.  Marland Kitchen linagliptin (TRADJENTA) 5 MG TABS tablet Take 5 mg by mouth daily.  Marland Kitchen loratadine (CLARITIN) 10 MG tablet Take 10 mg by mouth daily.  . Melatonin 5 MG CAPS Take 10 mg by mouth at bedtime.  . Tamsulosin HCl (FLOMAX) 0.4 MG CAPS Take 0.4 mg by mouth daily.    . temazepam (RESTORIL) 15 MG capsule Take 15 mg by mouth at bedtime as needed for  sleep.  . Thiamine HCl (VITAMIN B-1) 100 MG tablet Take 100 mg by mouth daily.    . [DISCONTINUED] insulin detemir (LEVEMIR) 100 UNIT/ML injection Inject 0.3 mLs (30 Units total) into the skin at bedtime. (Patient taking differently: Inject 40 Units into the skin at bedtime. )  . hydrochlorothiazide (HYDRODIURIL) 25 MG tablet Take 1 tablet (25 mg total) by mouth daily.  Marland Kitchen levofloxacin (LEVAQUIN) 750 MG tablet Take 1 tablet (750 mg total) by mouth daily. (Patient not taking: Reported on 07/03/2015)  . oseltamivir (TAMIFLU) 75 MG capsule Take 1 capsule (75 mg total) by mouth 2 (two) times daily. (Patient not taking: Reported on 07/03/2015)   No facility-administered encounter medications on file as of 09/28/2015.   ALLERGIES: Allergies  Allergen Reactions  . Sulfa Antibiotics    VACCINATION STATUS:  There is no immunization history on file for this patient.  Diabetes He presents for his follow-up diabetic visit. He has type 2 diabetes mellitus. There are no hypoglycemic associated symptoms. Pertinent negatives for hypoglycemia include no confusion, headaches, pallor or seizures. There are no diabetic associated symptoms. Pertinent negatives for diabetes include no chest pain, no fatigue, no polydipsia, no polyphagia, no polyuria and no weakness. There are no hypoglycemic complications. Symptoms are stable. There are no diabetic  complications. Risk factors for coronary artery disease include diabetes mellitus, hypertension and sedentary lifestyle. Current diabetic treatment includes insulin injections. He is compliant with treatment most of the time. His weight is stable. He is following a generally unhealthy diet. When asked about meal planning, he reported none. He has not had a previous visit with a dietitian. He never participates in exercise. His breakfast blood glucose range is generally 180-200 mg/dl. His lunch blood glucose range is generally 180-200 mg/dl. His dinner blood glucose range is  generally 180-200 mg/dl. His overall blood glucose range is 180-200 mg/dl. An ACE inhibitor/angiotensin II receptor blocker is not being taken.  Hypertension This is a chronic problem. The current episode started more than 1 year ago. Pertinent negatives include no chest pain, headaches, neck pain, palpitations or shortness of breath. Risk factors for coronary artery disease include diabetes mellitus, male gender and sedentary lifestyle. Past treatments include diuretics.     Review of Systems  Constitutional: Negative for fatigue and unexpected weight change.  HENT: Negative for dental problem, mouth sores and trouble swallowing.   Eyes: Negative for visual disturbance.  Respiratory: Negative for cough, choking, chest tightness, shortness of breath and wheezing.   Cardiovascular: Negative for chest pain, palpitations and leg swelling.  Gastrointestinal: Negative for nausea, vomiting, abdominal pain, diarrhea, constipation and abdominal distention.  Endocrine: Negative for polydipsia, polyphagia and polyuria.  Genitourinary: Negative for dysuria, urgency, hematuria and flank pain.  Musculoskeletal: Positive for gait problem. Negative for myalgias, back pain and neck pain.       Walks with a walker.  Skin: Negative for pallor, rash and wound.  Neurological: Negative for seizures, syncope, weakness, numbness and headaches.  Psychiatric/Behavioral: Negative.  Negative for confusion and dysphoric mood.    Objective:    BP 170/92 mmHg  Pulse 93  Ht 5\' 2"  (1.575 m)  Wt 162 lb (73.483 kg)  BMI 29.62 kg/m2  SpO2 95%  Wt Readings from Last 3 Encounters:  09/28/15 162 lb (73.483 kg)  12/06/14 159 lb 6.3 oz (72.3 kg)  08/12/13 173 lb (78.472 kg)    Physical Exam  Constitutional: He is oriented to person, place, and time. He appears well-developed and well-nourished. He is cooperative. No distress.  Uses a walker.   HENT:  Head: Normocephalic and atraumatic.  Eyes: EOM are normal.   Neck: Normal range of motion. Neck supple. No tracheal deviation present. No thyromegaly present.  Cardiovascular: Normal rate, S1 normal, S2 normal and normal heart sounds.  Exam reveals no gallop.   No murmur heard. Pulses:      Dorsalis pedis pulses are 1+ on the right side, and 1+ on the left side.       Posterior tibial pulses are 1+ on the right side, and 1+ on the left side.  Pulmonary/Chest: Breath sounds normal. No respiratory distress. He has no wheezes.  Abdominal: Soft. Bowel sounds are normal. He exhibits no distension. There is no tenderness. There is no guarding and no CVA tenderness.  Musculoskeletal: He exhibits no edema.       Right shoulder: He exhibits no swelling and no deformity.  Neurological: He is alert and oriented to person, place, and time. He has normal strength and normal reflexes. A sensory deficit is present. No cranial nerve deficit. Gait normal.  Skin: Skin is warm and dry. No rash noted. No cyanosis. Nails show no clubbing.  Psychiatric: He has a normal mood and affect. His speech is normal and behavior is normal. Judgment and  thought content normal. Cognition and memory are normal.     Complete Blood Count (Most recent): Lab Results  Component Value Date   WBC 11.7* 07/16/2015   HGB 13.4 07/16/2015   HCT 40.5 07/16/2015   MCV 86.9 07/16/2015   PLT 219 07/16/2015   Chemistry (most recent): Lab Results  Component Value Date   NA 139 07/16/2015   K 3.4* 07/16/2015   CL 100* 07/16/2015   CO2 30 07/16/2015   BUN 21* 07/16/2015   CREATININE 1.02 07/16/2015   Diabetic Labs (most recent): Lab Results  Component Value Date   HGBA1C 8.3* 09/21/2015   HGBA1C 7.6* 12/05/2014   Lipid profile (most recent): No results found for: TRIG, CHOL       Assessment & Plan:   1. Diabetes mellitus without complication (HCC) - patient remains at a high risk for more acute and chronic complications of diabetes which include CAD, CVA, CKD, retinopathy, and  neuropathy. These are all discussed in detail with the patient and his patient care technician.  Patient came with above target glucose profile, and  recent A1c of 8.3 %.  Glucose logs and insulin administration records pertaining to this visit,  to be scanned into patient's records.  Recent labs reviewed.   - I have re-counseled the patient on diet management   by adopting a carbohydrate restricted / protein rich  Diet.  - Suggestion is made for patient to avoid simple carbohydrates   from their diet including Cakes , Desserts, Ice Cream,  Soda (  diet and regular) , Sweet Tea , Candies,  Chips, Cookies, Artificial Sweeteners,   and "Sugar-free" Products .  This will help patient to have stable blood glucose profile and potentially avoid unintended  Weight gain.  - Patient is advised to stick to a routine mealtimes to eat 3 meals  a day and avoid unnecessary snacks ( to snack only to correct hypoglycemia).  - I have approached patient with the following individualized plan to manage diabetes and patient agrees.  I will continue Levemir 40 units qhs, Novolog to 15 units TIDAC plus SSI.  -I advised for strict monitoring of glucose before meals and at bedtime. He will continue on Tradjenta 5 mg po qday.  - Patient specific target  for A1c; LDL, HDL, Triglycerides, and  Waist Circumference were discussed in detail.  2) BP/HTN: Uncontrolled, I have initiated hydrochlorothiazide 25 mg by mouth every morning.  3)  Weight/Diet: Carbohydrates information provided.  5) Chronic Care/Health Maintenance:  -Patient's care taker is advised to arrange for visit with Ophthalmology, Podiatrist at least yearly or according to recommendations, and advised to  stay away from smoking. I have recommended yearly flu vaccine and pneumonia vaccination at least every 5 years; moderate intensity exercise for up to 150 minutes weekly; and  sleep for at least 7 hours a day.  - 25 minutes of time was spent on the care  of this patient , 50% of which was applied for counseling on diabetes complications and their preventions.  - I advised patient to maintain close follow up with No primary care provider on file. for primary care needs.  Patient is asked to bring meter and  blood glucose logs during their next visit.   Follow up plan: -Return in about 3 months (around 12/27/2015) for diabetes, high blood pressure, follow up with pre-visit labs, meter, and logs.  Marquis Lunch, MD Phone: [phone]  Fax: [phone]   09/28/2015, 9:50 PM

## 2015-10-02 ENCOUNTER — Encounter: Payer: Medicare Other | Admitting: Cardiology

## 2015-10-02 ENCOUNTER — Encounter: Payer: Self-pay | Admitting: Cardiology

## 2015-10-02 NOTE — Progress Notes (Signed)
Patient ID: Montel CulverRussell E Riggio, male   DOB: Jun 25, 1933, 81 y.o.   MRN: [id number]     ERROR Cancelled

## 2015-10-05 ENCOUNTER — Encounter: Payer: Self-pay | Admitting: "Endocrinology

## 2015-10-12 ENCOUNTER — Encounter: Payer: Self-pay | Admitting: Cardiology

## 2015-10-12 ENCOUNTER — Ambulatory Visit (INDEPENDENT_AMBULATORY_CARE_PROVIDER_SITE_OTHER): Payer: Medicare Other | Admitting: Cardiology

## 2015-10-12 VITALS — BP 156/74 | HR 78 | Ht 62.0 in | Wt 158.0 lb

## 2015-10-12 DIAGNOSIS — I10 Essential (primary) hypertension: Secondary | ICD-10-CM | POA: Diagnosis not present

## 2015-10-12 DIAGNOSIS — R001 Bradycardia, unspecified: Secondary | ICD-10-CM | POA: Diagnosis not present

## 2015-10-12 NOTE — Patient Instructions (Signed)
Your physician wants you to follow-up in: 6 months with NP You will receive a reminder letter in the mail two months in advance. If you don't receive a letter, please call our office to schedule the follow-up appointment.    [facility]    Your physician recommends that you continue on your current medications as directed. Please refer to the Current Medication list given to you today.   Thank you for choosing [facility] !

## 2015-10-12 NOTE — Progress Notes (Signed)
Patient ID: Cody [name], male   DOB: 05/10/1933, 81 y.o.   MRN: [id number]      Clinical Summary Cody [name] is a 81 y.o.male seen today for follow up of the following medical problems.   1.Complete heart block - patient seen in ER after a mechanical fall. Noted to be in complete heart block transiently, spontaneously converted back to NSR. He was on no AV nodal agents. K 3.4, no TSH in system. Based on his significant dementia it was decided during that ER visit not to pursue pacemaker - he denies any recent lightheadedness or dizziness. No recent syncope.    Past Medical History  Diagnosis Date  . Essential hypertension, benign   . Type 2 diabetes mellitus (HCC)   . Dementia   . Parkinson's disease   . Insomnia   . Benign prostate hyperplasia      Allergies  Allergen Reactions  . Sulfa Antibiotics      Current Outpatient Prescriptions  Medication Sig Dispense Refill  . aspirin EC 81 MG tablet Take 81 mg by mouth daily.      . clotrimazole-betamethasone (LOTRISONE) cream Apply 1 application topically 2 (two) times daily as needed. Itching      . escitalopram (LEXAPRO) 10 MG tablet Take 10 mg by mouth daily.    . fluticasone (FLONASE) 50 MCG/ACT nasal spray Place into both nostrils daily.    . haloperidol (HALDOL) 1 MG tablet Take 1 mg by mouth at bedtime.      . hydrochlorothiazide (HYDRODIURIL) 25 MG tablet Take 1 tablet (25 mg total) by mouth daily. 30 tablet 2  . hydrOXYzine (ATARAX) 25 MG tablet Take 25 mg by mouth every 6 (six) hours as needed. itching     . insulin detemir (LEVEMIR) 100 UNIT/ML injection Inject 0.4 mLs (40 Units total) into the skin at bedtime. 10 mL 3  . insulin lispro (HUMALOG) 100 UNIT/ML injection Inject 15-21 Units into the skin 3 (three) times daily before meals.    Marland Kitchen levofloxacin (LEVAQUIN) 750 MG tablet Take 1 tablet (750 mg total) by mouth daily. (Patient not taking: Reported on 07/03/2015) 2 tablet 0  . linagliptin (TRADJENTA) 5 MG TABS  tablet Take 5 mg by mouth daily.    Marland Kitchen loratadine (CLARITIN) 10 MG tablet Take 10 mg by mouth daily.    . Melatonin 5 MG CAPS Take 10 mg by mouth at bedtime.    Marland Kitchen oseltamivir (TAMIFLU) 75 MG capsule Take 1 capsule (75 mg total) by mouth 2 (two) times daily. (Patient not taking: Reported on 07/03/2015) 4 capsule 0  . Tamsulosin HCl (FLOMAX) 0.4 MG CAPS Take 0.4 mg by mouth daily.      . temazepam (RESTORIL) 15 MG capsule Take 15 mg by mouth at bedtime as needed for sleep.    . Thiamine HCl (VITAMIN B-1) 100 MG tablet Take 100 mg by mouth daily.       No current facility-administered medications for this visit.        Allergies  Allergen Reactions  . Sulfa Antibiotics       He denies any family history of heart disease   Social History Cody [name] reports that he has quit smoking. His smoking use included Cigarettes. He has a 2 pack-year smoking history. He has never used smokeless tobacco. Cody [name] reports that he does not drink alcohol.   Review of Systems CONSTITUTIONAL: No weight loss, fever, chills, weakness or fatigue.  HEENT: Eyes: No visual loss, blurred vision, double  vision or yellow sclerae.No hearing loss, sneezing, congestion, runny nose or sore throat.  SKIN: No rash or itching.  CARDIOVASCULAR: no chest pain, no palpitations RESPIRATORY: No shortness of breath, cough or sputum.  GASTROINTESTINAL: No anorexia, nausea, vomiting or diarrhea. No abdominal pain or blood.  GENITOURINARY: No burning on urination, no polyuria NEUROLOGICAL: No headache, dizziness, syncope, paralysis, ataxia, numbness or tingling in the extremities. No change in bowel or bladder control.  MUSCULOSKELETAL: No muscle, back pain, joint pain or stiffness.  LYMPHATICS: No enlarged nodes. No history of splenectomy.  PSYCHIATRIC: No history of depression or anxiety.  ENDOCRINOLOGIC: No reports of sweating, cold or heat intolerance. No polyuria or polydipsia.  Marland Kitchen.   Physical Examination Filed  Vitals:   10/12/15 1359  BP: 156/74  Pulse: 78   Filed Vitals:   10/12/15 1359  Height: 5\' 2"  (1.575 m)  Weight: 158 lb (71.668 kg)    Gen: resting comfortably, no acute distress HEENT: no scleral icterus, pupils equal round and reactive, no palptable cervical adenopathy,  CV: regular, rate 40, no m/r/g Resp: Clear to auscultation bilaterally GI: abdomen is soft, non-tender, non-distended, normal bowel sounds, no hepatosplenomegaly MSK: extremities are warm, no edema.  Skin: warm, no rash Neuro:  no focal deficits Psych: appropriate affect   Diagnostic Studies  05/2013 EKG: sinus, rate 70, LAFB, non-spec conduction delay, 1st degree heart block, LAE, non-spec ST-T  06/2013 Event monitor: difficulty with compliance due to mental status, no symptoms reported, no significant arrhythmias.   07/06/13 Cody: LVEF 60-65%, grade I diastolic dysfunction,   Clinic EKG (reviewed in clinic today): complete heart block, probably junctional escape with nonspecific conduction delay  Assessment and Plan   1. Complete heart  Block - no current symptoms. Patient with advanced dementia and overall poor functional status. EKG with stable junctional escape rhythm. Would not pursue pacemaker at this time, continue to follow clinically. We will check a TSH for him.      Antoine PocheJonathan F. Tikesha Mort, M.D.

## 2015-10-18 LAB — TSH: TSH: 3.438 u[IU]/mL (ref 0.350–4.500)

## 2015-11-20 ENCOUNTER — Encounter: Payer: Self-pay | Admitting: Internal Medicine

## 2015-11-30 ENCOUNTER — Encounter (HOSPITAL_COMMUNITY)
Admission: EM | Disposition: A | Discharge status: Home Health | Payer: Self-pay | Source: Home / Self Care | Attending: Internal Medicine

## 2015-11-30 ENCOUNTER — Inpatient Hospital Stay (HOSPITAL_COMMUNITY): Payer: Medicare Other

## 2015-11-30 ENCOUNTER — Encounter (HOSPITAL_COMMUNITY): Payer: Self-pay | Admitting: Emergency Medicine

## 2015-11-30 ENCOUNTER — Inpatient Hospital Stay (HOSPITAL_COMMUNITY)
Admission: EM | Admit: 2015-11-30 | Discharge: 2015-12-04 | DRG: 242 | Disposition: A | Discharge status: Home Health | Payer: Medicare Other | Attending: Internal Medicine | Admitting: Internal Medicine

## 2015-11-30 DIAGNOSIS — Z87891 Personal history of nicotine dependence: Secondary | ICD-10-CM

## 2015-11-30 DIAGNOSIS — R001 Bradycardia, unspecified: Secondary | ICD-10-CM | POA: Diagnosis present

## 2015-11-30 DIAGNOSIS — Z79899 Other long term (current) drug therapy: Secondary | ICD-10-CM | POA: Diagnosis not present

## 2015-11-30 DIAGNOSIS — I462 Cardiac arrest due to underlying cardiac condition: Secondary | ICD-10-CM | POA: Diagnosis not present

## 2015-11-30 DIAGNOSIS — R112 Nausea with vomiting, unspecified: Secondary | ICD-10-CM | POA: Diagnosis present

## 2015-11-30 DIAGNOSIS — D72829 Elevated white blood cell count, unspecified: Secondary | ICD-10-CM | POA: Diagnosis present

## 2015-11-30 DIAGNOSIS — G4700 Insomnia, unspecified: Secondary | ICD-10-CM | POA: Diagnosis present

## 2015-11-30 DIAGNOSIS — G20 Parkinson's disease: Secondary | ICD-10-CM | POA: Diagnosis present

## 2015-11-30 DIAGNOSIS — R55 Syncope and collapse: Secondary | ICD-10-CM | POA: Diagnosis present

## 2015-11-30 DIAGNOSIS — R269 Unspecified abnormalities of gait and mobility: Secondary | ICD-10-CM | POA: Diagnosis present

## 2015-11-30 DIAGNOSIS — Z7982 Long term (current) use of aspirin: Secondary | ICD-10-CM | POA: Diagnosis not present

## 2015-11-30 DIAGNOSIS — I459 Conduction disorder, unspecified: Secondary | ICD-10-CM | POA: Diagnosis not present

## 2015-11-30 DIAGNOSIS — Z22322 Carrier or suspected carrier of Methicillin resistant Staphylococcus aureus: Secondary | ICD-10-CM | POA: Diagnosis not present

## 2015-11-30 DIAGNOSIS — F0390 Unspecified dementia without behavioral disturbance: Secondary | ICD-10-CM | POA: Diagnosis present

## 2015-11-30 DIAGNOSIS — N400 Enlarged prostate without lower urinary tract symptoms: Secondary | ICD-10-CM | POA: Diagnosis present

## 2015-11-30 DIAGNOSIS — I10 Essential (primary) hypertension: Secondary | ICD-10-CM | POA: Diagnosis present

## 2015-11-30 DIAGNOSIS — Z959 Presence of cardiac and vascular implant and graft, unspecified: Secondary | ICD-10-CM

## 2015-11-30 DIAGNOSIS — Z881 Allergy status to other antibiotic agents status: Secondary | ICD-10-CM

## 2015-11-30 DIAGNOSIS — H548 Legal blindness, as defined in USA: Secondary | ICD-10-CM | POA: Diagnosis present

## 2015-11-30 DIAGNOSIS — I469 Cardiac arrest, cause unspecified: Secondary | ICD-10-CM

## 2015-11-30 DIAGNOSIS — Z794 Long term (current) use of insulin: Secondary | ICD-10-CM | POA: Diagnosis not present

## 2015-11-30 DIAGNOSIS — E119 Type 2 diabetes mellitus without complications: Secondary | ICD-10-CM | POA: Diagnosis present

## 2015-11-30 DIAGNOSIS — E1159 Type 2 diabetes mellitus with other circulatory complications: Secondary | ICD-10-CM

## 2015-11-30 DIAGNOSIS — T17908A Unspecified foreign body in respiratory tract, part unspecified causing other injury, initial encounter: Secondary | ICD-10-CM

## 2015-11-30 DIAGNOSIS — I442 Atrioventricular block, complete: Principal | ICD-10-CM | POA: Diagnosis present

## 2015-11-30 DIAGNOSIS — N179 Acute kidney failure, unspecified: Secondary | ICD-10-CM | POA: Diagnosis present

## 2015-11-30 HISTORY — PX: CARDIAC CATHETERIZATION: SHX172

## 2015-11-30 LAB — CBC WITH DIFFERENTIAL/PLATELET
BASOS PCT: 0 %
Basophils Absolute: 0 10*3/uL (ref 0.0–0.1)
EOS ABS: 0.2 10*3/uL (ref 0.0–0.7)
EOS PCT: 2 %
HEMATOCRIT: 36.1 % — AB (ref 39.0–52.0)
HEMOGLOBIN: 11.7 g/dL — AB (ref 13.0–17.0)
Lymphocytes Relative: 23 %
Lymphs Abs: 2 10*3/uL (ref 0.7–4.0)
MCH: 28.7 pg (ref 26.0–34.0)
MCHC: 32.4 g/dL (ref 30.0–36.0)
MCV: 88.5 fL (ref 78.0–100.0)
MONO ABS: 0.9 10*3/uL (ref 0.1–1.0)
MONOS PCT: 10 %
Neutro Abs: 5.8 10*3/uL (ref 1.7–7.7)
Neutrophils Relative %: 65 %
Platelets: 159 10*3/uL (ref 150–400)
RBC: 4.08 MIL/uL — ABNORMAL LOW (ref 4.22–5.81)
RDW: 13.4 % (ref 11.5–15.5)
WBC: 8.9 10*3/uL (ref 4.0–10.5)

## 2015-11-30 LAB — CBC
HCT: 38.7 % — ABNORMAL LOW (ref 39.0–52.0)
Hemoglobin: 12.2 g/dL — ABNORMAL LOW (ref 13.0–17.0)
MCH: 28.4 pg (ref 26.0–34.0)
MCHC: 31.5 g/dL (ref 30.0–36.0)
MCV: 90.2 fL (ref 78.0–100.0)
PLATELETS: 147 10*3/uL — AB (ref 150–400)
RBC: 4.29 MIL/uL (ref 4.22–5.81)
RDW: 13.7 % (ref 11.5–15.5)
WBC: 14.6 10*3/uL — AB (ref 4.0–10.5)

## 2015-11-30 LAB — BASIC METABOLIC PANEL
ANION GAP: 11 (ref 5–15)
BUN: 36 mg/dL — ABNORMAL HIGH (ref 6–20)
CO2: 27 mmol/L (ref 22–32)
CREATININE: 1.27 mg/dL — AB (ref 0.61–1.24)
Calcium: 8.9 mg/dL (ref 8.9–10.3)
Chloride: 102 mmol/L (ref 101–111)
GFR calc Af Amer: 60 mL/min — ABNORMAL LOW (ref >60)
GFR, EST NON AFRICAN AMERICAN: 52 mL/min — AB (ref >60)
GLUCOSE: 420 mg/dL — AB (ref 65–99)
Potassium: 4.5 mmol/L (ref 3.5–5.1)
Sodium: 140 mmol/L (ref 135–145)

## 2015-11-30 LAB — GLUCOSE, CAPILLARY: Glucose-Capillary: 420 mg/dL — ABNORMAL HIGH (ref 65–99)

## 2015-11-30 LAB — TROPONIN I: TROPONIN I: 0.04 ng/mL — AB (ref <0.031)

## 2015-11-30 LAB — CBG MONITORING, ED: GLUCOSE-CAPILLARY: 356 mg/dL — AB (ref 65–99)

## 2015-11-30 SURGERY — TEMPORARY PACEMAKER
Anesthesia: LOCAL

## 2015-11-30 MED ORDER — ONDANSETRON HCL 4 MG/2ML IJ SOLN
4.0000 mg | Freq: Once | INTRAMUSCULAR | Status: AC
  Administered 2015-11-30: 4 mg via INTRAMUSCULAR

## 2015-11-30 MED ORDER — LIDOCAINE HCL (PF) 1 % IJ SOLN
INTRAMUSCULAR | Status: AC
  Filled 2015-11-30: qty 30

## 2015-11-30 MED ORDER — ONDANSETRON HCL 4 MG/2ML IJ SOLN: 4.0000 mg | Freq: Once | INTRAMUSCULAR | Status: DC

## 2015-11-30 MED ORDER — DOBUTAMINE IN D5W 4-5 MG/ML-% IV SOLN
INTRAVENOUS | Status: AC
  Administered 2015-11-30: 5 ug/kg/min via INTRAVENOUS
  Filled 2015-11-30: qty 250

## 2015-11-30 MED ORDER — SODIUM CHLORIDE 0.9% FLUSH
3.0000 mL | Freq: Two times a day (BID) | INTRAVENOUS | Status: DC
  Administered 2015-11-30 – 2015-12-01 (×2): 3 mL via INTRAVENOUS

## 2015-11-30 MED ORDER — HYDROCHLOROTHIAZIDE 25 MG PO TABS
25.0000 mg | ORAL_TABLET | Freq: Every day | ORAL | Status: DC
  Administered 2015-12-01 – 2015-12-04 (×4): 25 mg via ORAL
  Filled 2015-11-30 (×4): qty 1

## 2015-11-30 MED ORDER — SODIUM CHLORIDE 0.9 % IV BOLUS (SEPSIS)
250.0000 mL | Freq: Once | INTRAVENOUS | Status: AC
  Administered 2015-11-30: 250 mL via INTRAVENOUS

## 2015-11-30 MED ORDER — ASPIRIN 81 MG PO CHEW
81.0000 mg | CHEWABLE_TABLET | Freq: Every day | ORAL | Status: DC
  Administered 2015-12-01 – 2015-12-04 (×4): 81 mg via ORAL
  Filled 2015-11-30 (×4): qty 1

## 2015-11-30 MED ORDER — ESCITALOPRAM OXALATE 10 MG PO TABS: 10.0000 mg | ORAL_TABLET | Freq: Every day | ORAL | Status: DC

## 2015-11-30 MED ORDER — ONDANSETRON HCL 4 MG/2ML IJ SOLN
INTRAMUSCULAR | Status: AC
Start: 2015-11-30 — End: 2015-12-01
  Filled 2015-11-30: qty 2

## 2015-11-30 MED ORDER — MIDAZOLAM HCL 2 MG/2ML IJ SOLN: 1.0000 mg | INTRAMUSCULAR | Status: DC | PRN

## 2015-11-30 MED ORDER — TEMAZEPAM 15 MG PO CAPS
15.0000 mg | ORAL_CAPSULE | Freq: Every evening | ORAL | Status: DC | PRN
  Administered 2015-12-01 – 2015-12-03 (×3): 15 mg via ORAL
  Filled 2015-11-30 (×3): qty 1

## 2015-11-30 MED ORDER — METOCLOPRAMIDE HCL 5 MG/ML IJ SOLN
10.0000 mg | Freq: Three times a day (TID) | INTRAMUSCULAR | Status: DC
  Administered 2015-12-01 – 2015-12-02 (×5): 10 mg via INTRAVENOUS
  Filled 2015-11-30 (×5): qty 2

## 2015-11-30 MED ORDER — INSULIN ASPART 100 UNIT/ML ~~LOC~~ SOLN
0.0000 [IU] | Freq: Three times a day (TID) | SUBCUTANEOUS | Status: DC
  Administered 2015-12-01 – 2015-12-02 (×2): 4 [IU] via SUBCUTANEOUS
  Administered 2015-12-03: 11 [IU] via SUBCUTANEOUS
  Administered 2015-12-04: 3 [IU] via SUBCUTANEOUS
  Administered 2015-12-04: 7 [IU] via SUBCUTANEOUS

## 2015-11-30 MED ORDER — HEPARIN (PORCINE) IN NACL 2-0.9 UNIT/ML-% IJ SOLN
INTRAMUSCULAR | Status: AC
  Filled 2015-11-30: qty 1000

## 2015-11-30 MED ORDER — ONDANSETRON HCL 4 MG/2ML IJ SOLN
INTRAMUSCULAR | Status: AC
  Filled 2015-11-30: qty 2

## 2015-11-30 MED ORDER — DOBUTAMINE IN D5W 4-5 MG/ML-% IV SOLN
2.5000 ug/kg/min | INTRAVENOUS | Status: DC
  Administered 2015-11-30: 5 ug/kg/min via INTRAVENOUS

## 2015-11-30 MED ORDER — ATROPINE SULFATE 0.1 MG/ML IJ SOLN
0.5000 mg | Freq: Once | INTRAMUSCULAR | Status: DC
  Filled 2015-11-30 (×2): qty 10

## 2015-11-30 MED ORDER — ASPIRIN EC 81 MG PO TBEC: 81.0000 mg | DELAYED_RELEASE_TABLET | Freq: Every day | ORAL | Status: DC

## 2015-11-30 MED ORDER — TAMSULOSIN HCL 0.4 MG PO CAPS
0.4000 mg | ORAL_CAPSULE | Freq: Every day | ORAL | Status: DC
  Administered 2015-12-01 – 2015-12-04 (×4): 0.4 mg via ORAL
  Filled 2015-11-30 (×4): qty 1

## 2015-11-30 MED ORDER — ISOPROTERENOL HCL 0.2 MG/ML IJ SOLN
2.0000 ug/min | INTRAVENOUS | Status: DC
  Administered 2015-11-30: 2 ug/min via INTRAVENOUS
  Filled 2015-11-30: qty 5

## 2015-11-30 MED ORDER — HEPARIN SODIUM (PORCINE) 5000 UNIT/ML IJ SOLN
5000.0000 [IU] | Freq: Three times a day (TID) | INTRAMUSCULAR | Status: DC
  Administered 2015-12-01 – 2015-12-03 (×9): 5000 [IU] via SUBCUTANEOUS
  Filled 2015-11-30 (×8): qty 1

## 2015-11-30 MED ORDER — SODIUM CHLORIDE 0.9% FLUSH
3.0000 mL | Freq: Two times a day (BID) | INTRAVENOUS | Status: DC
  Administered 2015-11-30 – 2015-12-03 (×4): 3 mL via INTRAVENOUS

## 2015-11-30 MED ORDER — INSULIN DETEMIR 100 UNIT/ML ~~LOC~~ SOLN
20.0000 [IU] | Freq: Every day | SUBCUTANEOUS | Status: DC
  Administered 2015-12-01 – 2015-12-03 (×4): 20 [IU] via SUBCUTANEOUS
  Filled 2015-11-30 (×5): qty 0.2

## 2015-11-30 MED ORDER — HALOPERIDOL 1 MG PO TABS
1.0000 mg | ORAL_TABLET | Freq: Every day | ORAL | Status: DC
  Administered 2015-12-01 – 2015-12-03 (×4): 1 mg via ORAL
  Filled 2015-11-30 (×7): qty 1

## 2015-11-30 MED ORDER — SODIUM CHLORIDE 0.9 % IV SOLN: INTRAVENOUS | Status: DC

## 2015-11-30 MED ORDER — INSULIN ASPART 100 UNIT/ML ~~LOC~~ SOLN
0.0000 [IU] | Freq: Every day | SUBCUTANEOUS | Status: DC
  Administered 2015-12-01: 5 [IU] via SUBCUTANEOUS
  Administered 2015-12-03: 2 [IU] via SUBCUTANEOUS

## 2015-11-30 MED ORDER — SODIUM CHLORIDE 0.9 % IV SOLN
INTRAVENOUS | Status: DC
  Administered 2015-11-30 – 2015-12-02 (×2): via INTRAVENOUS

## 2015-11-30 MED ORDER — DOPAMINE-DEXTROSE 3.2-5 MG/ML-% IV SOLN
INTRAVENOUS | Status: AC
  Filled 2015-11-30: qty 250

## 2015-11-30 MED ORDER — ONDANSETRON HCL 4 MG/2ML IJ SOLN
4.0000 mg | Freq: Once | INTRAMUSCULAR | Status: AC
  Administered 2015-11-30: 4 mg via INTRAVENOUS

## 2015-11-30 MED ORDER — DOPAMINE-DEXTROSE 3.2-5 MG/ML-% IV SOLN: 5.0000 ug/kg/min | INTRAVENOUS | Status: DC

## 2015-11-30 MED ORDER — FENTANYL CITRATE (PF) 100 MCG/2ML IJ SOLN
INTRAMUSCULAR | Status: AC
  Filled 2015-11-30: qty 2

## 2015-11-30 SURGICAL SUPPLY — 4 items
CATH S G BIP PACING (SET/KITS/TRAYS/PACK) ×1 IMPLANT
PACK CARDIAC CATHETERIZATION (CUSTOM PROCEDURE TRAY) ×1 IMPLANT
SHEATH PINNACLE 6F 10CM (SHEATH) ×1 IMPLANT
SLEEVE REPOSITIONING LENGTH 30 (MISCELLANEOUS) ×1 IMPLANT

## 2015-11-30 NOTE — ED Notes (Signed)
[facility] here for transport.  

## 2015-11-30 NOTE — H&P (Signed)
[facility] History and Physical  HUNG RHINESMITH WUJ:[id number] DOB: 07-02-1933 DOA: 11/30/2015  Referring physician: Dr Deretha Emory PCP: No primary care provider on file.   Chief Complaint: syncope  HPI: Cody [name] is a 82 y.o. male   Level 5 caveat : Pt unable to participate fully in exam due to dementia and intractable vomiting throughout examination. Pt w/ poor verbal abilities  She provided a limited by the patient, but more fully by notes from the nursing home and by the EDP.  In talking to the patient all he is able to say is that he's been throwing up, and that he feel sick for 3 days.   Patient brought from nursing home facility by EMS. Patient currently had a syncopal episode after went from sitting to standing and began walking. This is not unusual for the patient. Patient was divided by EMS and was noted to have complete heart block. Patient has some decrease in his mental status with some confusion at time of event at the nursing home.  While in the deep ED patient apparently had an episode of asystole and unresponsiveness that lasted 5 seconds and then resolved without intervention..   Review of Systems:  Unabelt o obtain further due to pts presenting w/ acute illness adn lack of mental engagement.   Past Medical History  Diagnosis Date  . Essential hypertension, benign   . Type 2 diabetes mellitus (HCC)   . Dementia   . Parkinson's disease   . Insomnia   . Benign prostate hyperplasia     Unable to obtain further FMHx, Social Hx, or surgical history.   History reviewed. No pertinent past surgical history. Social History:  reports that he has quit smoking. His smoking use included Cigarettes. He has a 2 pack-year smoking history. He has never used smokeless tobacco. He reports that he does not drink alcohol or use illicit drugs.  Allergies  Allergen Reactions  . Sulfa Antibiotics     Family History  Problem Relation Age of Onset  . Family  history unknown: Yes     Prior to Admission medications   Medication Sig Start Date End Date Taking? Authorizing Provider  aspirin EC 81 MG tablet Take 81 mg by mouth daily.     Yes Historical Provider, MD  Camphor-Eucalyptus-Menthol (MEDICATED CHEST RUB EX) Apply 1 application topically 2 (two) times daily.   Yes Historical Provider, MD  escitalopram (LEXAPRO) 10 MG tablet Take 10 mg by mouth daily.   Yes Historical Provider, MD  fluticasone (FLONASE) 50 MCG/ACT nasal spray Place into both nostrils daily.   Yes Historical Provider, MD  haloperidol (HALDOL) 1 MG tablet Take 1 mg by mouth at bedtime.     Yes Historical Provider, MD  hydrochlorothiazide (HYDRODIURIL) 25 MG tablet Take 1 tablet (25 mg total) by mouth daily. 09/28/15  Yes Roma Kayser, MD  insulin detemir (LEVEMIR) 100 UNIT/ML injection Inject 0.4 mLs (40 Units total) into the skin at bedtime. 09/28/15  Yes Roma Kayser, MD  insulin lispro (HUMALOG) 100 UNIT/ML injection Inject 15-21 Units into the skin 3 (three) times daily before meals.   Yes Historical Provider, MD  linagliptin (TRADJENTA) 5 MG TABS tablet Take 5 mg by mouth daily.   Yes Historical Provider, MD  loratadine (CLARITIN) 10 MG tablet Take 10 mg by mouth daily.   Yes Historical Provider, MD  Melatonin 5 MG CAPS Take 10 mg by mouth at bedtime.   Yes Historical Provider, MD  Tamsulosin HCl (  FLOMAX) 0.4 MG CAPS Take 0.4 mg by mouth daily.     Yes Historical Provider, MD  temazepam (RESTORIL) 15 MG capsule Take 15 mg by mouth at bedtime as needed for sleep.   Yes Historical Provider, MD  Thiamine HCl (VITAMIN B-1) 100 MG tablet Take 100 mg by mouth daily.     Yes Historical Provider, MD  clotrimazole-betamethasone (LOTRISONE) cream Apply 1 application topically 2 (two) times daily as needed. Itching      Historical Provider, MD  hydrOXYzine (ATARAX) 25 MG tablet Take 25 mg by mouth every 6 (six) hours as needed. itching     Historical Provider, MD    levofloxacin (LEVAQUIN) 750 MG tablet Take 1 tablet (750 mg total) by mouth daily. Patient not taking: Reported on 11/30/2015 12/08/14   Erick Blinks, MD  oseltamivir (TAMIFLU) 75 MG capsule Take 1 capsule (75 mg total) by mouth 2 (two) times daily. Patient not taking: Reported on 11/30/2015 12/08/14   Erick Blinks, MD   Physical Exam: Filed Vitals:   11/30/15 1815 11/30/15 1830 11/30/15 1845 11/30/15 1900  BP:  129/74    Pulse:   33   Temp:      Resp: 21 15 16 17   Height:      Weight:      SpO2:   80%     Wt Readings from Last 3 Encounters:  11/30/15 72.576 kg (160 lb)  10/12/15 71.668 kg (158 lb)  09/28/15 73.483 kg (162 lb)    General: Appears ill  Eyes: L eye blind. R eye EOMI,  ENT: Dry MM NEck: FROM Cardiovascular:  RRR, II/VI systoli cmurmur Respiratory:  CTA bilaterally, no w/r/r. Normal respiratory effort. Abdomen:  soft, ntnd, actively vomiting in room Skin:  no rash or induration seen on limited exam Musculoskeletal:  Moves all extremities. Unable to evaluate further as pt unable to cotnrol vomiting while in the room Psychiatric: AOx2. Answers most questions appropriately, unable to give coherent answers for numerous questions. Patient follows basic commands. eurologic:  CN 2-12 grossly intact (L eye blind), moves all extremities in coordinated fashion.          Labs on Admission:  Basic Metabolic Panel:  Recent Labs Lab 11/30/15 1324  NA 140  K 4.5  CL 102  CO2 27  GLUCOSE 420*  BUN 36*  CREATININE 1.27*  CALCIUM 8.9   Liver Function Tests: No results for input(s): AST, ALT, ALKPHOS, BILITOT, PROT, ALBUMIN in the last 168 hours. No results for input(s): LIPASE, AMYLASE in the last 168 hours. No results for input(s): AMMONIA in the last 168 hours. CBC:  Recent Labs Lab 11/30/15 1324  WBC 8.9  NEUTROABS 5.8  HGB 11.7*  HCT 36.1*  MCV 88.5  PLT 159   Cardiac Enzymes: No results for input(s): CKTOTAL, CKMB, CKMBINDEX, TROPONINI in the  last 168 hours.  BNP (last 3 results) No results for input(s): BNP in the last 8760 hours.  ProBNP (last 3 results) No results for input(s): PROBNP in the last 8760 hours.   CREATININE: 1.27 mg/dL ABNORMAL (16/10/96 0454) Estimated creatinine clearance - 41.9 mL/min  CBG:  Recent Labs Lab 11/30/15 1300  GLUCAP 356*    Radiological Exams on Admission: No results found.  Assessment/Plan Active Problems:   Syncope   BPH (benign prostatic hyperplasia)   Diabetes mellitus without complication (HCC)   Essential hypertension, benign   Symptomatic bradycardia   Asystole (HCC)   Nausea and vomiting   AKI (acute kidney injury) (HCC)  Asystole/complete heart block: Patient with known history of bradycardia and complete heart block. Initially evaluated by cardiology to independent hospital, Dr. Wyline Mood felt the patient was stable and safe for discharge home. After evaluation patient had single episode of asystole and unresponsiveness which lasted approximately 5 seconds per nursing staff. Patient is not a pacemaker candidate per cardiology. EDP consulted cardiology at [facility] after episode requested patient be transferred for further management and workup. Patient remains a full code. - Stepdown - [facility] - Tele - cycle trop - continue w/ external pacers.  - Versed prn - Atropine x1 now  SYncope: chronic condition for pt. Likely due to above noted problem. No evidence of szr, significant metabolic derangement, stroke, or medication induced etiology, or significant infection. - [facility] - Tele - UA, UCX, BCX - CT head  Nausea and vomiting: viral etiology vs DM gastroparesis. WBC nml. No reported change in stools and abd nonttp - Reglan - No zofran of phenergan - IVF - CXR - concern for aspiration - Speech eval  AKI:Cr 1.27. Baseline 1.0. likely from GI loss and poor oral intake - IVF  HTN: - continue HCTZ  DM: - 1/2 dose levemir - SSI  Dementia: at baseline.  Difficult to assertain if pt truly has capacity. NH does not have a working number for next of kin. Number listed in Epic for emergency contact does not work.  - continue restoril, Haldol - Call case worker in am to try and locate family  BPH: - continue flomax  Code Status: FULL - continues to need clarification  DVT Prophylaxis: Hep Family Communication: Multiple attempts made to reach family through contact information listed in Epic and through contact information provided by the Nursing home.  Disposition Plan: Pending Improvement    Rembert Browe Shela Commons, MD Family Medicine [facility] [email or web address] Password [email or web address]

## 2015-11-30 NOTE — ED Notes (Signed)
Patient had 1 episode of vomiting. Patients clothing and linen changed.

## 2015-11-30 NOTE — ED Notes (Signed)
Pt began to cough and then vomited. Medicated for nausea. Pt denies other symptoms. Pt remains in heart block

## 2015-11-30 NOTE — ED Notes (Signed)
Spoke with Cody [name] and patient is Full Code.

## 2015-11-30 NOTE — ED Notes (Signed)
Pt vomiting. Medicated for n/v.

## 2015-11-30 NOTE — ED Notes (Signed)
Patient had episode of apnea and color change of blue/purple for approx. 5 second. Dr. Deretha Emory made aware. Patient reports of abdominal pain. Patient placed on 2 LPM of oxygen.

## 2015-11-30 NOTE — ED Provider Notes (Addendum)
CSN: [id number]     Arrival date & time 11/30/15  1249 History   First MD Initiated Contact with Patient 11/30/15 1301     Chief Complaint  Patient presents with  . Loss of Consciousness     (Consider location/radiation/quality/duration/timing/severity/associated sxs/prior Treatment) The history is provided by the patient, the EMS personnel and the nursing home.   82 year old male from nursing facility patient is a full code. Patient with a history of dementia blind in left eye hypertension diabetes and Parkinson's disease. Patient had a syncopal episode at the nursing facility chest compressions were started. By the time EMS arrived patient seemed to be normal. Patient was in a slow heart rate.  Patient has been evaluated before for syncope and complete heart block. Seen by cardiology not felt to be a good candidate for pacemaker.  Upon arrival patient was alert seems to be back to his baseline mental status was able to answer some questions. Patient normally is able to walk with a walker at the facility.  Past Medical History  Diagnosis Date  . Essential hypertension, benign   . Type 2 diabetes mellitus (HCC)   . Dementia   . Parkinson's disease   . Insomnia   . Benign prostate hyperplasia    History reviewed. No pertinent past surgical history. No family history on file. Social History  Substance Use Topics  . Smoking status: Former Smoker -- 0.50 packs/day for 4 years    Types: Cigarettes  . Smokeless tobacco: Never Used  . Alcohol Use: No    Review of Systems  Unable to perform ROS: Dementia  Eyes: Positive for visual disturbance.  Neurological: Positive for syncope.  Psychiatric/Behavioral: Positive for confusion.      Allergies  Sulfa antibiotics  Home Medications   Prior to Admission medications   Medication Sig Start Date End Date Taking? Authorizing Provider  aspirin EC 81 MG tablet Take 81 mg by mouth daily.     Yes Historical Provider, MD   Camphor-Eucalyptus-Menthol (MEDICATED CHEST RUB EX) Apply 1 application topically 2 (two) times daily.   Yes Historical Provider, MD  escitalopram (LEXAPRO) 10 MG tablet Take 10 mg by mouth daily.   Yes Historical Provider, MD  fluticasone (FLONASE) 50 MCG/ACT nasal spray Place into both nostrils daily.   Yes Historical Provider, MD  haloperidol (HALDOL) 1 MG tablet Take 1 mg by mouth at bedtime.     Yes Historical Provider, MD  hydrochlorothiazide (HYDRODIURIL) 25 MG tablet Take 1 tablet (25 mg total) by mouth daily. 09/28/15  Yes Roma Kayser, MD  insulin detemir (LEVEMIR) 100 UNIT/ML injection Inject 0.4 mLs (40 Units total) into the skin at bedtime. 09/28/15  Yes Roma Kayser, MD  insulin lispro (HUMALOG) 100 UNIT/ML injection Inject 15-21 Units into the skin 3 (three) times daily before meals.   Yes Historical Provider, MD  linagliptin (TRADJENTA) 5 MG TABS tablet Take 5 mg by mouth daily.   Yes Historical Provider, MD  loratadine (CLARITIN) 10 MG tablet Take 10 mg by mouth daily.   Yes Historical Provider, MD  Melatonin 5 MG CAPS Take 10 mg by mouth at bedtime.   Yes Historical Provider, MD  Tamsulosin HCl (FLOMAX) 0.4 MG CAPS Take 0.4 mg by mouth daily.     Yes Historical Provider, MD  temazepam (RESTORIL) 15 MG capsule Take 15 mg by mouth at bedtime as needed for sleep.   Yes Historical Provider, MD  Thiamine HCl (VITAMIN B-1) 100 MG tablet Take 100 mg  by mouth daily.     Yes Historical Provider, MD  clotrimazole-betamethasone (LOTRISONE) cream Apply 1 application topically 2 (two) times daily as needed. Itching      Historical Provider, MD  hydrOXYzine (ATARAX) 25 MG tablet Take 25 mg by mouth every 6 (six) hours as needed. itching     Historical Provider, MD  levofloxacin (LEVAQUIN) 750 MG tablet Take 1 tablet (750 mg total) by mouth daily. Patient not taking: Reported on 11/30/2015 12/08/14   Erick Blinks, MD  oseltamivir (TAMIFLU) 75 MG capsule Take 1 capsule (75 mg  total) by mouth 2 (two) times daily. Patient not taking: Reported on 11/30/2015 12/08/14   Erick Blinks, MD   BP 164/73 mmHg  Pulse 55  Temp(Src) 98 F (36.7 C)  Resp 18  Ht  (1.676 m)  Wt 72.576 kg  BMI 25.84 kg/m2  SpO2 92% Physical Exam  Constitutional: He appears well-developed and well-nourished. No distress.  HENT:  Head: Normocephalic and atraumatic.  Mouth/Throat: Oropharynx is clear and moist.  Eyes:  No left eye.  Neck: Neck supple.  Cardiovascular: Normal heart sounds.   No murmur heard. Bradycardic irregular  Pulmonary/Chest: Effort normal and breath sounds normal. No respiratory distress.  Abdominal: Soft. Bowel sounds are normal. There is no tenderness.  Musculoskeletal: Normal range of motion.  Neurological: He is alert. No cranial nerve deficit. He exhibits normal muscle tone. Coordination normal.  Alert but confused and seems to be baseline. No vision left eye  Skin: Skin is warm.  Nursing note and vitals reviewed.   ED Course  Procedures (including critical care time) Labs Review Labs Reviewed  BASIC METABOLIC PANEL - Abnormal; Notable for the following:    Glucose, Bld 420 (*)    BUN 36 (*)    Creatinine, Ser 1.27 (*)    GFR calc non Af Amer 52 (*)    GFR calc Af Amer 60 (*)    All other components within normal limits  CBC WITH DIFFERENTIAL/PLATELET - Abnormal; Notable for the following:    RBC 4.08 (*)    Hemoglobin 11.7 (*)    HCT 36.1 (*)    All other components within normal limits  CBG MONITORING, ED - Abnormal; Notable for the following:    Glucose-Capillary 356 (*)    All other components within normal limits   Results for orders placed or performed during the hospital encounter of 11/30/15  Basic metabolic panel  Result Value Ref Range   Sodium 140 135 - 145 mmol/L   Potassium 4.5 3.5 - 5.1 mmol/L   Chloride 102 101 - 111 mmol/L   CO2 27 22 - 32 mmol/L   Glucose, Bld 420 (H) 65 - 99 mg/dL   BUN 36 (H) 6 - 20 mg/dL    Creatinine, Ser 1.61 (H) 0.61 - 1.24 mg/dL   Calcium 8.9 8.9 - 09.6 mg/dL   GFR calc non Af Amer 52 (L) >60 mL/min   GFR calc Af Amer 60 (L) >60 mL/min   Anion gap 11 5 - 15  CBC with Differential/Platelet  Result Value Ref Range   WBC 8.9 4.0 - 10.5 K/uL   RBC 4.08 (L) 4.22 - 5.81 MIL/uL   Hemoglobin 11.7 (L) 13.0 - 17.0 g/dL   HCT 04.5 (L) 40.9 - 81.1 %   MCV 88.5 78.0 - 100.0 fL   MCH 28.7 26.0 - 34.0 pg   MCHC 32.4 30.0 - 36.0 g/dL   RDW 91.4 78.2 - 95.6 %  Platelets 159 150 - 400 K/uL   Neutrophils Relative % 65 %   Neutro Abs 5.8 1.7 - 7.7 K/uL   Lymphocytes Relative 23 %   Lymphs Abs 2.0 0.7 - 4.0 K/uL   Monocytes Relative 10 %   Monocytes Absolute 0.9 0.1 - 1.0 K/uL   Eosinophils Relative 2 %   Eosinophils Absolute 0.2 0.0 - 0.7 K/uL   Basophils Relative 0 %   Basophils Absolute 0.0 0.0 - 0.1 K/uL  CBG monitoring, ED  Result Value Ref Range   Glucose-Capillary 356 (H) 65 - 99 mg/dL     Imaging Review No results found. I have personally reviewed and evaluated these images and lab results as part of my medical decision-making.   EKG Interpretation   Date/Time:  Thursday November 30 2015 13:08:55 EST Ventricular Rate:  37 PR Interval:  138 QRS Duration: 153 QT Interval:  650 QTC Calculation: 510 R Axis:   -67 Text Interpretation:  Complete (3-degree) AV block Multiple ventricular  premature complexes Probable left atrial enlargement Left bundle branch  block Confirmed by Flavio Lindroth  MD, Rhetta Cleek ([id number]) on 11/30/2015 1:19:11 PM      CRITICAL CARE Performed by: Vanetta Mulders Total critical care time: 30 minutes Critical care time was exclusive of separately billable procedures and treating other patients. Critical care was necessary to treat or prevent imminent or life-threatening deterioration. Critical care was time spent personally by me on the following activities: development of treatment plan with patient and/or surrogate as well as nursing,  discussions with consultants, evaluation of patient's response to treatment, examination of patient, obtaining history from patient or surrogate, ordering and performing treatments and interventions, ordering and review of laboratory studies, ordering and review of radiographic studies, pulse oximetry and re-evaluation of patient's condition.     MDM   Final diagnoses:  Complete heart block (HCC)  Syncope, unspecified syncope type    Patient brought in by EMS following syncopal episode. Patient was in intermittent complete heart block with heart rhythm in the 30s however his blood pressure was fine and he seemed to be asymptomatic sitting up in the bed. Patient does have a history of dementia is fairly functional is a full code. Patient had been evaluated for this complete heart block in the past by cardiology with recommendation not to receive a pacemaker. Contacted Dr. Wyline Mood. [facility] cardiology and he came and evaluated the patient and agreed that patient seems to be tolerating the complete heart block fairly well and did not recommend pacemaker. Patient's heart rate did go back into a sinus rhythm with a heart rate in the 60s. However then following Dr. branch consultation patient had an episode of asystole turned cyanotic and slumped over. And then came back around into a complete heart block rhythm. This may be what is been going on. Discussed with cardiology at [facility] because Dr. branch no longer on call they recommended that [facility] to [facility] on the [facility] for evaluation and consultation by them.  Patient is now back in the carina complete heart block rhythm in the 30s and is again back to his baseline. We did call the nursing home to confirm that he is a full code. Pacer pads placed.  Hospitalist here will arrange transfer.    Vanetta Mulders, MD 11/30/15 1820  Vanetta Mulders, MD 11/30/15 Zollie Pee

## 2015-11-30 NOTE — Progress Notes (Signed)
I was notified by the nurse about patient being diaphoretic and had a syncopal episode. Patient was found to be in complete heart block. Blood pressure was in the 140 systolic. Heart rate in the 30s. Patient was examined at bedside. I reviewed patient's EKG and consulted on-call cardiologist Dr. Virgina Organ who had at this time recommended to start dobutamine. Cardiology will be seeing in consult. Attempted to reach family but unable to. Discussed with critical care.  Midge Minium.

## 2015-11-30 NOTE — ED Notes (Signed)
Attempted to give report of nurse at [facility]. Per nurse patient will need room assignment to be changed due to level of care.

## 2015-11-30 NOTE — Consult Note (Signed)
Primary cardiologist: Dr Dina Rich MD Consulting cardiologist: Dr Dina Rich MD Requesting physician: Dr Vanetta Mulders  Clinical Summary Mr. Burkhalter is a 82 y.o.male with advanced dementia, Parkinsons disease, legally blind, poor functional capacity requiring assistance with daily ADLs, history of bradycardia with intermittent complete heart block that was not treated with pacemaker due to his advanced dementia and poor functional status. He has also had issues with orthostasis by vitals in clinic, potentially related to his parkinsons or poorly controlled DM2. His imdur was stopped in the past because of this.   He reports he stood up in the kitchen to walk to his room and suddenly felt lightheaded and dizzy and fell. He denies any chest pain, no palpitations. Reports he often gets lightheaded at times with standing   Cr 1.27 BUN 36 K 4.5 Hgb 11.7 Plt 159 TSH 3.4 EKG complete heart block   Allergies  Allergen Reactions  . Sulfa Antibiotics     Medications Scheduled Medications:     Infusions: . sodium chloride       PRN Medications:     Past Medical History  Diagnosis Date  . Essential hypertension, benign   . Type 2 diabetes mellitus (HCC)   . Dementia   . Parkinson's disease   . Insomnia   . Benign prostate hyperplasia     History reviewed. No pertinent past surgical history.  No family history on file.  Social History Mr. Federici reports that he has quit smoking. His smoking use included Cigarettes. He has a 2 pack-year smoking history. He has never used smokeless tobacco. Mr. Tullier reports that he does not drink alcohol.  Review of Systems CONSTITUTIONAL: No weight loss, fever, chills, weakness or fatigue.  HEENT: Eyes: chronic visual loss. No hearing loss, sneezing, congestion, runny nose or sore throat.  SKIN: No rash or itching.  CARDIOVASCULAR: No chest pain, chest pressure or chest discomfort. No palpitations or edema.  RESPIRATORY:  No shortness of breath, cough or sputum.  GASTROINTESTINAL: No anorexia, nausea, vomiting or diarrhea. No abdominal pain or blood.  GENITOURINARY: no polyuria, no dysuria NEUROLOGICAL: dizziness  MUSCULOSKELETAL: No muscle, back pain, joint pain or stiffness.  HEMATOLOGIC: No anemia, bleeding or bruising.  LYMPHATICS: No enlarged nodes. No history of splenectomy.  PSYCHIATRIC: No history of depression or anxiety.      Physical Examination Blood pressure 164/73, pulse 55, temperature 98 F (36.7 C), resp. rate 18, height  (1.676 m), weight 160 lb (72.576 kg), SpO2 92 %. No intake or output data in the 24 hours ending 11/30/15 1421  HEENT: sclera clear, throat clear  Cardiovascular: RRR, no m/r/g, no jvd  Respiratory: CTAB  GI: abdomen soft, NT, ND  MSK: no LE edema  Neuro: no focal deficits  Psych: appropriate affect   Lab Results  Basic Metabolic Panel:  Recent Labs Lab 11/30/15 1324  NA 140  K 4.5  CL 102  CO2 27  GLUCOSE 420*  BUN 36*  CREATININE 1.27*  CALCIUM 8.9    Liver Function Tests: No results for input(s): AST, ALT, ALKPHOS, BILITOT, PROT, ALBUMIN in the last 168 hours.  CBC:  Recent Labs Lab 11/30/15 1324  WBC 8.9  NEUTROABS 5.8  HGB 11.7*  HCT 36.1*  MCV 88.5  PLT 159    Cardiac Enzymes: No results for input(s): CKTOTAL, CKMB, CKMBINDEX, TROPONINI in the last 168 hours.  BNP: Invalid input(s): POCBNP   Recommendations  1. Syncope/.Bradycardia - patient with long history of bradycardia and  intermittent heart block. ER EKG is similar to our last clinic visit. Pacemaker has not been pursued due to his advanced dementia and very poor functional status. Though bradycardia may have played some role in this episode, he appears likely dehydrated based on labs and has had issues with orthostatic hypotension in the past likely related to his Parkinsons and DM2. By history episode is more consistent with orthostatic syncope.  - would  not pursue pacemaker at this time. I have ordered orthostatic vitals, though interpretation may be affected by IVFs.  - given prior orthostatic issues would stop HCTZ, start norvasc  daily for bp. - patient is asymptomatic with stable blood pressure, do not see indication for admission from cardiac standpoint  - from cardiac standpoint if orthostatic would continue IVFs and would be ok for discharge from ER. Please change HCTZ to norvasv  daily.    Dina Rich, M.D.

## 2015-11-30 NOTE — ED Notes (Signed)
Report given to [facility]

## 2015-11-30 NOTE — Progress Notes (Signed)
Pt arrived from [facility] via [place] at 2125. Pt was alert and oriented to self only upon arrival to the unit. Within 5 minutes after arrival, pt became nauseated, started to dry heave and face became purple in color. Monitor then showed a brief stent of asystole and then returned to a complete HB rates in the 30's as had previously been seen at [facility] and in route with [facility]. Pt was unresponsive for 2-3 minutes and diaphoretic before returning to previous state. MD was paged immediately. Also pt's BP was elevated immediately after episode with systolic BP in the 170's. Charge RN at bedside for this episode as well. MD on call advised to put the zoll pads on patient and this RN was informed that admitting MD was consulting to Cardiology. Charge RN will manage dips as ordered until pt is transferred to ICU bed. Report to be given to [facility]. See vitals below. Will continue to monitor pt until time of transfer. Today's Vitals   11/30/15 2144 11/30/15 2150 11/30/15 2200 11/30/15 2208  BP: 135/54 120/50 143/69 143/48  Pulse: 55  30 28  Temp: 97.6 F (36.4 C)     Resp: Height:  (1.702 m)     Weight: 75.1 kg (165 lb 9.1 oz)     SpO2: 47%  96% 95%

## 2015-11-30 NOTE — Progress Notes (Signed)
CRITICAL CARE NOTE >30 minutes Subjective: was called to the bedside. Patient confused and feeling lightheaded. He has dementia so cannot take good history. Blood pressure was stable. However patient had marked bradycardia. EKG showed complete heart block.   OBJECTIVE Vitals: BP 143/48 mmHg  Pulse 28  Temp(Src) 98 F (36.7 C)  Resp 23  Ht _0  (1.702 m)  Wt 75.1 kg (165 lb 9.1 oz)  BMI 25.93 kg/m2  SpO2 95% General: not in distress Lungs: clear to auscultation bilaterally Heart: bradycardiac, no murmurs Abdomen: non tender, non distended Neuro: confused, AAO x zero  Extremities: no leg edema JVP: cannon a waves, not elevated  Labs;  Recent Results (from the past 2160 hour(s))  Hemoglobin A1c     Status: Abnormal   Collection Time: 09/21/15 12:00 AM  Result Value Ref Range   Hgb A1c MFr Bld 8.3 (A) 4.0 - 6.0 %  TSH     Status: None   Collection Time: 10/18/15  7:29 AM  Result Value Ref Range   TSH 3.438 0.350 - 4.500 uIU/mL  CBG monitoring, ED     Status: Abnormal   Collection Time: 11/30/15  1:00 PM  Result Value Ref Range   Glucose-Capillary 356 (H) 65 - 99 mg/dL  Basic metabolic panel     Status: Abnormal   Collection Time: 11/30/15  1:24 PM  Result Value Ref Range   Sodium 140 135 - 145 mmol/L   Potassium 4.5 3.5 - 5.1 mmol/L   Chloride 102 101 - 111 mmol/L   CO2 27 22 - 32 mmol/L   Glucose, Bld 420 (H) 65 - 99 mg/dL   BUN 36 (H) 6 - 20 mg/dL   Creatinine, Ser 1.27 (H) 0.61 - 1.24 mg/dL   Calcium 8.9 8.9 - 10.3 mg/dL   GFR calc non Af Amer 52 (L) >60 mL/min   GFR calc Af Amer 60 (L) >60 mL/min    Comment: (NOTE) The eGFR has been calculated using the CKD EPI equation. This calculation has not been validated in all clinical situations. eGFR's persistently <60 mL/min signify possible Chronic Kidney Disease.    Anion gap 11 5 - 15  CBC with Differential/Platelet     Status: Abnormal   Collection Time: 11/30/15  1:24 PM  Result Value Ref Range   WBC 8.9 4.0 -  10.5 K/uL   RBC 4.08 (L) 4.22 - 5.81 MIL/uL   Hemoglobin 11.7 (L) 13.0 - 17.0 g/dL   HCT 36.1 (L) 39.0 - 52.0 %   MCV 88.5 78.0 - 100.0 fL   MCH 28.7 26.0 - 34.0 pg   MCHC 32.4 30.0 - 36.0 g/dL   RDW 13.4 11.5 - 15.5 %   Platelets 159 150 - 400 K/uL   Neutrophils Relative % 65 %   Neutro Abs 5.8 1.7 - 7.7 K/uL   Lymphocytes Relative 23 %   Lymphs Abs 2.0 0.7 - 4.0 K/uL   Monocytes Relative 10 %   Monocytes Absolute 0.9 0.1 - 1.0 K/uL   Eosinophils Relative 2 %   Eosinophils Absolute 0.2 0.0 - 0.7 K/uL   Basophils Relative 0 %   Basophils Absolute 0.0 0.0 - 0.1 K/uL  Troponin I (q 6hr x 3)     Status: Abnormal   Collection Time: 11/30/15  1:24 PM  Result Value Ref Range   Troponin I 0.04 (H) <0.031 ng/mL    Comment:        PERSISTENTLY INCREASED TROPONIN VALUES IN THE RANGE OF  0.04-0.49 ng/mL CAN BE SEEN IN:       -UNSTABLE ANGINA       -CONGESTIVE HEART FAILURE       -MYOCARDITIS       -CHEST TRAUMA       -ARRYHTHMIAS       -LATE PRESENTING MYOCARDIAL INFARCTION       -COPD   CLINICAL FOLLOW-UP RECOMMENDED.   Glucose, capillary     Status: Abnormal   Collection Time: 11/30/15  9:35 PM  Result Value Ref Range   Glucose-Capillary 420 (H) 65 - 99 mg/dL   Comment 1 Capillary Specimen    EKG reviewed personally; showed normal sinus rhythm with complete third degree heart block  Medical decision making Discussed care with the hospitalist face to face  Assessment Complete heart block  Plan:  1. Start on isoproterenol 2. Transfer to ICU 3. Stop celexa as can affect HR 4. If bp drops or patient did not improve his HR >40, will consider calling interventional staff for temporary pacemaker placement 5. Do not use atropine  Spent 32 minutes in care/reviewing the patient's chart.

## 2015-11-30 NOTE — ED Notes (Signed)
Patient placed on pacer pads.

## 2015-11-30 NOTE — ED Notes (Signed)
Per EMS- pt a resident [facility], had syncopal episode in dining room at 1207. Pt alert and oriented to person/place and circumstance as per baseline. Denies pain/dizziness. nad noted.

## 2015-12-01 ENCOUNTER — Inpatient Hospital Stay (HOSPITAL_COMMUNITY): Payer: Medicare Other

## 2015-12-01 ENCOUNTER — Encounter (HOSPITAL_COMMUNITY): Payer: Self-pay | Admitting: Cardiovascular Disease

## 2015-12-01 ENCOUNTER — Ambulatory Visit (HOSPITAL_COMMUNITY): Admit: 2015-12-01 | Payer: Self-pay | Admitting: Internal Medicine

## 2015-12-01 ENCOUNTER — Encounter (HOSPITAL_COMMUNITY)
Admission: EM | Disposition: A | Discharge status: Home Health | Payer: Self-pay | Source: Home / Self Care | Attending: Internal Medicine

## 2015-12-01 DIAGNOSIS — I10 Essential (primary) hypertension: Secondary | ICD-10-CM

## 2015-12-01 DIAGNOSIS — N400 Enlarged prostate without lower urinary tract symptoms: Secondary | ICD-10-CM

## 2015-12-01 DIAGNOSIS — I459 Conduction disorder, unspecified: Secondary | ICD-10-CM

## 2015-12-01 DIAGNOSIS — N179 Acute kidney failure, unspecified: Secondary | ICD-10-CM

## 2015-12-01 DIAGNOSIS — I442 Atrioventricular block, complete: Secondary | ICD-10-CM

## 2015-12-01 DIAGNOSIS — E119 Type 2 diabetes mellitus without complications: Secondary | ICD-10-CM

## 2015-12-01 DIAGNOSIS — Z950 Presence of cardiac pacemaker: Secondary | ICD-10-CM

## 2015-12-01 HISTORY — DX: Presence of cardiac pacemaker: Z95.0

## 2015-12-01 HISTORY — PX: EP IMPLANTABLE DEVICE: SHX172B

## 2015-12-01 LAB — CBC
HCT: 39 % (ref 39.0–52.0)
HCT: 36.9 % — ABNORMAL LOW (ref 39.0–52.0)
Hemoglobin: 12.1 g/dL — ABNORMAL LOW (ref 13.0–17.0)
Hemoglobin: 11.8 g/dL — ABNORMAL LOW (ref 13.0–17.0)
MCH: 27.6 pg (ref 26.0–34.0)
MCH: 27.8 pg (ref 26.0–34.0)
MCHC: 31 g/dL (ref 30.0–36.0)
MCHC: 32 g/dL (ref 30.0–36.0)
MCV: 89 fL (ref 78.0–100.0)
MCV: 86.8 fL (ref 78.0–100.0)
PLATELETS: 155 10*3/uL (ref 150–400)
PLATELETS: 137 10*3/uL — AB (ref 150–400)
RBC: 4.38 MIL/uL (ref 4.22–5.81)
RBC: 4.25 MIL/uL (ref 4.22–5.81)
RDW: 13.6 % (ref 11.5–15.5)
RDW: 13.6 % (ref 11.5–15.5)
WBC: 16.6 10*3/uL — AB (ref 4.0–10.5)
WBC: 13.5 10*3/uL — ABNORMAL HIGH (ref 4.0–10.5)

## 2015-12-01 LAB — GLUCOSE, CAPILLARY
GLUCOSE-CAPILLARY: 85 mg/dL (ref 65–99)
GLUCOSE-CAPILLARY: 97 mg/dL (ref 65–99)
GLUCOSE-CAPILLARY: 119 mg/dL — AB (ref 65–99)
Glucose-Capillary: 196 mg/dL — ABNORMAL HIGH (ref 65–99)
Glucose-Capillary: 379 mg/dL — ABNORMAL HIGH (ref 65–99)
Glucose-Capillary: 452 mg/dL — ABNORMAL HIGH (ref 65–99)
Glucose-Capillary: 98 mg/dL (ref 65–99)

## 2015-12-01 LAB — URINALYSIS, ROUTINE W REFLEX MICROSCOPIC
Glucose, UA: 250 mg/dL — AB
Hgb urine dipstick: NEGATIVE
KETONES UR: 15 mg/dL — AB
Leukocytes, UA: NEGATIVE
NITRITE: NEGATIVE
Protein, ur: 100 mg/dL — AB
Specific Gravity, Urine: 1.026 (ref 1.005–1.030)
pH: 5 (ref 5.0–8.0)

## 2015-12-01 LAB — URINE MICROSCOPIC-ADD ON: RBC / HPF: NONE SEEN RBC/hpf (ref 0–5)

## 2015-12-01 LAB — BASIC METABOLIC PANEL
Anion gap: 13 (ref 5–15)
BUN: 43 mg/dL — AB (ref 6–20)
CHLORIDE: 106 mmol/L (ref 101–111)
CO2: 21 mmol/L — ABNORMAL LOW (ref 22–32)
CREATININE: 2.07 mg/dL — AB (ref 0.61–1.24)
Calcium: 8.5 mg/dL — ABNORMAL LOW (ref 8.9–10.3)
GFR calc Af Amer: 33 mL/min — ABNORMAL LOW (ref >60)
GFR, EST NON AFRICAN AMERICAN: 29 mL/min — AB (ref >60)
Glucose, Bld: 349 mg/dL — ABNORMAL HIGH (ref 65–99)
Potassium: 4.6 mmol/L (ref 3.5–5.1)
Sodium: 140 mmol/L (ref 135–145)

## 2015-12-01 LAB — COMPREHENSIVE METABOLIC PANEL
ALBUMIN: 3.2 g/dL — AB (ref 3.5–5.0)
ALK PHOS: 84 U/L (ref 38–126)
ALT: 85 U/L — AB (ref 17–63)
AST: 77 U/L — AB (ref 15–41)
Anion gap: 25 — ABNORMAL HIGH (ref 5–15)
BUN: 38 mg/dL — ABNORMAL HIGH (ref 6–20)
CALCIUM: 8.7 mg/dL — AB (ref 8.9–10.3)
CO2: 16 mmol/L — AB (ref 22–32)
CREATININE: 2.07 mg/dL — AB (ref 0.61–1.24)
Chloride: 101 mmol/L (ref 101–111)
GFR calc Af Amer: 33 mL/min — ABNORMAL LOW (ref >60)
GFR calc non Af Amer: 29 mL/min — ABNORMAL LOW (ref >60)
GLUCOSE: 492 mg/dL — AB (ref 65–99)
Potassium: 4.1 mmol/L (ref 3.5–5.1)
SODIUM: 142 mmol/L (ref 135–145)
Total Bilirubin: 1.4 mg/dL — ABNORMAL HIGH (ref 0.3–1.2)
Total Protein: 6.4 g/dL — ABNORMAL LOW (ref 6.5–8.1)

## 2015-12-01 LAB — CREATININE, SERUM
Creatinine, Ser: 2 mg/dL — ABNORMAL HIGH (ref 0.61–1.24)
GFR calc non Af Amer: 30 mL/min — ABNORMAL LOW (ref >60)
GFR, EST AFRICAN AMERICAN: 35 mL/min — AB (ref >60)

## 2015-12-01 LAB — TSH: TSH: 2.624 u[IU]/mL (ref 0.350–4.500)

## 2015-12-01 LAB — MRSA PCR SCREENING: MRSA by PCR: POSITIVE — AB

## 2015-12-01 LAB — TROPONIN I: Troponin I: 0.89 ng/mL (ref <0.031)

## 2015-12-01 SURGERY — PACEMAKER IMPLANT

## 2015-12-01 MED ORDER — SODIUM CHLORIDE 0.9 % IV SOLN
INTRAVENOUS | Status: AC
  Administered 2015-12-01: 01:00:00 via INTRAVENOUS

## 2015-12-01 MED ORDER — LIDOCAINE HCL (PF) 1 % IJ SOLN
INTRAMUSCULAR | Status: AC
  Filled 2015-12-01: qty 60

## 2015-12-01 MED ORDER — CHLORHEXIDINE GLUCONATE 4 % EX LIQD
60.0000 mL | Freq: Once | CUTANEOUS | Status: AC
  Administered 2015-12-01: 4 via TOPICAL

## 2015-12-01 MED ORDER — CHLORHEXIDINE GLUCONATE CLOTH 2 % EX PADS
6.0000 | MEDICATED_PAD | Freq: Every day | CUTANEOUS | Status: DC
  Administered 2015-12-01 – 2015-12-04 (×4): 6 via TOPICAL

## 2015-12-01 MED ORDER — ONDANSETRON HCL 4 MG/2ML IJ SOLN
INTRAMUSCULAR | Status: AC
  Administered 2015-12-01: 4 mg
  Filled 2015-12-01: qty 2

## 2015-12-01 MED ORDER — MIDAZOLAM HCL 5 MG/5ML IJ SOLN
INTRAMUSCULAR | Status: AC
  Filled 2015-12-01: qty 5

## 2015-12-01 MED ORDER — SODIUM CHLORIDE 0.9 % IV SOLN: 250.0000 mL | INTRAVENOUS | Status: DC | PRN

## 2015-12-01 MED ORDER — ACETAMINOPHEN 325 MG PO TABS: 325.0000 mg | ORAL_TABLET | ORAL | Status: DC | PRN

## 2015-12-01 MED ORDER — ONDANSETRON HCL 4 MG/2ML IJ SOLN: 4.0000 mg | Freq: Four times a day (QID) | INTRAMUSCULAR | Status: DC | PRN

## 2015-12-01 MED ORDER — CHLORHEXIDINE GLUCONATE 4 % EX LIQD
CUTANEOUS | Status: AC
  Filled 2015-12-01: qty 15

## 2015-12-01 MED ORDER — CETYLPYRIDINIUM CHLORIDE 0.05 % MT LIQD
7.0000 mL | Freq: Two times a day (BID) | OROMUCOSAL | Status: DC
  Administered 2015-12-01 – 2015-12-04 (×7): 7 mL via OROMUCOSAL

## 2015-12-01 MED ORDER — FENTANYL CITRATE (PF) 100 MCG/2ML IJ SOLN
INTRAMUSCULAR | Status: DC | PRN
  Administered 2015-11-30: 12.5 ug via INTRAVENOUS

## 2015-12-01 MED ORDER — LABETALOL HCL 5 MG/ML IV SOLN
INTRAVENOUS | Status: AC
  Filled 2015-12-01: qty 4

## 2015-12-01 MED ORDER — SODIUM CHLORIDE 0.9 % IV SOLN: INTRAVENOUS | Status: AC

## 2015-12-01 MED ORDER — INSULIN ASPART 100 UNIT/ML ~~LOC~~ SOLN
7.0000 [IU] | Freq: Once | SUBCUTANEOUS | Status: AC
  Administered 2015-12-01: 7 [IU] via SUBCUTANEOUS

## 2015-12-01 MED ORDER — CEFAZOLIN SODIUM 1-5 GM-% IV SOLN
1.0000 g | Freq: Two times a day (BID) | INTRAVENOUS | Status: DC
  Filled 2015-12-01: qty 50

## 2015-12-01 MED ORDER — SODIUM CHLORIDE 0.9% FLUSH: 3.0000 mL | Freq: Two times a day (BID) | INTRAVENOUS | Status: DC

## 2015-12-01 MED ORDER — SODIUM CHLORIDE 0.9 % IV SOLN: INTRAVENOUS | Status: DC

## 2015-12-01 MED ORDER — SODIUM CHLORIDE 0.9 % IR SOLN
80.0000 mg | Status: AC
  Administered 2015-12-01: 80 mg

## 2015-12-01 MED ORDER — SODIUM CHLORIDE 0.9% FLUSH: 3.0000 mL | INTRAVENOUS | Status: DC | PRN

## 2015-12-01 MED ORDER — HEPARIN (PORCINE) IN NACL 2-0.9 UNIT/ML-% IJ SOLN
INTRAMUSCULAR | Status: AC
  Filled 2015-12-01: qty 500

## 2015-12-01 MED ORDER — SODIUM CHLORIDE 0.9 % IV SOLN: 250.0000 mL | INTRAVENOUS | Status: DC

## 2015-12-01 MED ORDER — LIDOCAINE HCL (PF) 1 % IJ SOLN
INTRAMUSCULAR | Status: DC | PRN
  Administered 2015-12-01: 31 mL

## 2015-12-01 MED ORDER — MUPIROCIN 2 % EX OINT
1.0000 "application " | TOPICAL_OINTMENT | Freq: Two times a day (BID) | CUTANEOUS | Status: DC
  Administered 2015-12-01 – 2015-12-03 (×7): 1 via NASAL
  Filled 2015-12-01 (×3): qty 22

## 2015-12-01 MED ORDER — HEPARIN (PORCINE) IN NACL 2-0.9 UNIT/ML-% IJ SOLN
INTRAMUSCULAR | Status: DC | PRN
  Administered 2015-12-01: 15:00:00

## 2015-12-01 MED ORDER — CEFAZOLIN SODIUM-DEXTROSE 2-3 GM-% IV SOLR
2.0000 g | INTRAVENOUS | Status: AC
  Administered 2015-12-01: 2 g via INTRAVENOUS
  Filled 2015-12-01: qty 50

## 2015-12-01 MED ORDER — FENTANYL CITRATE (PF) 100 MCG/2ML IJ SOLN
INTRAMUSCULAR | Status: AC
  Filled 2015-12-01: qty 2

## 2015-12-01 MED ORDER — SODIUM CHLORIDE 0.9 % IR SOLN
Status: AC
  Filled 2015-12-01: qty 2

## 2015-12-01 MED ORDER — LABETALOL HCL 5 MG/ML IV SOLN
INTRAVENOUS | Status: DC | PRN
  Administered 2015-12-01: 20 mg via INTRAVENOUS

## 2015-12-01 MED ORDER — CEFAZOLIN SODIUM-DEXTROSE 2-3 GM-% IV SOLR
INTRAVENOUS | Status: AC
  Filled 2015-12-01: qty 50

## 2015-12-01 MED ORDER — ACETAMINOPHEN 325 MG PO TABS: 650.0000 mg | ORAL_TABLET | ORAL | Status: DC | PRN

## 2015-12-01 MED FILL — Heparin Sodium (Porcine) 2 Unit/ML in Sodium Chloride 0.9%: INTRAMUSCULAR | Qty: 500 | Status: AC

## 2015-12-01 MED FILL — Lidocaine HCl Local Preservative Free (PF) Inj 1%: INTRAMUSCULAR | Qty: 30 | Status: AC

## 2015-12-01 SURGICAL SUPPLY — 8 items
CABLE SURGICAL S-101-97-12 (CABLE) ×2 IMPLANT
HEMOSTAT SURGICEL 2X4 FIBR (HEMOSTASIS) ×2 IMPLANT
LEAD TENDRIL SDX 1688TC-52CM (Lead) ×2 IMPLANT
LEAD TENDRIL SDX 1688TC-58CM (Lead) ×2 IMPLANT
PAD DEFIB LIFELINK (PAD) ×2 IMPLANT
PPM ASSURITY DR PM2240 (Pacemaker) ×2 IMPLANT
SHEATH CLASSIC 7F (SHEATH) ×4 IMPLANT
TRAY PACEMAKER INSERTION (PACKS) ×2 IMPLANT

## 2015-12-01 NOTE — Care Management Note (Signed)
Case Management Note  Patient Details  Name: BARDIA WANGERIN MRN: [id number] Date of Birth: 04-Jan-1933  Subjective/Objective:         Adm w bradycardia           Action/Plan: from nsg facility, sw ref made   Expected Discharge Date:                  Expected Discharge Plan:  Skilled Nursing Facility  In-House Referral:  Clinical Social Work  Discharge planning Services     Post Acute Care Choice:    Choice offered to:     DME Arranged:    DME Agency:     HH Arranged:    HH Agency:     Status of Service:     Medicare Important Message Given:    Date Medicare IM Given:    Medicare IM give by:    Date Additional Medicare IM Given:    Additional Medicare Important Message give by:     If discussed at Long Length of Stay Meetings, dates discussed:    Additional Comments: ur review done  Hanley Hays, RN 12/01/2015, 7:46 AM

## 2015-12-01 NOTE — Progress Notes (Signed)
Pt. Blood sugar over 400.  MD made aware.  Will continue to monitor.

## 2015-12-01 NOTE — Progress Notes (Signed)
SLP Cancellation Note  Patient Details Name: Cody [name] MRN: [id number] DOB: Jul 26, 1933   Cancelled evaluation:        Pt NPO for procedure. Will likely f/u next date.   Lynita Lombard 12/01/2015, 1:14 PM

## 2015-12-01 NOTE — Progress Notes (Signed)
Ref: No primary care provider on file.   Subjective:  Feeling better with temporary pacemaker. Vital signs stable.  Objective:  Vital Signs in the last 24 hours: Temp:  [97.6 F (36.4 C)-98.6 F (37 C)] 98.6 F (37 C) (02/17 2000) Pulse Rate:  [0-288] 62 (02/17 2000) Cardiac Rhythm:  [-] A-V Sequential paced (02/17 2000) Resp:  [0-49] 19 (02/17 2000) BP: (110-189)/(48-110) 154/72 mmHg (02/17 2000) SpO2:  [0 %-99 %] 95 % (02/17 2000) Weight:  [75.1 kg (165 lb 9.1 oz)-76 kg (167 lb 8.8 oz)] 76 kg (167 lb 8.8 oz) (02/17 0400)  Physical Exam: BP Readings from Last 1 Encounters:  12/01/15 154/72    Wt Readings from Last 1 Encounters:  12/01/15 76 kg (167 lb 8.8 oz)    Weight change:   HEENT: [place]/AT, Eyes- Blind in left eye, Conjunctiva-Pink, Sclera-Non-icteric Neck: No JVD, No bruit, Trachea midline. Lungs:  Clear, Bilateral. Cardiac:  Regular paced rhythm, normal S1 and S2, no S3.  Abdomen:  Soft, non-tender. Extremities:  No edema present. No cyanosis. No clubbing. CNS: AxOx2, Cranial nerves grossly intact, moves all 4 extremities.  Skin: Warm and dry.   Intake/Output from previous day: 02/16 0701 - 02/17 0700 In: 1030.3 [P.O.:240; I.V.:790.3] Out: -     Lab Results: BMET    Component Value Date/Time   NA 140 12/01/2015 0549   NA 142 11/30/2015 2325   NA 140 11/30/2015 1324   K 4.6 12/01/2015 0549   K 4.1 11/30/2015 2325   K 4.5 11/30/2015 1324   CL 106 12/01/2015 0549   CL 101 11/30/2015 2325   CL 102 11/30/2015 1324   CO2 21* 12/01/2015 0549   CO2 16* 11/30/2015 2325   CO2 27 11/30/2015 1324   GLUCOSE 349* 12/01/2015 0549   GLUCOSE 492* 11/30/2015 2325   GLUCOSE 420* 11/30/2015 1324   BUN 43* 12/01/2015 0549   BUN 38* 11/30/2015 2325   BUN 36* 11/30/2015 1324   CREATININE 2.07* 12/01/2015 0549   CREATININE 2.00* 12/01/2015 0205   CREATININE 2.07* 11/30/2015 2325   CALCIUM 8.5* 12/01/2015 0549   CALCIUM 8.7* 11/30/2015 2325   CALCIUM 8.9 11/30/2015  1324   GFRNONAA 29* 12/01/2015 0549   GFRNONAA 30* 12/01/2015 0205   GFRNONAA 29* 11/30/2015 2325   GFRAA 33* 12/01/2015 0549   GFRAA 35* 12/01/2015 0205   GFRAA 33* 11/30/2015 2325   CBC    Component Value Date/Time   WBC 13.5* 12/01/2015 0549   RBC 4.25 12/01/2015 0549   HGB 11.8* 12/01/2015 0549   HCT 36.9* 12/01/2015 0549   PLT 137* 12/01/2015 0549   MCV 86.8 12/01/2015 0549   MCH 27.8 12/01/2015 0549   MCHC 32.0 12/01/2015 0549   RDW 13.6 12/01/2015 0549   LYMPHSABS 2.0 11/30/2015 1324   MONOABS 0.9 11/30/2015 1324   EOSABS 0.2 11/30/2015 1324   BASOSABS 0.0 11/30/2015 1324   HEPATIC Function Panel  Recent Labs  12/05/14 1905 12/06/14 0626 11/30/15 2325  PROT 7.1 6.2 6.4*   HEMOGLOBIN A1C No components found for: HGA1C,  MPG CARDIAC ENZYMES Lab Results  Component Value Date   TROPONINI 0.89* 12/01/2015   TROPONINI 0.04* 11/30/2015   TROPONINI <0.03 07/16/2015   BNP No results for input(s): PROBNP in the last 8760 hours. TSH  Recent Labs  10/18/15 0729 11/30/15 2325  TSH 3.438 2.624   CHOLESTEROL No results for input(s): CHOL in the last 8760 hours.  Scheduled Meds: . antiseptic oral rinse  7 mL Mouth  Rinse BID  . aspirin  81 mg Oral Daily  . atropine  0.5 mg Intravenous Once  . [START ON 12/02/2015]  ceFAZolin (ANCEF) IV  1 g Intravenous Q12H  . Chlorhexidine Gluconate Cloth  6 each Topical Q0600  . haloperidol  1 mg Oral QHS  . heparin  5,000 Units Subcutaneous 3 times per day  . hydrochlorothiazide  25 mg Oral Daily  . insulin aspart  0-20 Units Subcutaneous TID WC  . insulin aspart  0-5 Units Subcutaneous QHS  . insulin detemir  20 Units Subcutaneous QHS  . metoCLOPramide (REGLAN) injection  10 mg Intravenous 3 times per day  . mupirocin ointment  1 application Nasal BID  . sodium chloride flush  3 mL Intravenous Q12H  . sodium chloride flush  3 mL Intravenous Q12H  . [START ON 12/02/2015] sodium chloride flush  3 mL Intravenous Q12H  .  tamsulosin  0.4 mg Oral Daily   Continuous Infusions: . sodium chloride 75 mL/hr at 12/01/15 2100  . isoproterenol (ISUPREL) infusion Stopped (12/01/15 0016)   PRN Meds:.sodium chloride, acetaminophen, midazolam, ondansetron (ZOFRAN) IV, sodium chloride flush, temazepam  Assessment/Plan: Complete Heart block Essential hypertension Type II, DM Dementia Parkinson's disease  Awaiting permanent pacemaker placement by Dr. Clide Cliff.   LOS: 1 day    Orpah Cobb  MD  12/01/2015, 9:06 PM

## 2015-12-01 NOTE — Progress Notes (Signed)
Orthopedic Tech Progress Note Patient Details:  Cody [name] 1933-09-25 [id number]  Ortho Devices Type of Ortho Device: Knee Immobilizer Ortho Device/Splint Interventions: Marisa Sprinkles 12/01/2015, 12:27 PM

## 2015-12-01 NOTE — Progress Notes (Signed)
Inpatient Diabetes Program Recommendations  AACE/ADA: New Consensus Statement on Inpatient Glycemic Control (2015)  Target Ranges:  Prepandial:   less than 140 mg/dL      Peak postprandial:   less than 180 mg/dL (1-2 hours)      Critically ill patients:  140 - 180 mg/dL   Review of Glycemic Control  Results for Cody [name], Cody [name] (MRN [id number]) as of 12/01/2015 08:13  Ref. Range 12/08/2014 11:46 11/30/2015 13:00 11/30/2015 21:35 12/01/2015 00:20 12/01/2015 03:12  Glucose-Capillary Latest Ref Range: 65-99 mg/dL 409 (H) 811 (H) 914 (H) 452 (H) 379 (H)    Diabetes history:  Type 2 Outpatient Diabetes medications: Levemir 40 units qhs, Tradjenta /day, Humalog 15-21 units tid Current orders for Inpatient glycemic control: Levemir 20 units qhs, Novolog 0-20 units tid, Novolog 0-5 units qhs  Inpatient Diabetes Program Recommendations:  Patient has poor renal function and is NPO- consider changing Novolog correction to moderate correction 0-15 units q6h.  When he starts to eat, return to tid and hs insulin administration times.   Susette Racer, RN, BA, MHA, CDE Diabetes Coordinator Inpatient Diabetes Program  [phone] (Team Pager) [phone] [facility] Office) 12/01/2015 8:17 AM

## 2015-12-01 NOTE — Discharge Instructions (Signed)
° ° °  Supplemental Discharge Instructions for  Pacemaker/Defibrillator Patients  Activity No heavy lifting or vigorous activity with your left/right arm for 6 to 8 weeks.  Do not raise your left/right arm above your head for one week.  Gradually raise your affected arm as drawn below.           __    12/05/15                   12/06/15                    12/07/15                  12/08/15   WOUND CARE - Keep the wound area clean and dry.  Do not get this area wet for one week. No showers for one week; you may shower on  12/08/15   . - The tape/steri-strips on your wound will fall off; do not pull them off.  No bandage is needed on the site.  DO  NOT apply any creams, oils, or ointments to the wound area. - If you notice any drainage or discharge from the wound, any swelling or bruising at the site, or you develop a fever > 101? F after you are discharged home, call the office at once.  Special Instructions - You are still able to use cellular telephones; use the ear opposite the side where you have your pacemaker/defibrillator.  Avoid carrying your cellular phone near your device. - When traveling through airports, show security personnel your identification card to avoid being screened in the metal detectors.  Ask the security personnel to use the hand wand. - Avoid arc welding equipment, MRI testing (magnetic resonance imaging), TENS units (transcutaneous nerve stimulators).  Call the office for questions about other devices. - Avoid electrical appliances that are in poor condition or are not properly grounded. - Microwave ovens are safe to be near or to operate.

## 2015-12-01 NOTE — Consult Note (Signed)
ELECTROPHYSIOLOGY CONSULT NOTE    Patient ID: Cody [name] MRN: [id number], DOB/AGE: October 25, 1932 82 y.o.  Admit date: 11/30/2015 Date of Consult: 12/01/2015  Primary Physician: No primary care provider on file. Primary Cardiologist: Dr. Wyline Mood  Reason for Consultation: heart block  HPI: Cody [name] is a 82 y.o. male with PMHx of advanced dementia, Parkinson's disease, legally blind with poor functional capacity requires assistance with daily ADL's, lives in a SNF.  He has known history of bradycardia with heart block in the past, in discussion with Dr. Wyline Mood, stable escape rhythm noted historically in the 40's with good BP, and given his significant comorbidities and functional decline had avoided a PPM implant.  He was brought to [facility] yesterday after a syncopal event at the SNF, possibly a fall.  He was evaluated in the ED had CHB with good BP and without symptom at that time, with suspect dehydration was contributing to his event, though afterwards suffered an event of asystole associated with LOC, and he was transferred to [facility] for further care and management.  Once here, he was observed to have ongoing CHB with rates into the 20's and more asystolic events and temporary pacing wire/pacing was done by Dr. Algie Coffer overnight.  His BP has been stable   His presenting labs    Past Medical History  Diagnosis Date  . Essential hypertension, benign   . Type 2 diabetes mellitus (HCC)   . Dementia   . Parkinson's disease   . Insomnia   . Benign prostate hyperplasia      Surgical History:  Past Surgical History  Procedure Laterality Date  . Cardiac catheterization N/A 11/30/2015    Procedure: Temporary Pacemaker;  Surgeon: Orpah Cobb, MD;  Location: [facility] INVASIVE CV LAB;  Service: Cardiovascular;  Laterality: N/A;     Prescriptions prior to admission  Medication Sig Dispense Refill Last Dose  . aspirin EC 81 MG tablet Take 81 mg by mouth daily.     11/30/2015 at Unknown  time  . Camphor-Eucalyptus-Menthol (MEDICATED CHEST RUB EX) Apply 1 application topically 2 (two) times daily.   11/30/2015 at Unknown time  . escitalopram (LEXAPRO) 10 MG tablet Take 10 mg by mouth daily.   11/30/2015 at Unknown time  . fluticasone (FLONASE) 50 MCG/ACT nasal spray Place into both nostrils daily.   11/30/2015 at Unknown time  . haloperidol (HALDOL) 1 MG tablet Take 1 mg by mouth at bedtime.     11/29/2015 at Unknown time  . hydrochlorothiazide (HYDRODIURIL) 25 MG tablet Take 1 tablet (25 mg total) by mouth daily. 30 tablet 2 11/30/2015 at Unknown time  . insulin detemir (LEVEMIR) 100 UNIT/ML injection Inject 0.4 mLs (40 Units total) into the skin at bedtime. 10 mL 3 11/29/2015 at Unknown time  . insulin lispro (HUMALOG) 100 UNIT/ML injection Inject 15-21 Units into the skin 3 (three) times daily before meals.   11/30/2015 at 1200  . linagliptin (TRADJENTA) 5 MG TABS tablet Take 5 mg by mouth daily.   11/30/2015 at Unknown time  . loratadine (CLARITIN) 10 MG tablet Take 10 mg by mouth daily.   11/30/2015 at Unknown time  . Melatonin 5 MG CAPS Take 10 mg by mouth at bedtime.   11/29/2015 at Unknown time  . Tamsulosin HCl (FLOMAX) 0.4 MG CAPS Take 0.4 mg by mouth daily.     11/30/2015 at Unknown time  . temazepam (RESTORIL) 15 MG capsule Take 15 mg by mouth at bedtime as needed for sleep.  11/29/2015 at Unknown time  . Thiamine HCl (VITAMIN B-1) 100 MG tablet Take 100 mg by mouth daily.     11/30/2015 at Unknown time  . clotrimazole-betamethasone (LOTRISONE) cream Apply 1 application topically 2 (two) times daily as needed. Itching     Taking  . hydrOXYzine (ATARAX) 25 MG tablet Take 25 mg by mouth every 6 (six) hours as needed. itching    Taking  . levofloxacin (LEVAQUIN) 750 MG tablet Take 1 tablet (750 mg total) by mouth daily. (Patient not taking: Reported on 11/30/2015) 2 tablet 0 Completed Course at Unknown time  . oseltamivir (TAMIFLU) 75 MG capsule Take 1 capsule (75 mg total) by mouth 2  (two) times daily. (Patient not taking: Reported on 11/30/2015) 4 capsule 0 Completed Course at Unknown time    Inpatient Medications:  . antiseptic oral rinse  7 mL Mouth Rinse BID  . aspirin  81 mg Oral Daily  . atropine  0.5 mg Intravenous Once  . Chlorhexidine Gluconate Cloth  6 each Topical Q0600  . haloperidol  1 mg Oral QHS  . heparin  5,000 Units Subcutaneous 3 times per day  . hydrochlorothiazide  25 mg Oral Daily  . insulin aspart  0-20 Units Subcutaneous TID WC  . insulin aspart  0-5 Units Subcutaneous QHS  . insulin detemir  20 Units Subcutaneous QHS  . metoCLOPramide (REGLAN) injection  10 mg Intravenous 3 times per day  . mupirocin ointment  1 application Nasal BID  . sodium chloride flush  3 mL Intravenous Q12H  . sodium chloride flush  3 mL Intravenous Q12H  . [START ON 12/02/2015] sodium chloride flush  3 mL Intravenous Q12H  . tamsulosin  0.4 mg Oral Daily    Allergies:  Allergies  Allergen Reactions  . Sulfa Antibiotics     Social History   Social History  . Marital Status: Single    Spouse Name: N/A  . Number of Children: N/A  . Years of Education: N/A   Occupational History  . Not on file.   Social History Main Topics  . Smoking status: Former Smoker -- 0.50 packs/day for 4 years    Types: Cigarettes  . Smokeless tobacco: Never Used  . Alcohol Use: No  . Drug Use: No  . Sexual Activity: Not Currently   Other Topics Concern  . Not on file   Social History Narrative     Family History  Problem Relation Age of Onset  . Family history unknown: Yes    Pt unable to give family hx   Review of Systems: General: No chills, fever, night sweats or weight changes  Cardiovascular:  No chest pain, dyspnea on exertion, edema, orthopnea, palpitations, paroxysmal nocturnal dyspnea Dermatological: No rash, lesions or masses Respiratory: No cough, dyspnea Urologic: No hematuria, dysuria Abdominal: No nausea, vomiting, diarrhea, bright red blood per  rectum, melena, or hematemesis Neurologic: No visual changes, weakness, changes in mental status All other systems reviewed and are otherwise negative except as noted above.  Physical Exam: Filed Vitals:   12/01/15 0500 12/01/15 0600 12/01/15 0700 12/01/15 0800  BP: 110/96 150/110 121/71 114/62  Pulse:      Temp:    98.5 F (36.9 C)  TempSrc:    Oral  Resp: Height:      Weight:      SpO2: 98% 98% 95% 96%    GEN- The patient is well appearing, alert and oriented x 2 today.   HEENT: normocephalic,  Eye deformity R -blind ; sclera clear, conjunctiva pink; hearing intact; oropharynx clear; neck supple,    Lymph- no cervical lymphadenopathy Lungs- Clear to ausculation laterally, normal work of breathing.  No wheezes, rales, rhonchi Heart- slow but Regular rate and rhythm, 2/6  Murmurs    GI- soft, non-tender, non-distended, bowel sounds present Extremities- no clubbing, cyanosis, or edema; DP/PT/radial pulses 2+ bilaterally MS- no significant deformity or atrophy Skin- warm and dry, no rash or lesion Psych- euthymic mood, full affect Neuro- no gross deficits observed  Labs:   Lab Results  Component Value Date   WBC 13.5* 12/01/2015   HGB 11.8* 12/01/2015   HCT 36.9* 12/01/2015   MCV 86.8 12/01/2015   PLT 137* 12/01/2015    Recent Labs Lab 11/30/15 2325  12/01/15 0549  NA 142  --  140  K 4.1  --  4.6  CL 101  --  106  CO2 16*  --  21*  BUN 38*  --  43*  CREATININE 2.07*  < > 2.07*  CALCIUM 8.7*  --  8.5*  PROT 6.4*  --   --   BILITOT 1.4*  --   --   ALKPHOS 84  --   --   ALT 85*  --   --   AST 77*  --   --   GLUCOSE 492*  --  349*  < > = values in this interval not displayed.    Radiology/Studies:  Dg Chest Port 1 View 11/30/2015  CLINICAL DATA:  Syncopal episode EXAM: PORTABLE CHEST 1 VIEW COMPARISON:  07/16/2015 FINDINGS: Cardiac shadow is stable. The lungs are well aerated bilaterally. Increased density along the lateral chest wall on the right  is noted consistent with effusion. This may be somewhat loculated given its appearance. No focal infiltrate is seen. IMPRESSION: Apparent loculated effusion laterally on the right. Electronically Signed   By: Alcide Clever M.D.   On: 11/30/2015 20:07    EKG AV block TELEMETRY:  Complete heart block with no underlying rhyhtm Temp pacer    Assessment and Plan:   Complete heart block  Dementia  Renal failure Acute   LEukocytosis  Diabetes     Pt while caring a diagnosis of dementia and previous chart references to not placing a pacemaker is alert and OX2 today and would like to proceed with pacing  At the NH he is ambulatory with crutches.  He has no family of which we are aware  I think given his mental status and his expressed desire for paicing and notwithstanding the references int the chart to not proceeding with pacing in the context of CHB but stable escape, we should proceed  The benefits and risks were reviewed including but not limited to death,  perforation, infection, lead dislodgement and device malfunction.  The patient understands agrees and is willing to proceed.  His leukocytosis is getting better and no evidence of systemic infection, signs or symptoms  Will need attention to his Diabetes msot resent 196 Renal function is an issue and would be optomistic that it would improve following restoration of AV synchrony        Signed, Francis Dowse, PA-C 12/01/2015 9:43 AM

## 2015-12-01 NOTE — Progress Notes (Signed)
  Echocardiogram 2D Echocardiogram has been performed.  Arvil Chaco 12/01/2015, 10:03 AM

## 2015-12-01 NOTE — Progress Notes (Signed)
OT Cancellation Note  Patient Details Name: Cody [name] MRN: [id number] DOB: 1932-10-30   Cancelled Treatment:    Reason Eval/Treat Not Completed: Patient not medically ready (pt with temp femoral pacemaker, will hold therapies)  Evette Georges [phone] 12/01/2015, 8:20 AM

## 2015-12-01 NOTE — Progress Notes (Signed)
Pt has baseline dementia and unable to sign consent for himself. I have called case manager at [facility] and she stated there was no family listed in any records and the SNF he lives at does not have any family listed either. The only relative we have in the chart is not an active phone number. Pt does not have a legal guardian. I have attempted to get in touch with someone to help make the decision and sign his consent. This procedure is medically necessary due to reliance on Temporary venous pacemaker and complete heart block.

## 2015-12-01 NOTE — Progress Notes (Signed)
[facility] PROGRESS NOTE   Cody [name] ZOX:[id number] DOB: 06/21/1933 DOA: 11/30/2015 PCP: No primary care provider on file.  HPI/Subjective: Has percutaneous pacemaker, unreliable historian. Denies any symptoms or signs this morning.  Assessment/Plan: Active Problems:   Syncope   BPH (benign prostatic hyperplasia)   Diabetes mellitus without complication (HCC)   Essential hypertension, benign   Symptomatic bradycardia   Asystole (HCC)   Nausea and vomiting   AKI (acute kidney injury) (HCC)   Asystole/complete heart block:  -Presented with syncope likely secondary to third degree AV block. -Patient seen by cardiology, EP called and after discussion with the patient PPM will be placed. -Awaiting the procedure.   Syncope:  -Patient does have syncope every now and then, increased lately.  -This is likely secondary to the third-degree AV block. -Hopefully this will resolved with the pacemaker placement.   Nausea and vomiting: viral etiology vs DM gastroparesis. WBC nml. No reported change in stools and abd nonttp - Reglan - No zofran of phenergan - IVF - CXR - concern for aspiration - Speech eval  AKI: Cr 1.27. Baseline 1.0. likely from GI loss and poor oral intake - IVF, check BMP in a.m.  HTN: - hold all medications.  DM: - 1/2 dose levemir - SSI   Dementia: at baseline. Difficult to assertain if pt truly has capacity. NH does not have a working number for next of kin. Number listed in Epic for emergency contact does not work.  - continue restoril, Haldol - Call case worker in am to try and locate family  BPH:  Code Status: Full Code Family Communication: Plan discussed with the patient. Disposition Plan: Remains inpatient Diet: Diet NPO time specified Except for: Sips with Meds Diet NPO time specified Except for: Sips with Meds  Consultants: EP cardiology   Procedures:   pacemaker placement pending.    Antibiotics:  None   Objective: Filed Vitals:   12/01/15 0700 12/01/15 0800  BP: 121/71 114/62  Pulse:    Temp:  98.5 F (36.9 C)  Resp: 23 20    Intake/Output Summary (Last 24 hours) at 12/01/15 1150 Last data filed at 12/01/15 0800  Gross per 24 hour  Intake 1115.25 ml  Output      0 ml  Net 1115.25 ml   Filed Weights   11/30/15 1256 11/30/15 2144 12/01/15 0400  Weight: 72.576 kg (160 lb) 75.1 kg (165 lb 9.1 oz) 76 kg (167 lb 8.8 oz)    Exam: General: Alert and awake, oriented x3, not in any acute distress. HEENT: anicteric sclera, pupils reactive to light and accommodation, EOMI CVS: S1-S2 clear, no murmur rubs or gallops Chest: clear to auscultation bilaterally, no wheezing, rales or rhonchi Abdomen: soft nontender, nondistended, normal bowel sounds, no organomegaly Extremities: no cyanosis, clubbing or edema noted bilaterally Neuro: Cranial nerves II-XII intact, no focal neurological deficits  Data Reviewed: Basic Metabolic Panel:  Recent Labs Lab 11/30/15 1324 11/30/15 2325 12/01/15 0205 12/01/15 0549  NA 140 142  --  140  K 4.5 4.1  --  4.6  CL 102 101  --  106  CO2 27 16*  --  21*  GLUCOSE 420* 492*  --  349*  BUN 36* 38*  --  43*  CREATININE 1.27* 2.07* 2.00* 2.07*  CALCIUM 8.9 8.7*  --  8.5*   Liver Function Tests:  Recent Labs Lab 11/30/15 2325  AST 77*  ALT 85*  ALKPHOS 84  BILITOT 1.4*  PROT 6.4*  ALBUMIN  3.2*   No results for input(s): LIPASE, AMYLASE in the last 168 hours. No results for input(s): AMMONIA in the last 168 hours. CBC:  Recent Labs Lab 11/30/15 1324 11/30/15 2325 12/01/15 0205 12/01/15 0549  WBC 8.9 14.6* 16.6* 13.5*  NEUTROABS 5.8  --   --   --   HGB 11.7* 12.2* 12.1* 11.8*  HCT 36.1* 38.7* 39.0 36.9*  MCV 88.5 90.2 89.0 86.8  PLT 159 147* 155 137*   Cardiac Enzymes:  Recent Labs Lab 11/30/15 1324  TROPONINI 0.04*   BNP (last 3 results) No results for input(s): BNP in the last 8760  hours.  ProBNP (last 3 results) No results for input(s): PROBNP in the last 8760 hours.  CBG:  Recent Labs Lab 11/30/15 2135 12/01/15 0020 12/01/15 0312 12/01/15 0804 12/01/15 1134  GLUCAP 420* 452* 379* 196* 98    Micro Recent Results (from the past 240 hour(s))  MRSA PCR Screening     Status: Abnormal   Collection Time: 11/30/15 10:32 PM  Result Value Ref Range Status   MRSA by PCR POSITIVE (A) NEGATIVE Final    Comment:        The GeneXpert MRSA Assay (FDA approved for NASAL specimens only), is one component of a comprehensive MRSA colonization surveillance program. It is not intended to diagnose MRSA infection nor to guide or monitor treatment for MRSA infections. RESULT CALLED TO, READ BACK BY AND VERIFIED WITH: N SNOW  12/01/15 MKELLY      Studies: Dg Chest Port 1 View  11/30/2015  CLINICAL DATA:  Syncopal episode EXAM: PORTABLE CHEST 1 VIEW COMPARISON:  07/16/2015 FINDINGS: Cardiac shadow is stable. The lungs are well aerated bilaterally. Increased density along the lateral chest wall on the right is noted consistent with effusion. This may be somewhat loculated given its appearance. No focal infiltrate is seen. IMPRESSION: Apparent loculated effusion laterally on the right. Electronically Signed   By: Alcide Clever M.D.   On: 11/30/2015 20:07    Scheduled Meds: . antiseptic oral rinse  7 mL Mouth Rinse BID  . aspirin  81 mg Oral Daily  . atropine  0.5 mg Intravenous Once  .  ceFAZolin (ANCEF) IV  2 g Intravenous To Cath  . Chlorhexidine Gluconate Cloth  6 each Topical Q0600  . gentamicin irrigation  80 mg Irrigation To Cath  . haloperidol  1 mg Oral QHS  . heparin  5,000 Units Subcutaneous 3 times per day  . hydrochlorothiazide  25 mg Oral Daily  . insulin aspart  0-20 Units Subcutaneous TID WC  . insulin aspart  0-5 Units Subcutaneous QHS  . insulin detemir  20 Units Subcutaneous QHS  . metoCLOPramide (REGLAN) injection  10 mg Intravenous 3 times  per day  . mupirocin ointment  1 application Nasal BID  . sodium chloride flush  3 mL Intravenous Q12H  . sodium chloride flush  3 mL Intravenous Q12H  . [START ON 12/02/2015] sodium chloride flush  3 mL Intravenous Q12H  . sodium chloride flush  3 mL Intravenous Q12H  . tamsulosin  0.4 mg Oral Daily   Continuous Infusions: . sodium chloride 75 mL/hr at 12/01/15 0239  . sodium chloride 50 mL/hr at 12/01/15 1045  . sodium chloride    . isoproterenol (ISUPREL) infusion Stopped (12/01/15 0016)       Time spent: 35 minutes    [facility]  [facility] Pager [phone] If 7PM-7AM, please contact night-coverage at [email or web address], password [facility] 12/01/2015, 11:50 AM  LOS: 1 day

## 2015-12-01 NOTE — Progress Notes (Signed)
PT Cancellation Note  Patient Details Name: Cody [name] MRN: [id number] DOB: 12-Apr-1933   Cancelled Treatment:    Reason Eval/Treat Not Completed: Patient not medically ready (pt with temp femoral pacemaker, will hold therapies)   Fabio Asa 12/01/2015, 8:15 AM Charlotte Crumb, PT DPT  [phone]

## 2015-12-02 ENCOUNTER — Inpatient Hospital Stay (HOSPITAL_COMMUNITY): Payer: Medicare Other

## 2015-12-02 ENCOUNTER — Encounter (HOSPITAL_COMMUNITY): Payer: Self-pay

## 2015-12-02 LAB — BASIC METABOLIC PANEL
Anion gap: 11 (ref 5–15)
BUN: 55 mg/dL — ABNORMAL HIGH (ref 6–20)
CALCIUM: 7.9 mg/dL — AB (ref 8.9–10.3)
CO2: 24 mmol/L (ref 22–32)
CREATININE: 1.73 mg/dL — AB (ref 0.61–1.24)
Chloride: 108 mmol/L (ref 101–111)
GFR, EST AFRICAN AMERICAN: 41 mL/min — AB (ref >60)
GFR, EST NON AFRICAN AMERICAN: 36 mL/min — AB (ref >60)
Glucose, Bld: 100 mg/dL — ABNORMAL HIGH (ref 65–99)
Potassium: 3.9 mmol/L (ref 3.5–5.1)
SODIUM: 143 mmol/L (ref 135–145)

## 2015-12-02 LAB — GLUCOSE, CAPILLARY
GLUCOSE-CAPILLARY: 109 mg/dL — AB (ref 65–99)
GLUCOSE-CAPILLARY: 87 mg/dL (ref 65–99)
GLUCOSE-CAPILLARY: 188 mg/dL — AB (ref 65–99)
Glucose-Capillary: 96 mg/dL (ref 65–99)
Glucose-Capillary: 190 mg/dL — ABNORMAL HIGH (ref 65–99)

## 2015-12-02 LAB — HEMOGLOBIN A1C
HEMOGLOBIN A1C: 8.7 % — AB (ref 4.8–5.6)
MEAN PLASMA GLUCOSE: 203 mg/dL

## 2015-12-02 MED ORDER — VITAMIN B-1 100 MG PO TABS
100.0000 mg | ORAL_TABLET | Freq: Every day | ORAL | Status: DC
  Administered 2015-12-02 – 2015-12-04 (×3): 100 mg via ORAL
  Filled 2015-12-02 (×3): qty 1

## 2015-12-02 MED ORDER — METOCLOPRAMIDE HCL 5 MG/ML IJ SOLN: 10.0000 mg | Freq: Three times a day (TID) | INTRAMUSCULAR | Status: DC | PRN

## 2015-12-02 MED ORDER — CARVEDILOL 3.125 MG PO TABS
3.1250 mg | ORAL_TABLET | Freq: Two times a day (BID) | ORAL | Status: DC
  Administered 2015-12-02 – 2015-12-04 (×5): 3.125 mg via ORAL
  Filled 2015-12-02 (×5): qty 1

## 2015-12-02 NOTE — Progress Notes (Signed)
SUBJECTIVE: The patient is doing well today.  At this time, he denies chest pain, shortness of breath, or any new concerns.  CURRENT MEDICATIONS: . antiseptic oral rinse  7 mL Mouth Rinse BID  . aspirin  81 mg Oral Daily  . atropine  0.5 mg Intravenous Once  .  ceFAZolin (ANCEF) IV  1 g Intravenous Q12H  . Chlorhexidine Gluconate Cloth  6 each Topical Q0600  . haloperidol  1 mg Oral QHS  . heparin  5,000 Units Subcutaneous 3 times per day  . hydrochlorothiazide  25 mg Oral Daily  . insulin aspart  0-20 Units Subcutaneous TID WC  . insulin aspart  0-5 Units Subcutaneous QHS  . insulin detemir  20 Units Subcutaneous QHS  . metoCLOPramide (REGLAN) injection  10 mg Intravenous 3 times per day  . mupirocin ointment  1 application Nasal BID  . sodium chloride flush  3 mL Intravenous Q12H  . sodium chloride flush  3 mL Intravenous Q12H  . sodium chloride flush  3 mL Intravenous Q12H  . tamsulosin  0.4 mg Oral Daily   . sodium chloride 75 mL/hr at 12/01/15 2100  . isoproterenol (ISUPREL) infusion Stopped (12/01/15 0016)    OBJECTIVE: Physical Exam: Filed Vitals:   12/02/15 0300 12/02/15 0345 12/02/15 0400 12/02/15 0500  BP: 162/72  160/76 164/73  Pulse:   60   Temp:  98.9 F (37.2 C)    TempSrc:  Oral    Resp: Height:      Weight:    165 lb 2 oz (74.9 kg)  SpO2: 95%  95% 94%    Intake/Output Summary (Last 24 hours) at 12/02/15 0604 Last data filed at 12/02/15 0400  Gross per 24 hour  Intake 1666.92 ml  Output    100 ml  Net 1566.92 ml    Telemetry reveals AV pacing  GEN- The patient is elderly and chronically ill appearing, asleep but rouses Head- normocephalic, atraumatic Eyes-  Sclera clear, conjunctiva pink Ears- hearing intact Oropharynx- clear Neck- supple Lungs- Clear to ausculation bilaterally, normal work of breathing Heart- Regular rate and rhythm (paced) GI- soft, NT, ND, + BS Extremities- no clubbing, cyanosis, or edema Skin- no rash or  lesion Psych- euthymic mood, full affect Neuro- strength and sensation are intact  LABS: Basic Metabolic Panel:  Recent Labs  81/19/14 2325 12/01/15 0205 12/01/15 0549  NA 142  --  140  K 4.1  --  4.6  CL 101  --  106  CO2 16*  --  21*  GLUCOSE 492*  --  349*  BUN 38*  --  43*  CREATININE 2.07* 2.00* 2.07*  CALCIUM 8.7*  --  8.5*   Liver Function Tests:  Recent Labs  11/30/15 2325  AST 77*  ALT 85*  ALKPHOS 84  BILITOT 1.4*  PROT 6.4*  ALBUMIN 3.2*   CBC:  Recent Labs  11/30/15 1324  12/01/15 0205 12/01/15 0549  WBC 8.9  < > 16.6* 13.5*  NEUTROABS 5.8  --   --   --   HGB 11.7*  < > 12.1* 11.8*  HCT 36.1*  < > 39.0 36.9*  MCV 88.5  < > 89.0 86.8  PLT 159  < > 155 137*  < > = values in this interval not displayed. Cardiac Enzymes:  Recent Labs  11/30/15 1324 12/01/15 1037  TROPONINI 0.04* 0.89*   Hemoglobin A1C:  Recent Labs  11/30/15 1324  HGBA1C 8.7*  Thyroid Function Tests:  Recent Labs  11/30/15 2325  TSH 2.624    RADIOLOGY: Dg Chest Port 1 View 12/02/2015  CLINICAL DATA:  Cardiac device in situ. EXAM: PORTABLE CHEST 1 VIEW COMPARISON:  11/30/2015 FINDINGS: Cardiac pacemaker. No pneumothorax. Shallow inspiration with elevation of right hemidiaphragm. Atelectasis in the right lung base. Small right pleural effusion layering along the lateral aspect, possibly loculated. Mild cardiac enlargement with central perihilar infiltration possibly due to edema. IMPRESSION: Cardiac enlargement with mild perihilar edema. Atelectasis in the right lung base with small right pleural effusion. Electronically Signed   By: Burman Nieves M.D.   On: 12/02/2015 04:57    ASSESSMENT AND PLAN:  Principal Problem:   Atrioventricular block, complete (HCC) Active Problems:   Syncope   BPH (benign prostatic hyperplasia)   Diabetes mellitus without complication (HCC)   Essential hypertension, benign   Symptomatic bradycardia   Nausea and vomiting   AKI  (acute kidney injury) (HCC)    1.  Complete heart block/syncope S/p PPM implant 12-01-15 CXR this morning with leads in stable position, no ptx Device interrogation reviewed and normal 2.  AKI Repeat BMET this morning.  Hopefully will improve now with pacemaker in place  3.  HTN BP elevated today Will add Coreg with LV dysfunction and can uptitrate as needed  4.  Dementia Per primary team  Ok from EP standpoint to transfer to telemetry today and discharge home tomorrow if other medical issues are improved.  Appts and instructions entered in AVS.   Electrophysiology team to see as needed while here. Please call with questions.  Hillis Range MD 12/02/2015

## 2015-12-02 NOTE — Progress Notes (Signed)
Ref: No primary care provider on file.   Subjective:  Feeling better. No chest pain. Pacer pocket with mild eccymosis. Appreciate Dr. Clide Cliff and Dr. Johney Frame for pacemaker.  Objective:  Vital Signs in the last 24 hours: Temp:  [98.3 F (36.8 C)-98.9 F (37.2 C)] 98.3 F (36.8 C) (02/18 0800) Pulse Rate:  [0-105] 60 (02/18 0400) Cardiac Rhythm:  [-] A-V Sequential paced (02/18 0400) Resp:  [13-32] 23 (02/18 0800) BP: (137-187)/(68-104) 171/83 mmHg (02/18 0800) SpO2:  [88 %-98 %] 94 % (02/18 0800) Weight:  [74.9 kg (165 lb 2 oz)] 74.9 kg (165 lb 2 oz) (02/18 0500)  Physical Exam: BP Readings from Last 1 Encounters:  12/02/15 171/83    Wt Readings from Last 1 Encounters:  12/02/15 74.9 kg (165 lb 2 oz)    Weight change: 2.324 kg (5 lb 2 oz)  HEENT: [place]/AT, Eyes-Blind in left eye. Conjunctiva-Pink, Sclera-Non-icteric Neck: No JVD, No bruit, Trachea midline. Lungs:  Clear, Bilateral. Cardiac:  Regular a paced rhythm, normal S1 and S2, no S3.  Abdomen:  Soft, non-tender. Extremities:  No edema present. No cyanosis. No clubbing. CNS: AxOx2, Cranial nerves grossly intact, moves all 4 extremities. Right handed. Skin: Warm and dry.   Intake/Output from previous day: 02/17 0701 - 02/18 0700 In: 1752.3 [I.V.:1752.3] Out: 400 [Urine:400]    Lab Results: BMET    Component Value Date/Time   NA 143 12/02/2015 0705   NA 140 12/01/2015 0549   NA 142 11/30/2015 2325   K 3.9 12/02/2015 0705   K 4.6 12/01/2015 0549   K 4.1 11/30/2015 2325   CL 108 12/02/2015 0705   CL 106 12/01/2015 0549   CL 101 11/30/2015 2325   CO2 24 12/02/2015 0705   CO2 21* 12/01/2015 0549   CO2 16* 11/30/2015 2325   GLUCOSE 100* 12/02/2015 0705   GLUCOSE 349* 12/01/2015 0549   GLUCOSE 492* 11/30/2015 2325   BUN 55* 12/02/2015 0705   BUN 43* 12/01/2015 0549   BUN 38* 11/30/2015 2325   CREATININE 1.73* 12/02/2015 0705   CREATININE 2.07* 12/01/2015 0549   CREATININE 2.00* 12/01/2015 0205   CALCIUM 7.9*  12/02/2015 0705   CALCIUM 8.5* 12/01/2015 0549   CALCIUM 8.7* 11/30/2015 2325   GFRNONAA 36* 12/02/2015 0705   GFRNONAA 29* 12/01/2015 0549   GFRNONAA 30* 12/01/2015 0205   GFRAA 41* 12/02/2015 0705   GFRAA 33* 12/01/2015 0549   GFRAA 35* 12/01/2015 0205   CBC    Component Value Date/Time   WBC 13.5* 12/01/2015 0549   RBC 4.25 12/01/2015 0549   HGB 11.8* 12/01/2015 0549   HCT 36.9* 12/01/2015 0549   PLT 137* 12/01/2015 0549   MCV 86.8 12/01/2015 0549   MCH 27.8 12/01/2015 0549   MCHC 32.0 12/01/2015 0549   RDW 13.6 12/01/2015 0549   LYMPHSABS 2.0 11/30/2015 1324   MONOABS 0.9 11/30/2015 1324   EOSABS 0.2 11/30/2015 1324   BASOSABS 0.0 11/30/2015 1324   HEPATIC Function Panel  Recent Labs  12/05/14 1905 12/06/14 0626 11/30/15 2325  PROT 7.1 6.2 6.4*   HEMOGLOBIN A1C No components found for: HGA1C,  MPG CARDIAC ENZYMES Lab Results  Component Value Date   TROPONINI 0.89* 12/01/2015   TROPONINI 0.04* 11/30/2015   TROPONINI <0.03 07/16/2015   BNP No results for input(s): PROBNP in the last 8760 hours. TSH  Recent Labs  10/18/15 0729 11/30/15 2325  TSH 3.438 2.624   CHOLESTEROL No results for input(s): CHOL in the last 8760 hours.  Scheduled Meds: . antiseptic oral rinse  7 mL Mouth Rinse BID  . aspirin  81 mg Oral Daily  . atropine  0.5 mg Intravenous Once  . carvedilol  3.125 mg Oral BID WC  .  ceFAZolin (ANCEF) IV  1 g Intravenous Q12H  . Chlorhexidine Gluconate Cloth  6 each Topical Q0600  . haloperidol  1 mg Oral QHS  . heparin  5,000 Units Subcutaneous 3 times per day  . hydrochlorothiazide  25 mg Oral Daily  . insulin aspart  0-20 Units Subcutaneous TID WC  . insulin aspart  0-5 Units Subcutaneous QHS  . insulin detemir  20 Units Subcutaneous QHS  . metoCLOPramide (REGLAN) injection  10 mg Intravenous 3 times per day  . mupirocin ointment  1 application Nasal BID  . sodium chloride flush  3 mL Intravenous Q12H  . sodium chloride flush  3 mL  Intravenous Q12H  . sodium chloride flush  3 mL Intravenous Q12H  . tamsulosin  0.4 mg Oral Daily   Continuous Infusions: . sodium chloride 75 mL/hr at 12/02/15 0700  . isoproterenol (ISUPREL) infusion Stopped (12/01/15 0016)   PRN Meds:.sodium chloride, acetaminophen, midazolam, ondansetron (ZOFRAN) IV, sodium chloride flush, temazepam  Assessment/Plan: Status post pacemaker placement for complete heart block Essential hypertension Type II DM Dementia Parkinson's disease  Transfer to telemetry. Increase activity.   LOS: 2 days    Orpah Cobb  MD  12/02/2015, 9:05 AM

## 2015-12-02 NOTE — Evaluation (Signed)
Physical Therapy Evaluation Patient Details Name: Cody [name] MRN: [id number] DOB: 1933-01-01 Today's Date: 12/02/2015   History of Present Illness  Cody [name] is a 82 y.o. male with PMHx of advanced dementia, Parkinson's disease, legally blind with poor functional capacity requires assistance with daily ADL's, lives in a SNF. He has known history of bradycardia with heart block in the past, in discussion with Dr. Wyline Mood, stable escape rhythm noted historically in the 40's with good BP, and given his significant comorbidities and functional decline had avoided a PPM implant. He was brought to [facility] yesterday after a syncopal event at the SNF, possibly a fall. He was evaluated in the ED had CHB with good BP and without symptom at that time, with suspect dehydration was contributing to his event, though afterwards suffered an event of asystole associated with LOC, and he was transferred to [facility] for further care and management. Once here, he was observed to have ongoing CHB with rates into the 20's and more asystolic events and temporary pacing wire/pacing was done by Dr. Algie Coffer overnight. His BP has been stable   Pacemaker placed 12/01/15.   Clinical Impression  Pt admitted with above diagnosis. Pt currently with functional limitations due to the deficits listed below (see PT Problem List). Pt was able to stand and pivot to recliner with +2 mod assist and cues as well as RW.  Pt will need therapy at SNF to return to baseline level.  Will follow acutely.  Pt will benefit from skilled PT to increase their independence and safety with mobility to allow discharge to the venue listed below.      Follow Up Recommendations SNF;Supervision/Assistance - 24 hour    Equipment Recommendations  None recommended by PT    Recommendations for Other Services       Precautions / Restrictions Precautions Precautions: Fall;ICD/Pacemaker Precaution Comments: pt is legallly  blind Restrictions Weight Bearing Restrictions: No      Mobility  Bed Mobility Overal bed mobility: Needs Assistance;+2 for physical assistance Bed Mobility: Supine to Sit     Supine to sit: Mod assist;+2 for physical assistance     General bed mobility comments: Needed asssit to move LEs and for elevation of trunk.   Transfers Overall transfer level: Needs assistance Equipment used: Rolling walker (2 wheeled) Transfers: Sit to/from [employer] Sit to Stand: Mod assist;+2 physical assistance Stand pivot transfers: Mod assist;+2 physical assistance       General transfer comment: Pt needed assist to power up.  Pt leans to his right but was able to take pivotal steps with RW to the chair.  Needed steadying assist.  Pt needed assist to control descent into chair as pt did not use hands to slow descent.    Ambulation/Gait                Stairs            Wheelchair Mobility    Modified Rankin (Stroke Patients Only)       Balance Overall balance assessment: Needs assistance;History of Falls Sitting-balance support: No upper extremity supported;Feet supported Sitting balance-Leahy Scale: Fair   Postural control: Right lateral lean Standing balance support: Bilateral upper extremity supported;During functional activity Standing balance-Leahy Scale: Poor Standing balance comment: Pt was able to stand with RW with mod assist but needed assist and cues for standing balance.  right lateral and posterior lean.  Pertinent Vitals/Pain Pain Assessment: No/denies pain  98% on 2LO2, BP 142/65 with HR 64 bpm.     Home Living Family/patient expects to be discharged to:: Skilled nursing facility Living Arrangements: Other (Comment) (has lived in NH for 6 years per pt) Available Help at Discharge: Skilled Nursing Facility;Available 24 hours/day Type of Home: Skilled Nursing Facility Home Access: Level entry      Home Layout: One level Home Equipment: Walker - 2 wheels;Bedside commode;Shower seat      Prior Function Level of Independence: Needs assistance   Gait / Transfers Assistance Needed: used RW at facility to get to meals per pt  ADL's / Homemaking Assistance Needed: Staff assisted pt.        Hand Dominance        Extremity/Trunk Assessment   Upper Extremity Assessment: Defer to OT evaluation           Lower Extremity Assessment: Generalized weakness         Communication   Communication: No difficulties  Cognition Arousal/Alertness: Awake/alert Behavior During Therapy: Flat affect Overall Cognitive Status: History of cognitive impairments - at baseline                      General Comments      Exercises General Exercises - Lower Extremity Ankle Circles/Pumps: AROM;Both;10 reps;Seated Long Arc Quad: AROM;Both;10 reps;Seated      Assessment/Plan    PT Assessment Patient needs continued PT services  PT Diagnosis Generalized weakness   PT Problem List Decreased activity tolerance;Decreased balance;Decreased strength;Decreased mobility;Decreased knowledge of use of DME;Decreased safety awareness;Decreased cognition;Decreased knowledge of precautions  PT Treatment Interventions DME instruction;Gait training;Functional mobility training;Therapeutic activities;Therapeutic exercise;Balance training;Cognitive remediation;Patient/family education   PT Goals (Current goals can be found in the Care Plan section) Acute Rehab PT Goals Patient Stated Goal: to go back to SNF PT Goal Formulation: With patient Time For Goal Achievement: 12/16/15 Potential to Achieve Goals: Good    Frequency Min 3X/week   Barriers to discharge Decreased caregiver support      Co-evaluation               End of Session Equipment Utilized During Treatment: Gait belt;Oxygen Activity Tolerance: Patient limited by fatigue Patient left: in chair;with call bell/phone  within reach;with chair alarm set Nurse Communication: Mobility status;Need for lift equipment         Time: 1610-9604 PT Time Calculation (min) (ACUTE ONLY): 15 min   Charges:   PT Evaluation $PT Eval Moderate Complexity: 1 Procedure     PT G CodesTawni Millers F 2015/12/15, 2:04 PM  Rosabell Geyer,PT Acute Rehabilitation [phone] (pager)

## 2015-12-02 NOTE — Progress Notes (Signed)
Spoke with RN. Patient now on a po diet, consuming well per RN. Per RN, after discussion with MD, discontinuing order. Please re-consult if needed. Patient with risk factors for dysphagia including Parkinson's and dementia however per RN, consuming pos well prior to admission without difficulty.  Ferdinand Lango MA, CCC-SLP [phone]

## 2015-12-02 NOTE — Progress Notes (Signed)
[facility] PROGRESS NOTE   Cody [name] WUJ:[id number] DOB: 1932-11-30 DOA: 11/30/2015 PCP: No primary care provider on file.  HPI/Subjective: Permanent pacemaker placed, will transfer to telemetry for observation today.  Assessment/Plan: Principal Problem:   Atrioventricular block, complete (HCC) Active Problems:   Syncope   BPH (benign prostatic hyperplasia)   Diabetes mellitus without complication (HCC)   Essential hypertension, benign   Symptomatic bradycardia   Nausea and vomiting   AKI (acute kidney injury) (HCC)   Asystole/complete heart block:  -Presented with syncope likely secondary to third degree AV block. -Patient seen by cardiology, EP called and after discussion with the patient PPM will be placed. -PPM placed, transferred to telemetry. Symptoms free  Syncope:  -Patient does have syncope every now and then, increased lately.  -This is likely secondary to the third-degree AV block. -Hopefully this will resolved with the pacemaker placement. -We'll try to get him up, PT to evaluate.   Nausea and vomiting: viral etiology vs DM gastroparesis. WBC nml. No reported change in stools and abd nonttp - Reglan - No zofran of phenergan - IVF - CXR no evidence of pneumonia or aspiration.  AKI: Cr 1.27. Baseline 1.0. likely from GI loss and poor oral intake - Creatinine is at 1.73, continue IV fluids, check BMP in a.m.  HTN: - hold all medications.  DM: - 1/2 dose levemir - SSI   Dementia: at baseline. Difficult to assertain if pt truly has capacity. NH does not have a working number for next of kin. Number listed in Epic for emergency contact does not work.  - continue restoril, Haldol - Call case worker in am to try and locate family  BPH:  Code Status: Full Code Family Communication: Plan discussed with the patient. Disposition Plan: Remains inpatient Diet: DIET DYS 3 Room service appropriate?: Yes; Fluid consistency::  Thin  Consultants: EP cardiology   Procedures:   pacemaker placement pending.   Antibiotics:  None   Objective: Filed Vitals:   12/02/15 0925 12/02/15 1000  BP: 174/83 165/78  Pulse:    Temp:    Resp: 22 22    Intake/Output Summary (Last 24 hours) at 12/02/15 1116 Last data filed at 12/02/15 0900  Gross per 24 hour  Intake 1588.5 ml  Output    400 ml  Net 1188.5 ml   Filed Weights   11/30/15 2144 12/01/15 0400 12/02/15 0500  Weight: 75.1 kg (165 lb 9.1 oz) 76 kg (167 lb 8.8 oz) 74.9 kg (165 lb 2 oz)    Exam: General: Alert and awake, oriented x3, not in any acute distress. HEENT: anicteric sclera, pupils reactive to light and accommodation, EOMI CVS: S1-S2 clear, no murmur rubs or gallops Chest: clear to auscultation bilaterally, no wheezing, rales or rhonchi Abdomen: soft nontender, nondistended, normal bowel sounds, no organomegaly Extremities: no cyanosis, clubbing or edema noted bilaterally Neuro: Cranial nerves II-XII intact, no focal neurological deficits  Data Reviewed: Basic Metabolic Panel:  Recent Labs Lab 11/30/15 1324 11/30/15 2325 12/01/15 0205 12/01/15 0549 12/02/15 0705  NA 140 142  --  140 143  K 4.5 4.1  --  4.6 3.9  CL 102 101  --  106 108  CO2 27 16*  --  21* 24  GLUCOSE 420* 492*  --  349* 100*  BUN 36* 38*  --  43* 55*  CREATININE 1.27* 2.07* 2.00* 2.07* 1.73*  CALCIUM 8.9 8.7*  --  8.5* 7.9*   Liver Function Tests:  Recent Labs Lab 11/30/15  2325  AST 77*  ALT 85*  ALKPHOS 84  BILITOT 1.4*  PROT 6.4*  ALBUMIN 3.2*   No results for input(s): LIPASE, AMYLASE in the last 168 hours. No results for input(s): AMMONIA in the last 168 hours. CBC:  Recent Labs Lab 11/30/15 1324 11/30/15 2325 12/01/15 0205 12/01/15 0549  WBC 8.9 14.6* 16.6* 13.5*  NEUTROABS 5.8  --   --   --   HGB 11.7* 12.2* 12.1* 11.8*  HCT 36.1* 38.7* 39.0 36.9*  MCV 88.5 90.2 89.0 86.8  PLT 159 147* 155 137*   Cardiac Enzymes:  Recent  Labs Lab 11/30/15 1324 12/01/15 1037  TROPONINI 0.04* 0.89*   BNP (last 3 results) No results for input(s): BNP in the last 8760 hours.  ProBNP (last 3 results) No results for input(s): PROBNP in the last 8760 hours.  CBG:  Recent Labs Lab 12/01/15 1529 12/01/15 1734 12/01/15 2106 12/02/15 0541 12/02/15 0742  GLUCAP 85 97 119* 109* 96    Micro Recent Results (from the past 240 hour(s))  MRSA PCR Screening     Status: Abnormal   Collection Time: 11/30/15 10:32 PM  Result Value Ref Range Status   MRSA by PCR POSITIVE (A) NEGATIVE Final    Comment:        The GeneXpert MRSA Assay (FDA approved for NASAL specimens only), is one component of a comprehensive MRSA colonization surveillance program. It is not intended to diagnose MRSA infection nor to guide or monitor treatment for MRSA infections. RESULT CALLED TO, READ BACK BY AND VERIFIED WITH: N SNOW  12/01/15 MKELLY      Studies: Dg Chest Port 1 View  12/02/2015  CLINICAL DATA:  Cardiac device in situ. EXAM: PORTABLE CHEST 1 VIEW COMPARISON:  11/30/2015 FINDINGS: Cardiac pacemaker. No pneumothorax. Shallow inspiration with elevation of right hemidiaphragm. Atelectasis in the right lung base. Small right pleural effusion layering along the lateral aspect, possibly loculated. Mild cardiac enlargement with central perihilar infiltration possibly due to edema. IMPRESSION: Cardiac enlargement with mild perihilar edema. Atelectasis in the right lung base with small right pleural effusion. Electronically Signed   By: Burman Nieves M.D.   On: 12/02/2015 04:57   Dg Chest Port 1 View  11/30/2015  CLINICAL DATA:  Syncopal episode EXAM: PORTABLE CHEST 1 VIEW COMPARISON:  07/16/2015 FINDINGS: Cardiac shadow is stable. The lungs are well aerated bilaterally. Increased density along the lateral chest wall on the right is noted consistent with effusion. This may be somewhat loculated given its appearance. No focal infiltrate is  seen. IMPRESSION: Apparent loculated effusion laterally on the right. Electronically Signed   By: Alcide Clever M.D.   On: 11/30/2015 20:07    Scheduled Meds: . antiseptic oral rinse  7 mL Mouth Rinse BID  . aspirin  81 mg Oral Daily  . atropine  0.5 mg Intravenous Once  . carvedilol  3.125 mg Oral BID WC  .  ceFAZolin (ANCEF) IV  1 g Intravenous Q12H  . Chlorhexidine Gluconate Cloth  6 each Topical Q0600  . haloperidol  1 mg Oral QHS  . heparin  5,000 Units Subcutaneous 3 times per day  . hydrochlorothiazide  25 mg Oral Daily  . insulin aspart  0-20 Units Subcutaneous TID WC  . insulin aspart  0-5 Units Subcutaneous QHS  . insulin detemir  20 Units Subcutaneous QHS  . metoCLOPramide (REGLAN) injection  10 mg Intravenous 3 times per day  . mupirocin ointment  1 application Nasal BID  . sodium  chloride flush  3 mL Intravenous Q12H  . sodium chloride flush  3 mL Intravenous Q12H  . sodium chloride flush  3 mL Intravenous Q12H  . tamsulosin  0.4 mg Oral Daily   Continuous Infusions: . sodium chloride 75 mL/hr at 12/02/15 0700  . isoproterenol (ISUPREL) infusion Stopped (12/01/15 0016)       Time spent: 35 minutes    [facility]  [facility] Pager [phone] If 7PM-7AM, please contact night-coverage at [email or web address], password [facility] 12/02/2015, 11:16 AM  LOS: 2 days

## 2015-12-03 DIAGNOSIS — R001 Bradycardia, unspecified: Secondary | ICD-10-CM

## 2015-12-03 LAB — CBC
HEMATOCRIT: 35.5 % — AB (ref 39.0–52.0)
Hemoglobin: 11.6 g/dL — ABNORMAL LOW (ref 13.0–17.0)
MCH: 29.1 pg (ref 26.0–34.0)
MCHC: 32.7 g/dL (ref 30.0–36.0)
MCV: 89.2 fL (ref 78.0–100.0)
Platelets: 117 10*3/uL — ABNORMAL LOW (ref 150–400)
RBC: 3.98 MIL/uL — ABNORMAL LOW (ref 4.22–5.81)
RDW: 14 % (ref 11.5–15.5)
WBC: 10.8 10*3/uL — ABNORMAL HIGH (ref 4.0–10.5)

## 2015-12-03 LAB — GLUCOSE, CAPILLARY
GLUCOSE-CAPILLARY: 257 mg/dL — AB (ref 65–99)
GLUCOSE-CAPILLARY: 243 mg/dL — AB (ref 65–99)
Glucose-Capillary: 125 mg/dL — ABNORMAL HIGH (ref 65–99)
Glucose-Capillary: 163 mg/dL — ABNORMAL HIGH (ref 65–99)
Glucose-Capillary: 154 mg/dL — ABNORMAL HIGH (ref 65–99)

## 2015-12-03 LAB — BASIC METABOLIC PANEL
Anion gap: 8 (ref 5–15)
BUN: 47 mg/dL — AB (ref 6–20)
CHLORIDE: 109 mmol/L (ref 101–111)
CO2: 25 mmol/L (ref 22–32)
Calcium: 8 mg/dL — ABNORMAL LOW (ref 8.9–10.3)
Creatinine, Ser: 1.36 mg/dL — ABNORMAL HIGH (ref 0.61–1.24)
GFR calc Af Amer: 55 mL/min — ABNORMAL LOW (ref >60)
GFR calc non Af Amer: 48 mL/min — ABNORMAL LOW (ref >60)
GLUCOSE: 172 mg/dL — AB (ref 65–99)
POTASSIUM: 3.7 mmol/L (ref 3.5–5.1)
Sodium: 142 mmol/L (ref 135–145)
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
## 2015-12-03 NOTE — Progress Notes (Signed)
Ref: No primary care provider on file.   Subjective:  Feeling better. Stable pacer pocket. Afebrile.  Objective:  Vital Signs in the last 24 hours: Temp:  [97.3 F (36.3 C)-98.7 F (37.1 C)] 98.7 F (37.1 C) (02/19 0400) Pulse Rate:  [60-66] 66 (02/19 0400) Cardiac Rhythm:  [-] A-V Sequential paced (02/19 0723) Resp:  [16-21] 18 (02/19 0400) BP: (150-167)/(69-94) 158/69 mmHg (02/19 0400) SpO2:  [94 %-100 %] 100 % (02/19 0400) Weight:  [75.6 kg (166 lb 10.7 oz)] 75.6 kg (166 lb 10.7 oz) (02/19 0400)  Physical Exam: BP Readings from Last 1 Encounters:  12/03/15 158/69    Wt Readings from Last 1 Encounters:  12/03/15 75.6 kg (166 lb 10.7 oz)    Weight change: 0.7 kg (1 lb 8.7 oz)  HEENT: [place]/AT, Eyes-Blind in left eye, EOMI, Conjunctiva-Pink, Sclera-Non-icteric Neck: No JVD, No bruit, Trachea midline. Lungs:  Clear, Bilateral. Cardiac:  Regular, A-V paced rhythm, normal S1 and S2, no S3.  Abdomen:  Soft, non-tender. Extremities:  No edema present. No cyanosis. No clubbing. CNS: AxOx2, Cranial nerves grossly intact, moves all 4 extremities. Right handed. Skin: Warm and dry.   Intake/Output from previous day: 02/18 0701 - 02/19 0700 In: 1195 [P.O.:600; I.V.:595] Out: 675 [Urine:675]    Lab Results: BMET    Component Value Date/Time   NA 142 12/03/2015 0255   NA 143 12/02/2015 0705   NA 140 12/01/2015 0549   K 3.7 12/03/2015 0255   K 3.9 12/02/2015 0705   K 4.6 12/01/2015 0549   CL 109 12/03/2015 0255   CL 108 12/02/2015 0705   CL 106 12/01/2015 0549   CO2 25 12/03/2015 0255   CO2 24 12/02/2015 0705   CO2 21* 12/01/2015 0549   GLUCOSE 172* 12/03/2015 0255   GLUCOSE 100* 12/02/2015 0705   GLUCOSE 349* 12/01/2015 0549   BUN 47* 12/03/2015 0255   BUN 55* 12/02/2015 0705   BUN 43* 12/01/2015 0549   CREATININE 1.36* 12/03/2015 0255   CREATININE 1.73* 12/02/2015 0705   CREATININE 2.07* 12/01/2015 0549   CALCIUM 8.0* 12/03/2015 0255   CALCIUM 7.9* 12/02/2015 0705    CALCIUM 8.5* 12/01/2015 0549   GFRNONAA 48* 12/03/2015 0255   GFRNONAA 36* 12/02/2015 0705   GFRNONAA 29* 12/01/2015 0549   GFRAA 55* 12/03/2015 0255   GFRAA 41* 12/02/2015 0705   GFRAA 33* 12/01/2015 0549   CBC    Component Value Date/Time   WBC 10.8* 12/03/2015 0255   RBC 3.98* 12/03/2015 0255   HGB 11.6* 12/03/2015 0255   HCT 35.5* 12/03/2015 0255   PLT 117* 12/03/2015 0255   MCV 89.2 12/03/2015 0255   MCH 29.1 12/03/2015 0255   MCHC 32.7 12/03/2015 0255   RDW 14.0 12/03/2015 0255   LYMPHSABS 2.0 11/30/2015 1324   MONOABS 0.9 11/30/2015 1324   EOSABS 0.2 11/30/2015 1324   BASOSABS 0.0 11/30/2015 1324   HEPATIC Function Panel  Recent Labs  12/05/14 1905 12/06/14 0626 11/30/15 2325  PROT 7.1 6.2 6.4*   HEMOGLOBIN A1C No components found for: HGA1C,  MPG CARDIAC ENZYMES Lab Results  Component Value Date   TROPONINI 0.89* 12/01/2015   TROPONINI 0.04* 11/30/2015   TROPONINI <0.03 07/16/2015   BNP No results for input(s): PROBNP in the last 8760 hours. TSH  Recent Labs  10/18/15 0729 11/30/15 2325  TSH 3.438 2.624   CHOLESTEROL No results for input(s): CHOL in the last 8760 hours.  Scheduled Meds: . antiseptic oral rinse  7 mL Mouth  Rinse BID  . aspirin  81 mg Oral Daily  . carvedilol  3.125 mg Oral BID WC  . Chlorhexidine Gluconate Cloth  6 each Topical Q0600  . haloperidol  1 mg Oral QHS  . heparin  5,000 Units Subcutaneous 3 times per day  . hydrochlorothiazide  25 mg Oral Daily  . insulin aspart  0-20 Units Subcutaneous TID WC  . insulin aspart  0-5 Units Subcutaneous QHS  . insulin detemir  20 Units Subcutaneous QHS  . mupirocin ointment  1 application Nasal BID  . sodium chloride flush  3 mL Intravenous Q12H  . tamsulosin  0.4 mg Oral Daily  . thiamine  100 mg Oral Daily   Continuous Infusions:  PRN Meds:.acetaminophen, metoCLOPramide (REGLAN) injection, ondansetron (ZOFRAN) IV, temazepam  Assessment/Plan: Status post pacemaker  placement for complete heart block Essential hypertension Type II DM Dementia Parkinson's disease Acute renal failure-improving   Continue medical treatment.   LOS: 3 days    Orpah Cobb  MD  12/03/2015, 10:00 AM

## 2015-12-03 NOTE — NC FL2 (Signed)
MEDICAID FL2 LEVEL OF CARE SCREENING TOOL     IDENTIFICATION  Patient Name: Cody [name] Birthdate: 1932/12/05 Sex: male Admission Date (Current Location): 11/30/2015  [facility] and [place] Number:  [employer] and Address:  [facility], [address] [place], [place] [address]      Provider Number: [id number]  Attending Physician Name and Address:  Clydia Llano, MD  Relative Name and Phone Number:  Richardean Chimera Relative [phone]    Current Level of Care: Hospital Recommended Level of Care: Assisted Living Facility Prior Approval Number:    Date Approved/Denied:   PASRR Number:    Discharge Plan: Other (Comment) [facility])    Current Diagnoses: Patient Active Problem List   Diagnosis Date Noted  . Symptomatic bradycardia 11/30/2015  . Atrioventricular block, complete (HCC) 11/30/2015  . Nausea and vomiting 11/30/2015  . AKI (acute kidney injury) (HCC) 11/30/2015  . Complete heart block (HCC)   . Faintness   . Essential hypertension, benign 09/28/2015  . Sepsis (HCC) 12/06/2014  . Influenza, pneumonia 12/06/2014  . Syncope 12/05/2014  . BPH (benign prostatic hyperplasia) 12/05/2014  . Dementia 12/05/2014  . Diabetes mellitus without complication (HCC) 12/05/2014    Orientation RESPIRATION BLADDER Height & Weight     Place, Situation, Self  Normal Continent Weight: 166 lb 10.7 oz (75.6 kg) Height:   (170.2 cm) (estimated)  BEHAVIORAL SYMPTOMS/MOOD NEUROLOGICAL BOWEL NUTRITION STATUS      Continent Diet (Carb Modified)  AMBULATORY STATUS COMMUNICATION OF NEEDS Skin   Supervision Verbally Surgical wounds                       Personal Care Assistance Level of Assistance  Bathing Bathing Assistance: Limited assistance         Functional Limitations Info             SPECIAL CARE FACTORS FREQUENCY  PT (By licensed PT)     PT Frequency: 3x a day               Contractures      Additional Factors Info  Allergies   Allergies Info: SULFA ANTIBIOTICS           Current Medications (12/03/2015):  This is the current hospital active medication list Current Facility-Administered Medications  Medication Dose Route Frequency Provider Last Rate Last Dose  . acetaminophen (TYLENOL) tablet 325-650 mg  325-650 mg Oral Q4H PRN Duke Salvia, MD      . antiseptic oral rinse (CPC / CETYLPYRIDINIUM CHLORIDE 0.05%) solution 7 mL  7 mL Mouth Rinse BID Lorretta Harp, MD   7 mL at 12/03/15 1000  . aspirin chewable tablet 81 mg  81 mg Oral Daily Joellyn Rued, MD   81 mg at 12/03/15 1100  . carvedilol (COREG) tablet 3.125 mg  3.125 mg Oral BID WC Marily Lente, NP   3.125 mg at 12/03/15 0818  . Chlorhexidine Gluconate Cloth 2 % PADS 6 each  6 each Topical Q0600 Orpah Cobb, MD   6 each at 12/03/15 0439  . haloperidol (HALDOL) tablet 1 mg  1 mg Oral QHS Ozella Rocks, MD   1 mg at 12/02/15 2228  . heparin injection 5,000 Units  5,000 Units Subcutaneous 3 times per day Ozella Rocks, MD   5,000 Units at 12/03/15 [phone]  . hydrochlorothiazide (HYDRODIURIL) tablet 25 mg  25 mg Oral Daily  Ozella Rocks, MD   25 mg at 12/03/15 1100  . insulin aspart (novoLOG) injection 0-20 Units  0-20 Units Subcutaneous TID WC Ozella Rocks, MD   4 Units at 12/02/15 1734  . insulin aspart (novoLOG) injection 0-5 Units  0-5 Units Subcutaneous QHS Ozella Rocks, MD   5 Units at 12/01/15 0033  . insulin detemir (LEVEMIR) injection 20 Units  20 Units Subcutaneous QHS Ozella Rocks, MD   20 Units at 12/02/15 2228  . metoCLOPramide (REGLAN) injection 10 mg  10 mg Intravenous TID PRN Clydia Llano, MD      . mupirocin ointment (BACTROBAN) 2 % 1 application  1 application Nasal BID Orpah Cobb, MD   1 application at 12/03/15 1000  . ondansetron (ZOFRAN) injection 4 mg  4 mg Intravenous Q6H PRN Duke Salvia, MD      . sodium chloride flush (NS) 0.9 % injection 3 mL  3 mL  Intravenous Q12H Ozella Rocks, MD   3 mL at 12/03/15 1000  . tamsulosin (FLOMAX) capsule 0.4 mg  0.4 mg Oral Daily Ozella Rocks, MD   0.4 mg at 12/03/15 1100  . temazepam (RESTORIL) capsule 15 mg  15 mg Oral QHS PRN Ozella Rocks, MD   15 mg at 12/02/15 2228  . thiamine (VITAMIN B-1) tablet 100 mg  100 mg Oral Daily Orpah Cobb, MD   100 mg at 12/03/15 1100     Discharge Medications: Please see discharge summary for a list of discharge medications.  Relevant Imaging Results:  Relevant Lab Results:   Additional Information    Tonie Elsey, Ervin Knack, LCSWA

## 2015-12-03 NOTE — Discharge Summary (Signed)
Physician Discharge Summary  Cody [name] ZOX:[id number] DOB: [birth year] DOA: 11/30/2015  PCP: No primary care provider on file.  Admit date: 11/30/2015 Discharge date: 12/03/2015  Time spent: 40 minutes  Recommendations for Outpatient Follow-up:  1. Follow-up with nursing home M.D. within 1 week. 2. Follow-up with cardiology [facility] office on 12/13/2015 at 9:30 AM for wound check.   Discharge Diagnoses:  Principal Problem:   Atrioventricular block, complete (HCC) Active Problems:   Syncope   BPH (benign prostatic hyperplasia)   Diabetes mellitus without complication (HCC)   Essential hypertension, benign   Symptomatic bradycardia   Nausea and vomiting   AKI (acute kidney injury) (HCC)   Discharge Condition: Stable  Diet recommendation: Heart healthy  Filed Weights   12/01/15 0400 12/02/15 0500 12/03/15 0400  Weight: 76 kg (167 lb 8.8 oz) 74.9 kg (165 lb 2 oz) 75.6 kg (166 lb 10.7 oz)    History of present illness:  Cody [name] is a 82 y.o. male   Level 5 caveat : Pt unable to participate fully in exam due to dementia and intractable vomiting throughout examination. Pt w/ poor verbal abilities History provided by the patient has very limited, most of the history obtained from EDP notes In talking to the patient all he is able to say is that he's been throwing up, and that he feel sick for 3 days.  Patient brought from nursing home facility by EMS. Patient currently had a syncopal episode after went from sitting to standing and began walking. This is not unusual for the patient. Patient was divided by EMS and was noted to have complete heart block. Patient has some decrease in his mental status with some confusion at time of event at the nursing home. While in the deep ED patient apparently had an episode of asystole and unresponsiveness that lasted 5 seconds and then resolved without intervention.  Hospital Course:   Complete heart block:  -Presented with  syncope likely secondary to third degree AV block. -Patient seen by cardiology, EP called and after discussion with the patient PPM will be placed. -Initially temporary venous pacemaker placed on 11/30/2015. -Permanent pacemaker placed on 12/01/2015. Patient monitored for 1 day after that and no symptoms.  Syncope:  -Patient does have syncope every now and then, increased lately.  -This is likely secondary to the third-degree AV block. -Hopefully this will resolved with the pacemaker placement. -Patient has Parkinson's and he might still have gait abnormality, will go back to nursing home, still fall risk.  AKI:  -Cr 1.27. Baseline 1.0. likely from GI loss and poor oral intake -Creatinine went up to 2.0, but went down to 1.36 with IV fluids, close to baseline of 1.27.  Nausea and vomiting: viral etiology vs DM gastroparesis. WBC nml. No reported change in stools and abd nonttp - CXR no evidence of pneumonia or aspiration. -This is treated symptomatically with antiemetics, patient did very well.  HTN: - hold all medications.  DM: - 1/2 dose levemir - SSI   Dementia: at baseline. Difficult to assertain if pt truly has capacity. NH does not have a working number for next of kin. Number listed in Epic for emergency contact does not work.  - continue restoril, Haldol  BPH:  Procedures:  Initial placement of temporary venous pacemaker done on 11/30/2015 by Dr. Algie Coffer.  Placement of PPM done on 12/01/2015 by Dr. Graciela Husbands  Consultations:  Cards  Discharge Exam: Filed Vitals:   12/02/15 1932 12/03/15 0400  BP: 167/94  158/69  Pulse: 65 66  Temp:  98.7 F (37.1 C)  Resp: 20 18   General: Alert and awake, oriented x3, not in any acute distress. HEENT: anicteric sclera, pupils reactive to light and accommodation, EOMI CVS: S1-S2 clear, no murmur rubs or gallops Chest: clear to auscultation bilaterally, no wheezing, rales or rhonchi Abdomen: soft nontender, nondistended,  normal bowel sounds, no organomegaly Extremities: no cyanosis, clubbing or edema noted bilaterally Neuro: Cranial nerves II-XII intact, no focal neurological deficits  Discharge Instructions   Discharge Instructions    Diet - low sodium heart healthy    Complete by:  As directed      Increase activity slowly    Complete by:  As directed           Current Discharge Medication List    CONTINUE these medications which have NOT CHANGED   Details  aspirin EC 81 MG tablet Take 81 mg by mouth daily.      Camphor-Eucalyptus-Menthol (MEDICATED CHEST RUB EX) Apply 1 application topically 2 (two) times daily.    escitalopram (LEXAPRO) 10 MG tablet Take 10 mg by mouth daily.    fluticasone (FLONASE) 50 MCG/ACT nasal spray Place into both nostrils daily.    haloperidol (HALDOL) 1 MG tablet Take 1 mg by mouth at bedtime.      hydrochlorothiazide (HYDRODIURIL) 25 MG tablet Take 1 tablet (25 mg total) by mouth daily. Qty: 30 tablet, Refills: 2    insulin detemir (LEVEMIR) 100 UNIT/ML injection Inject 0.4 mLs (40 Units total) into the skin at bedtime. Qty: 10 mL, Refills: 3    insulin lispro (HUMALOG) 100 UNIT/ML injection Inject 15-21 Units into the skin 3 (three) times daily before meals.    linagliptin (TRADJENTA) 5 MG TABS tablet Take 5 mg by mouth daily.    loratadine (CLARITIN) 10 MG tablet Take 10 mg by mouth daily.    Melatonin 5 MG CAPS Take 10 mg by mouth at bedtime.    Tamsulosin HCl (FLOMAX) 0.4 MG CAPS Take 0.4 mg by mouth daily.      temazepam (RESTORIL) 15 MG capsule Take 15 mg by mouth at bedtime as needed for sleep.    Thiamine HCl (VITAMIN B-1) 100 MG tablet Take 100 mg by mouth daily.         Allergies  Allergen Reactions  . Sulfa Antibiotics    Follow-up Information    Follow up with [facility] On 12/13/2015.   Specialty:  Cardiology   Why:  at 9:30AM for wound check    Contact information:   [address] [place] [address] [phone]      Follow up with Sherryl Manges, MD On 03/12/2016.   Specialty:  Cardiology   Why:  at [facility]   Contact information:   [address] [place] [address] [phone]        The results of significant diagnostics from this hospitalization (including imaging, microbiology, ancillary and laboratory) are listed below for reference.    Significant Diagnostic Studies: Dg Chest Port 1 View  12/02/2015  CLINICAL DATA:  Cardiac device in situ. EXAM: PORTABLE CHEST 1 VIEW COMPARISON:  11/30/2015 FINDINGS: Cardiac pacemaker. No pneumothorax. Shallow inspiration with elevation of right hemidiaphragm. Atelectasis in the right lung base. Small right pleural effusion layering along the lateral aspect, possibly loculated. Mild cardiac enlargement with central perihilar infiltration possibly due to edema. IMPRESSION: Cardiac enlargement with mild perihilar edema. Atelectasis in  the right lung base with small right pleural effusion. Electronically Signed   By: Burman Nieves M.D.   On: 12/02/2015 04:57   Dg Chest Port 1 View  11/30/2015  CLINICAL DATA:  Syncopal episode EXAM: PORTABLE CHEST 1 VIEW COMPARISON:  07/16/2015 FINDINGS: Cardiac shadow is stable. The lungs are well aerated bilaterally. Increased density along the lateral chest wall on the right is noted consistent with effusion. This may be somewhat loculated given its appearance. No focal infiltrate is seen. IMPRESSION: Apparent loculated effusion laterally on the right. Electronically Signed   By: Alcide Clever M.D.   On: 11/30/2015 20:07    Microbiology: Recent Results (from the past 240 hour(s))  MRSA PCR Screening     Status: Abnormal   Collection Time: 11/30/15 10:32 PM  Result Value Ref Range Status   MRSA by PCR POSITIVE (A) NEGATIVE Final    Comment:        The GeneXpert MRSA Assay (FDA approved for NASAL specimens only), is one component of a comprehensive MRSA  colonization surveillance program. It is not intended to diagnose MRSA infection nor to guide or monitor treatment for MRSA infections. RESULT CALLED TO, READ BACK BY AND VERIFIED WITH: N SNOW  12/01/15 MKELLY      Labs: Basic Metabolic Panel:  Recent Labs Lab 11/30/15 1324 11/30/15 2325 12/01/15 0205 12/01/15 0549 12/02/15 0705 12/03/15 0255  NA 140 142  --  140 143 142  K 4.5 4.1  --  4.6 3.9 3.7  CL 102 101  --  106 108 109  CO2 27 16*  --  21* 24 25  GLUCOSE 420* 492*  --  349* 100* 172*  BUN 36* 38*  --  43* 55* 47*  CREATININE 1.27* 2.07* 2.00* 2.07* 1.73* 1.36*  CALCIUM 8.9 8.7*  --  8.5* 7.9* 8.0*   Liver Function Tests:  Recent Labs Lab 11/30/15 2325  AST 77*  ALT 85*  ALKPHOS 84  BILITOT 1.4*  PROT 6.4*  ALBUMIN 3.2*   No results for input(s): LIPASE, AMYLASE in the last 168 hours. No results for input(s): AMMONIA in the last 168 hours. CBC:  Recent Labs Lab 11/30/15 1324 11/30/15 2325 12/01/15 0205 12/01/15 0549 12/03/15 0255  WBC 8.9 14.6* 16.6* 13.5* 10.8*  NEUTROABS 5.8  --   --   --   --   HGB 11.7* 12.2* 12.1* 11.8* 11.6*  HCT 36.1* 38.7* 39.0 36.9* 35.5*  MCV 88.5 90.2 89.0 86.8 89.2  PLT 159 147* 155 137* 117*   Cardiac Enzymes:  Recent Labs Lab 11/30/15 1324 12/01/15 1037  TROPONINI 0.04* 0.89*   BNP: BNP (last 3 results) No results for input(s): BNP in the last 8760 hours.  ProBNP (last 3 results) No results for input(s): PROBNP in the last 8760 hours.  CBG:  Recent Labs Lab 12/02/15 1129 12/02/15 1726 12/02/15 2143 12/03/15 0621 12/03/15 1116  GLUCAP 87 190* 188* 125* 163*       Signed:  Britini Garcilazo A MD.  [facility] 12/03/2015, 11:59 AM

## 2015-12-03 NOTE — Clinical Social Work Note (Addendum)
CSW contacted [facility] in [place] and they said they can not take patient back today because their pharmacy is closed on the weekend and can not order meds.  [facility] said they can take patient on Monday Morning.  CSW to continue to follow patient's progress throughout discharge planning.  CSW notified physician, case manager, and bedside nurse.  CSW faxed FL2 and discharge summary to ALF.   Cody [name]. Amunique Neyra, MSW, Theresia Majors [phone] 12/03/2015 3:03 PM

## 2015-12-04 LAB — GLUCOSE, CAPILLARY
GLUCOSE-CAPILLARY: 142 mg/dL — AB (ref 65–99)
GLUCOSE-CAPILLARY: 213 mg/dL — AB (ref 65–99)

## 2015-12-04 LAB — BASIC METABOLIC PANEL WITH GFR
Anion gap: 8 (ref 5–15)
BUN: 37 mg/dL — ABNORMAL HIGH (ref 6–20)
CO2: 28 mmol/L (ref 22–32)
Calcium: 8.2 mg/dL — ABNORMAL LOW (ref 8.9–10.3)
Chloride: 108 mmol/L (ref 101–111)
Creatinine, Ser: 1.15 mg/dL (ref 0.61–1.24)
GFR calc Af Amer: >60 mL/min (ref >60)
GFR calc non Af Amer: 58 mL/min — ABNORMAL LOW (ref >60)
Glucose, Bld: 174 mg/dL — ABNORMAL HIGH (ref 65–99)
Potassium: 3.7 mmol/L (ref 3.5–5.1)
Sodium: 144 mmol/L (ref 135–145)

## 2015-12-04 NOTE — Progress Notes (Signed)
Physical Therapy Treatment Patient Details Name: Cody [name] DOB: December 04, 1932 Today's Date: 12/04/2015    History of Present Illness Cody [name] is a 82 y.o. male with PMHx of advanced dementia, Parkinson's disease, legally blind with poor functional capacity requires assistance with daily ADL's, lives in a SNF. He has known history of bradycardia with heart block in the past, in discussion with Dr. Wyline Mood, stable escape rhythm noted historically in the 40's with good BP, and given his significant comorbidities and functional decline had avoided a PPM implant. He was brought to [facility] yesterday after a syncopal event at the SNF, possibly a fall. He was evaluated in the ED had CHB with good BP and without symptom at that time, with suspect dehydration was contributing to his event, though afterwards suffered an event of asystole associated with LOC, and he was transferred to [facility] for further care and management. Once here, he was observed to have ongoing CHB with rates into the 20's and more asystolic events and temporary pacing wire/pacing was done by Dr. Algie Coffer overnight. His BP has been stable   Pacemaker placed 12/01/15.     PT Comments    Pt performed 20 ft with RW and min-mod assistance.  Pt requiring decreased assist but remains to require max cues for safety and technique.  Pt set for d/c back to SNF today.    Follow Up Recommendations  SNF;Supervision/Assistance - 24 hour     Equipment Recommendations  None recommended by PT    Recommendations for Other Services       Precautions / Restrictions Precautions Precautions: Fall;ICD/Pacemaker Precaution Comments: pt is legallly blind    Mobility  Bed Mobility Overal bed mobility: Needs Assistance Bed Mobility: Supine to Sit     Supine to sit: Min assist     General bed mobility comments: Required decreased assist but remains to require tactile guidance to move from supine to edge of bed.     Transfers Overall transfer level: Needs assistance Equipment used: Rolling walker (2 wheeled) Transfers: Systems analyst;Sit to/from Stand (performed sit to stand to stedy with min-mod assist.  Pt transferring to chair with stedy then pt performed sit to stand from recliner with mod assist +1 and RW.  ) Sit to Stand: Min assist;Mod assist Stand pivot transfers: Min assist;Mod assist       General transfer comment: Performed bed to chair transfer with stedy and min-mod assist.  Performed sit to stand from recliner with mod assist +1.    Ambulation/Gait Ambulation/Gait assistance: Min assist;Mod assist Ambulation Distance (Feet): 20 Feet Assistive device: Rolling walker (2 wheeled) Gait Pattern/deviations: Step-through pattern;Decreased stride length;Shuffle     General Gait Details: Pt performed ambulation with in room required min assist for forward ambulation and mod assist for turning and backing with RW.     Stairs            Wheelchair Mobility    Modified Rankin (Stroke Patients Only)       Balance     Sitting balance-Leahy Scale: Fair       Standing balance-Leahy Scale: Poor                      Cognition Arousal/Alertness: Awake/alert Behavior During Therapy: Flat affect Overall Cognitive Status: History of cognitive impairments - at baseline                      Exercises  General Comments        Pertinent Vitals/Pain Pain Assessment: No/denies pain    Home Living                      Prior Function            PT Goals (current goals can now be found in the care plan section) Acute Rehab PT Goals Patient Stated Goal: to go back to SNF Potential to Achieve Goals: Good Progress towards PT goals: Progressing toward goals    Frequency  Min 3X/week    PT Plan      Co-evaluation             End of Session Equipment Utilized During Treatment: Gait belt Activity Tolerance: Patient tolerated  treatment well Patient left: in chair;with call bell/phone within reach;with chair alarm set     Time: 1610-9604 PT Time Calculation (min) (ACUTE ONLY): 19 min  Charges:  $Therapeutic Activity: 8-22 mins                    G Codes:      Cody [name] 12-21-15, 9:38 AM  Joycelyn Rua, PTA pager [phone]

## 2015-12-04 NOTE — Progress Notes (Signed)
[facility] PROGRESS NOTE   ABED SCHAR WUJ:[id number] DOB: 06/13/1933 DOA: 11/30/2015 PCP: No primary care provider on file.  HPI/Subjective: Denies any new complaints, feels okay.  Assessment/Plan: Principal Problem:   Atrioventricular block, complete (HCC) Active Problems:   Syncope   BPH (benign prostatic hyperplasia)   Diabetes mellitus without complication (HCC)   Essential hypertension, benign   Symptomatic bradycardia   Nausea and vomiting   AKI (acute kidney injury) (HCC)   Asystole/complete heart block:  -Presented with syncope likely secondary to third degree AV block. -Patient seen by cardiology, EP called and after discussion with the patient PPM will be placed. -PPM placed, transfer back to nursing home today.  Syncope:  -Patient does have syncope every now and then, increased lately.  -This is likely secondary to the third-degree AV block. -Hopefully this will resolved with the pacemaker placement. -Patient also has Parkinson's which is might be related to his syncopal episodes.  Nausea and vomiting: viral etiology vs DM gastroparesis. WBC nml. No reported change in stools and abd nonttp - Reglan - No zofran of phenergan - IVF - CXR no evidence of pneumonia or aspiration.  AKI: Cr 1.27. Baseline 1.0. likely from GI loss and poor oral intake -This is resolved, creatinine today is 1.1.  HTN: - hold all medications.  DM: - 1/2 dose levemir - SSI   Dementia:  Stays in assisted living facility  BPH:  Code Status: Full Code Family Communication: Plan discussed with the patient. Disposition Plan: Remains inpatient Diet: DIET DYS 3 Room service appropriate?: Yes; Fluid consistency:: Thin Diet - low sodium heart healthy  Consultants: EP cardiology   Procedures:   pacemaker placement pending.   Antibiotics:  None   Objective: Filed Vitals:   12/04/15 0400 12/04/15 0928  BP: 139/55   Pulse: 66 80  Temp: 98.2 F (36.8 C)     Resp: 18     Intake/Output Summary (Last 24 hours) at 12/04/15 1044 Last data filed at 12/04/15 0730  Gross per 24 hour  Intake   1200 ml  Output    400 ml  Net    800 ml   Filed Weights   12/02/15 0500 12/03/15 0400 12/04/15 0400  Weight: 74.9 kg (165 lb 2 oz) 75.6 kg (166 lb 10.7 oz) 74.9 kg (165 lb 2 oz)    Exam: General: Alert and awake, oriented x3, not in any acute distress. HEENT: anicteric sclera, pupils reactive to light and accommodation, EOMI CVS: S1-S2 clear, no murmur rubs or gallops Chest: clear to auscultation bilaterally, no wheezing, rales or rhonchi Abdomen: soft nontender, nondistended, normal bowel sounds, no organomegaly Extremities: no cyanosis, clubbing or edema noted bilaterally Neuro: Cranial nerves II-XII intact, no focal neurological deficits  Data Reviewed: Basic Metabolic Panel:  Recent Labs Lab 11/30/15 2325 12/01/15 0205 12/01/15 0549 12/02/15 0705 12/03/15 0255 12/04/15 0430  NA 142  --  140 143 142 144  K 4.1  --  4.6 3.9 3.7 3.7  CL 101  --  106 108 109 108  CO2 16*  --  21* GLUCOSE 492*  --  349* 100* 172* 174*  BUN 38*  --  43* 55* 47* 37*  CREATININE 2.07* 2.00* 2.07* 1.73* 1.36* 1.15  CALCIUM 8.7*  --  8.5* 7.9* 8.0* 8.2*   Liver Function Tests:  Recent Labs Lab 11/30/15 2325  AST 77*  ALT 85*  ALKPHOS 84  BILITOT 1.4*  PROT 6.4*  ALBUMIN 3.2*  No results for input(s): LIPASE, AMYLASE in the last 168 hours. No results for input(s): AMMONIA in the last 168 hours. CBC:  Recent Labs Lab 11/30/15 1324 11/30/15 2325 12/01/15 0205 12/01/15 0549 12/03/15 0255  WBC 8.9 14.6* 16.6* 13.5* 10.8*  NEUTROABS 5.8  --   --   --   --   HGB 11.7* 12.2* 12.1* 11.8* 11.6*  HCT 36.1* 38.7* 39.0 36.9* 35.5*  MCV 88.5 90.2 89.0 86.8 89.2  PLT 159 147* 155 137* 117*   Cardiac Enzymes:  Recent Labs Lab 11/30/15 1324 12/01/15 1037  TROPONINI 0.04* 0.89*   BNP (last 3 results) No results for input(s): BNP in  the last 8760 hours.  ProBNP (last 3 results) No results for input(s): PROBNP in the last 8760 hours.  CBG:  Recent Labs Lab 12/03/15 1116 12/03/15 1214 12/03/15 1620 12/03/15 2130 12/04/15 0538  GLUCAP 163* 154* 257* 243* 142*    Micro Recent Results (from the past 240 hour(s))  MRSA PCR Screening     Status: Abnormal   Collection Time: 11/30/15 10:32 PM  Result Value Ref Range Status   MRSA by PCR POSITIVE (A) NEGATIVE Final    Comment:        The GeneXpert MRSA Assay (FDA approved for NASAL specimens only), is one component of a comprehensive MRSA colonization surveillance program. It is not intended to diagnose MRSA infection nor to guide or monitor treatment for MRSA infections. RESULT CALLED TO, READ BACK BY AND VERIFIED WITH: N SNOW  12/01/15 MKELLY      Studies: No results found.  Scheduled Meds: . antiseptic oral rinse  7 mL Mouth Rinse BID  . aspirin  81 mg Oral Daily  . carvedilol  3.125 mg Oral BID WC  . Chlorhexidine Gluconate Cloth  6 each Topical Q0600  . haloperidol  1 mg Oral QHS  . heparin  5,000 Units Subcutaneous 3 times per day  . hydrochlorothiazide  25 mg Oral Daily  . insulin aspart  0-20 Units Subcutaneous TID WC  . insulin aspart  0-5 Units Subcutaneous QHS  . insulin detemir  20 Units Subcutaneous QHS  . mupirocin ointment  1 application Nasal BID  . sodium chloride flush  3 mL Intravenous Q12H  . tamsulosin  0.4 mg Oral Daily  . thiamine  100 mg Oral Daily   Continuous Infusions:       Time spent: 35 minutes    [facility]  [facility] Pager [phone] If 7PM-7AM, please contact night-coverage at [email or web address], password [facility] 12/04/2015, 10:44 AM  LOS: 4 days

## 2015-12-04 NOTE — Progress Notes (Signed)
Called [facility] and gave report. All questions answered.  RN had gotten information from CSW earlier when paperwork was faxed. Transportation from facility picked up patient, paperwork, and belongings.   Thomas Hoff, RN

## 2015-12-04 NOTE — Care Management Note (Signed)
Case Management Note Previous CM note initiated by Dorcas Carrow RN, CN  Patient Details  Name: Cody [name] MRN: [id number] Date of Birth: April 25, 1933  Subjective/Objective:         Adm w bradycardia           Action/Plan: from nsg facility, sw ref made   Expected Discharge Date:    12/04/15              Expected Discharge Plan:  Assisted Living / Rest Home  In-House Referral:  Clinical Social Work  Discharge planning Services  CM Consult  Post Acute Care Choice:  Home Health Choice offered to:  NA  DME Arranged:    DME Agency:     HH Arranged:  PT HH Agency:  [facility]  Status of Service:  Completed, signed off  Medicare Important Message Given:  Yes Date Medicare IM Given:    Medicare IM give by:    Date Additional Medicare IM Given:    Additional Medicare Important Message give by:     If discussed at Long Length of Stay Meetings, dates discussed:    Discharge Disposition: Assisted LIving with [facility]   Additional Comments: ur review done  12/04/15- 1300- Donn Pierini RN, BSN- notified by CSW that pt needed HH f/u at ALF- per [facility] they contract with [facility], no order found for [facility]- paged discharging MD to ask for HHPT order- call made to Abby with [facility]- was told that pt was already active with them for PT - prior to admission- she will f/u on resumption of HH services at facility.   Darrold Span, RN 12/04/2015, 2:36 PM

## 2015-12-04 NOTE — Care Management Important Message (Signed)
Important Message  Patient Details  Name: Cody [name] MRN: [id number] Date of Birth: April 21, 1933   Medicare Important Message Given:  Yes    Darrold Span, RN 12/04/2015, 11:57 AM

## 2015-12-04 NOTE — Progress Notes (Addendum)
[facility] will [facility] to: [facility] Anticipated DC date: 12/04/15 Family notified: Patient declined to call family and stated he didn't have any. Transport by: [facility] come [facility] up  CSW signing off.  Cristobal Goldmann, [place] Clinical Social Worker [phone]

## 2015-12-04 NOTE — NC FL2 (Signed)
[place] MEDICAID FL2 LEVEL OF CARE SCREENING TOOL     IDENTIFICATION  Patient Name: Cody [name] Birthdate: [birth year] Sex: male Admission Date (Current Location): 11/30/2015  [facility] and [place] Number:  [employer] and Address:  [facility], [address], [place], [place] [address]      Provider Number: [id number]  Attending Physician Name and Address:  Clydia Llano, MD  Relative Name and Phone Number:  Richardean Chimera Relative [phone]    Current Level of Care: Hospital Recommended Level of Care: Assisted Living Facility Prior Approval Number:    Date Approved/Denied:   PASRR Number:    Discharge Plan: Other (Comment) [facility])    Current Diagnoses: Patient Active Problem List   Diagnosis Date Noted  . Symptomatic bradycardia 11/30/2015  . Atrioventricular block, complete (HCC) 11/30/2015  . Nausea and vomiting 11/30/2015  . AKI (acute kidney injury) (HCC) 11/30/2015  . Complete heart block (HCC)   . Faintness   . Essential hypertension, benign 09/28/2015  . Sepsis (HCC) 12/06/2014  . Influenza, pneumonia 12/06/2014  . Syncope 12/05/2014  . BPH (benign prostatic hyperplasia) 12/05/2014  . Dementia 12/05/2014  . Diabetes mellitus without complication (HCC) 12/05/2014    Orientation RESPIRATION BLADDER Height & Weight     Place, Situation, Self  Normal Continent Weight: 165 lb 2 oz (74.9 kg) Height:   (170.2 cm) (estimated)  BEHAVIORAL SYMPTOMS/MOOD NEUROLOGICAL BOWEL NUTRITION STATUS      Continent Diet (Carb Modified)  AMBULATORY STATUS COMMUNICATION OF NEEDS Skin   Supervision Verbally Surgical wounds                       Personal Care Assistance Level of Assistance  Bathing Bathing Assistance: Limited assistance         Functional Limitations Info             SPECIAL CARE FACTORS FREQUENCY  PT (By licensed PT)     PT Frequency: 3x a day               Contractures      Additional Factors Info  Allergies   Allergies Info: SULFA ANTIBIOTICS           Current Medications (12/04/2015):    Discharge Medications:   CONTINUE these medications which have NOT CHANGED   Details  aspirin EC 81 MG tablet Take 81 mg by mouth daily.     Camphor-Eucalyptus-Menthol (MEDICATED CHEST RUB EX) Apply 1 application topically 2 (two) times daily.    escitalopram (LEXAPRO) 10 MG tablet Take 10 mg by mouth daily.    fluticasone (FLONASE) 50 MCG/ACT nasal spray Place into both nostrils daily.    haloperidol (HALDOL) 1 MG tablet Take 1 mg by mouth at bedtime.     hydrochlorothiazide (HYDRODIURIL) 25 MG tablet Take 1 tablet (25 mg total) by mouth daily. Qty: 30 tablet, Refills: 2    insulin detemir (LEVEMIR) 100 UNIT/ML injection Inject 0.4 mLs (40 Units total) into the skin at bedtime. Qty: 10 mL, Refills: 3    insulin lispro (HUMALOG) 100 UNIT/ML injection Inject 15-21 Units into the skin 3 (three) times daily before meals.    linagliptin (TRADJENTA) 5 MG TABS tablet Take 5 mg by mouth daily.    loratadine (CLARITIN) 10 MG tablet Take 10 mg by mouth daily.    Melatonin 5 MG CAPS Take 10 mg by mouth at bedtime.  Tamsulosin HCl (FLOMAX) 0.4 MG CAPS Take 0.4 mg by mouth daily.     temazepam (RESTORIL) 15 MG capsule Take 15 mg by mouth at bedtime as needed for sleep.    Thiamine HCl (VITAMIN B-1) 100 MG tablet Take 100 mg by mouth daily.          Relevant Imaging Results:  Relevant Lab Results:   Additional Information    Mearl Latin, LCSWA

## 2015-12-07 ENCOUNTER — Emergency Department (HOSPITAL_COMMUNITY): Payer: Medicare Other

## 2015-12-07 ENCOUNTER — Inpatient Hospital Stay (HOSPITAL_COMMUNITY)
Admission: EM | Admit: 2015-12-07 | Discharge: 2015-12-09 | DRG: 292 | Disposition: A | Discharge status: Skilled Nursing Facility | Payer: Medicare Other | Attending: Internal Medicine | Admitting: Internal Medicine

## 2015-12-07 ENCOUNTER — Encounter (HOSPITAL_COMMUNITY): Payer: Self-pay

## 2015-12-07 DIAGNOSIS — I110 Hypertensive heart disease with heart failure: Secondary | ICD-10-CM | POA: Diagnosis not present

## 2015-12-07 DIAGNOSIS — I10 Essential (primary) hypertension: Secondary | ICD-10-CM | POA: Diagnosis present

## 2015-12-07 DIAGNOSIS — Z87891 Personal history of nicotine dependence: Secondary | ICD-10-CM

## 2015-12-07 DIAGNOSIS — F0390 Unspecified dementia without behavioral disturbance: Secondary | ICD-10-CM | POA: Diagnosis present

## 2015-12-07 DIAGNOSIS — D649 Anemia, unspecified: Secondary | ICD-10-CM | POA: Insufficient documentation

## 2015-12-07 DIAGNOSIS — I5043 Acute on chronic combined systolic (congestive) and diastolic (congestive) heart failure: Secondary | ICD-10-CM | POA: Diagnosis present

## 2015-12-07 DIAGNOSIS — Z794 Long term (current) use of insulin: Secondary | ICD-10-CM

## 2015-12-07 DIAGNOSIS — I5023 Acute on chronic systolic (congestive) heart failure: Secondary | ICD-10-CM

## 2015-12-07 DIAGNOSIS — R0600 Dyspnea, unspecified: Secondary | ICD-10-CM | POA: Diagnosis not present

## 2015-12-07 DIAGNOSIS — IMO0001 Reserved for inherently not codable concepts without codable children: Secondary | ICD-10-CM

## 2015-12-07 DIAGNOSIS — Z7982 Long term (current) use of aspirin: Secondary | ICD-10-CM

## 2015-12-07 DIAGNOSIS — G20 Parkinson's disease: Secondary | ICD-10-CM | POA: Diagnosis present

## 2015-12-07 DIAGNOSIS — F0281 Dementia in other diseases classified elsewhere with behavioral disturbance: Secondary | ICD-10-CM | POA: Diagnosis present

## 2015-12-07 DIAGNOSIS — E119 Type 2 diabetes mellitus without complications: Secondary | ICD-10-CM | POA: Diagnosis present

## 2015-12-07 LAB — CBC WITH DIFFERENTIAL/PLATELET
BASOS ABS: 0 10*3/uL (ref 0.0–0.1)
BASOS PCT: 0 %
EOS PCT: 3 %
Eosinophils Absolute: 0.3 10*3/uL (ref 0.0–0.7)
HEMATOCRIT: 35.8 % — AB (ref 39.0–52.0)
Hemoglobin: 12 g/dL — ABNORMAL LOW (ref 13.0–17.0)
Lymphocytes Relative: 21 %
Lymphs Abs: 2 10*3/uL (ref 0.7–4.0)
MCH: 29.6 pg (ref 26.0–34.0)
MCHC: 33.5 g/dL (ref 30.0–36.0)
MCV: 88.2 fL (ref 78.0–100.0)
MONO ABS: 1.2 10*3/uL — AB (ref 0.1–1.0)
MONOS PCT: 13 %
NEUTROS ABS: 6 10*3/uL (ref 1.7–7.7)
Neutrophils Relative %: 63 %
PLATELETS: 155 10*3/uL (ref 150–400)
RBC: 4.06 MIL/uL — ABNORMAL LOW (ref 4.22–5.81)
RDW: 13.9 % (ref 11.5–15.5)
WBC: 9.3 10*3/uL (ref 4.0–10.5)

## 2015-12-07 LAB — COMPREHENSIVE METABOLIC PANEL
ALBUMIN: 3.1 g/dL — AB (ref 3.5–5.0)
ALT: 110 U/L — ABNORMAL HIGH (ref 17–63)
ANION GAP: 7 (ref 5–15)
AST: 31 U/L (ref 15–41)
Alkaline Phosphatase: 76 U/L (ref 38–126)
BUN: 30 mg/dL — ABNORMAL HIGH (ref 6–20)
CHLORIDE: 100 mmol/L — AB (ref 101–111)
CO2: 30 mmol/L (ref 22–32)
Calcium: 8.4 mg/dL — ABNORMAL LOW (ref 8.9–10.3)
Creatinine, Ser: 1.08 mg/dL (ref 0.61–1.24)
GFR calc Af Amer: >60 mL/min (ref >60)
Glucose, Bld: 194 mg/dL — ABNORMAL HIGH (ref 65–99)
POTASSIUM: 3.7 mmol/L (ref 3.5–5.1)
Sodium: 137 mmol/L (ref 135–145)
TOTAL PROTEIN: 6.3 g/dL — AB (ref 6.5–8.1)
Total Bilirubin: 0.8 mg/dL (ref 0.3–1.2)

## 2015-12-07 LAB — BRAIN NATRIURETIC PEPTIDE: B Natriuretic Peptide: 1063 pg/mL — ABNORMAL HIGH (ref 0.0–100.0)

## 2015-12-07 LAB — TROPONIN I: Troponin I: 0.06 ng/mL — ABNORMAL HIGH (ref <0.031)

## 2015-12-07 MED ORDER — FUROSEMIDE 10 MG/ML IJ SOLN
20.0000 mg | Freq: Once | INTRAMUSCULAR | Status: AC
  Administered 2015-12-07: 20 mg via INTRAVENOUS
  Filled 2015-12-07: qty 2

## 2015-12-07 NOTE — ED Provider Notes (Signed)
CSN: [id number]     Arrival date & time 12/07/15  2128 History  By signing my name below, I, Luqman Perrelli, attest that this documentation has been prepared under the direction and in the presence of Shon Baton, MD . Electronically Signed: Linna Darner, Scribe. 12/07/2015. 11:05 PM.    Chief Complaint  Patient presents with  . Shortness of Breath    The history is provided by the patient. No language interpreter was used.     HPI Comments: Cody [name] is a 82 y.o. male with h/o dementia who presents to the Emergency Department complaining of sudden onset SOB beginning earlier tonight. Pt endorses that he has increased difficulty breathing while laying flat or ambulating. Pt had similar symptoms in the past due to his heart problems; he has a pacemaker that was placed recently. He denies any present issues besides SOB. Pt denies cough, fever, leg swelling, chest pain, or any other associated symptoms at this time. He lives at [facility].  Per chart review, patient had a pacemaker placed on 12/01/15. Pacemaker was placed by Dr. Johney Frame. Otherwise followed by Dr. Algie Coffer.  Baseline EF of 45%.  Past Medical History  Diagnosis Date  . Essential hypertension, benign   . Type 2 diabetes mellitus (HCC)   . Dementia   . Parkinson's disease   . Insomnia   . Benign prostate hyperplasia    Past Surgical History  Procedure Laterality Date  . Cardiac catheterization N/A 11/30/2015    Procedure: Temporary Pacemaker;  Surgeon: Orpah Cobb, MD;  Location: [facility] INVASIVE CV LAB;  Service: Cardiovascular;  Laterality: N/A;  . Ep implantable device N/A 12/01/2015    Procedure: Pacemaker Implant;  Surgeon: Duke Salvia, MD;  Location: [facility] INVASIVE CV LAB;  Service: Cardiovascular;  Laterality: N/A;   Family History  Problem Relation Age of Onset  . Family history unknown: Yes   Social History  Substance Use Topics  . Smoking status: Former Smoker -- 0.50 packs/day for 4 years    Types:  Cigarettes  . Smokeless tobacco: Never Used  . Alcohol Use: No    Review of Systems  Constitutional: Negative for fever.  Respiratory: Positive for shortness of breath. Negative for cough.   Cardiovascular: Negative for chest pain and leg swelling.  Gastrointestinal: Negative for vomiting and abdominal pain.  All other systems reviewed and are negative.     Allergies  Sulfa antibiotics  Home Medications   Prior to Admission medications   Medication Sig Start Date End Date Taking? Authorizing Provider  aspirin EC 81 MG tablet Take 81 mg by mouth daily.     Yes Historical Provider, MD  Camphor-Eucalyptus-Menthol (MEDICATED CHEST RUB EX) Apply 1 application topically 2 (two) times daily.   Yes Historical Provider, MD  clotrimazole-betamethasone (LOTRISONE) cream Apply 1 application topically 2 (two) times daily as needed (Applied to scrotum area in a small amount as needed for rash).   Yes Historical Provider, MD  escitalopram (LEXAPRO) 10 MG tablet Take 10 mg by mouth daily.   Yes Historical Provider, MD  fluticasone (FLONASE) 50 MCG/ACT nasal spray Place 1 spray into both nostrils daily.    Yes Historical Provider, MD  haloperidol (HALDOL) 1 MG tablet Take 1 mg by mouth at bedtime.     Yes Historical Provider, MD  hydrOXYzine (ATARAX/VISTARIL) 25 MG tablet Take 25 mg by mouth every 6 (six) hours as needed.   Yes Historical Provider, MD  insulin detemir (LEVEMIR) 100 UNIT/ML injection Inject 0.4  mLs (40 Units total) into the skin at bedtime. 09/28/15  Yes Roma Kayser, MD  insulin lispro (HUMALOG) 100 UNIT/ML injection Inject 15 Units into the skin 3 (three) times daily before meals. Give if blood sugars are over 150   Yes Historical Provider, MD  linagliptin (TRADJENTA) 5 MG TABS tablet Take 5 mg by mouth daily.   Yes Historical Provider, MD  loratadine (CLARITIN) 10 MG tablet Take 10 mg by mouth daily.   Yes Historical Provider, MD  Melatonin 5 MG CAPS Take 10 mg by mouth at  bedtime.   Yes Historical Provider, MD  Tamsulosin HCl (FLOMAX) 0.4 MG CAPS Take 0.4 mg by mouth daily.     Yes Historical Provider, MD  temazepam (RESTORIL) 15 MG capsule Take 15 mg by mouth at bedtime as needed for sleep.   Yes Historical Provider, MD  Thiamine HCl (VITAMIN B-1) 100 MG tablet Take 100 mg by mouth daily.     Yes Historical Provider, MD   BP 172/88 mmHg  Pulse 69  Temp(Src) 98.9 F (37.2 C) (Oral)  Resp 29  SpO2 95% Physical Exam  Constitutional: He is oriented to person, place, and time. No distress.  Elderly  HENT:  Head: Normocephalic.  Mouth/Throat: Oropharynx is clear and moist.  Eyes: Pupils are equal, round, and reactive to light.  Missing left eye  Neck: Neck supple.  Cardiovascular: Normal rate, regular rhythm and normal heart sounds.   No murmur heard. Pulmonary/Chest: Effort normal and breath sounds normal. No respiratory distress. He has no wheezes.  Coarse breath sounds with crackles bilaterally, mild tachypnea, no acute distress  Abdominal: Soft. Bowel sounds are normal. There is no tenderness. There is no rebound.  Musculoskeletal: He exhibits edema.  Trace bilateral lower extremity edema  Neurological: He is alert and oriented to person, place, and time.  Skin: Skin is warm and dry.  Psychiatric: He has a normal mood and affect.  Nursing note and vitals reviewed.   ED Course  Procedures (including critical care time)  DIAGNOSTIC STUDIES: Oxygen Saturation is 95% on RA, adequate by my interpretation.    COORDINATION OF CARE: 11:05 PM Will administer Lasix injection 20 mg. Will order brain natriuretic peptide. Discussed treatment plan with pt at bedside and pt agreed to plan.   Labs Review Labs Reviewed  CBC WITH DIFFERENTIAL/PLATELET - Abnormal; Notable for the following:    RBC 4.06 (*)    Hemoglobin 12.0 (*)    HCT 35.8 (*)    Monocytes Absolute 1.2 (*)    All other components within normal limits  COMPREHENSIVE METABOLIC PANEL -  Abnormal; Notable for the following:    Chloride 100 (*)    Glucose, Bld 194 (*)    BUN 30 (*)    Calcium 8.4 (*)    Total Protein 6.3 (*)    Albumin 3.1 (*)    ALT 110 (*)    All other components within normal limits  TROPONIN I - Abnormal; Notable for the following:    Troponin I 0.06 (*)    All other components within normal limits  BRAIN NATRIURETIC PEPTIDE - Abnormal; Notable for the following:    B Natriuretic Peptide 1063.0 (*)    All other components within normal limits    Imaging Review Dg Chest Portable 1 View  12/07/2015  CLINICAL DATA:  Acute onset of worsening shortness of breath. Initial encounter. EXAM: PORTABLE CHEST 1 VIEW COMPARISON:  Chest radiograph performed 12/02/2015 FINDINGS: A somewhat loculated small right  pleural effusion is again noted, with associated opacity likely reflecting atelectasis. Underlying vascular congestion is seen, with increased interstitial markings, raising concern for mild interstitial edema. No pneumothorax is identified. The cardiomediastinal silhouette is borderline enlarged. A pacemaker is noted overlying the left chest wall, with leads ending overlying the right atrium and right ventricle. No acute osseous abnormalities are seen. IMPRESSION: 1. Vascular congestion and borderline cardiomegaly, with increased interstitial markings, raising concern for mild interstitial edema. 2. Somewhat loculated small right pleural effusion again noted, with associated opacity likely reflecting atelectasis. Electronically Signed   By: Roanna Raider M.D.   On: 12/07/2015 21:52   I have personally reviewed and evaluated these lab results as part of my medical decision-making.   EKG Interpretation   Date/Time:  Thursday December 07 2015 21:31:48 EST Ventricular Rate:  69 PR Interval:  214 QRS Duration: 164 QT Interval:  457 QTC Calculation: 490 R Axis:   -81 Text Interpretation:  Atrial-sensed ventricular-paced rhythm No further  analysis attempted  due to paced rhythm Confirmed by Lovett Coffin  MD, Toni Amend  ([id number]) on 12/07/2015 11:37:12 PM      MDM   Final diagnoses:  Acute on chronic systolic congestive heart failure (HCC)    Patient presents with shortness of breath. Describes orthopnea and dyspnea on exertion. Denies chest pain. Recent pacemaker placement for complete heart block and syncope. No acute respiratory distress.  Satting 94% on room air at rest. Chest x-ray shows evidence of vascular congestion and cardiomegaly concerning for interstitial edema. BNP is elevated greater than 1000.  Troponin is 0.06 down from 0.89. Patient is currently chest pain-free. Patient is not currently on any diuretic medication. He was given 20 mg of IV Lasix. Suspect mild volume overload. Given recent pacemaker placement, discussed with Dr. Shirlee Latch, cardiology.  Will admit for diuresis and monitoring at [facility]. Discussed with hospitalist, Dr. Antionette Char.  I personally performed the services described in this documentation, which was scribed in my presence. The recorded information has been reviewed and is accurate.     Shon Baton, MD 12/08/15 [phone]

## 2015-12-07 NOTE — ED Notes (Signed)
Pt is resident of [facility] with c/o sob onset today.  Pt with moderate resp diff on arrival.    Pt denies cp.  Pt is 1 week s/p pacemaker placement at [facility].

## 2015-12-08 ENCOUNTER — Encounter (HOSPITAL_COMMUNITY): Payer: Self-pay | Admitting: Family Medicine

## 2015-12-08 DIAGNOSIS — Z87891 Personal history of nicotine dependence: Secondary | ICD-10-CM | POA: Diagnosis not present

## 2015-12-08 DIAGNOSIS — I10 Essential (primary) hypertension: Secondary | ICD-10-CM | POA: Diagnosis not present

## 2015-12-08 DIAGNOSIS — F0281 Dementia in other diseases classified elsewhere with behavioral disturbance: Secondary | ICD-10-CM | POA: Diagnosis present

## 2015-12-08 DIAGNOSIS — I5043 Acute on chronic combined systolic (congestive) and diastolic (congestive) heart failure: Secondary | ICD-10-CM

## 2015-12-08 DIAGNOSIS — Z794 Long term (current) use of insulin: Secondary | ICD-10-CM | POA: Diagnosis not present

## 2015-12-08 DIAGNOSIS — D649 Anemia, unspecified: Secondary | ICD-10-CM | POA: Diagnosis present

## 2015-12-08 DIAGNOSIS — E119 Type 2 diabetes mellitus without complications: Secondary | ICD-10-CM | POA: Diagnosis present

## 2015-12-08 DIAGNOSIS — G20 Parkinson's disease: Secondary | ICD-10-CM | POA: Diagnosis present

## 2015-12-08 DIAGNOSIS — F0391 Unspecified dementia with behavioral disturbance: Secondary | ICD-10-CM | POA: Diagnosis not present

## 2015-12-08 DIAGNOSIS — I509 Heart failure, unspecified: Secondary | ICD-10-CM | POA: Insufficient documentation

## 2015-12-08 DIAGNOSIS — IMO0001 Reserved for inherently not codable concepts without codable children: Secondary | ICD-10-CM

## 2015-12-08 DIAGNOSIS — Z7982 Long term (current) use of aspirin: Secondary | ICD-10-CM | POA: Diagnosis not present

## 2015-12-08 DIAGNOSIS — I110 Hypertensive heart disease with heart failure: Secondary | ICD-10-CM | POA: Diagnosis present

## 2015-12-08 DIAGNOSIS — R0600 Dyspnea, unspecified: Secondary | ICD-10-CM | POA: Diagnosis present

## 2015-12-08 LAB — GLUCOSE, CAPILLARY
GLUCOSE-CAPILLARY: 47 mg/dL — AB (ref 65–99)
GLUCOSE-CAPILLARY: 94 mg/dL (ref 65–99)
GLUCOSE-CAPILLARY: 137 mg/dL — AB (ref 65–99)
Glucose-Capillary: 123 mg/dL — ABNORMAL HIGH (ref 65–99)
Glucose-Capillary: 152 mg/dL — ABNORMAL HIGH (ref 65–99)

## 2015-12-08 LAB — MRSA PCR SCREENING: MRSA by PCR: NEGATIVE

## 2015-12-08 MED ORDER — HALOPERIDOL 2 MG PO TABS
1.0000 mg | ORAL_TABLET | Freq: Every day | ORAL | Status: DC
  Administered 2015-12-08: 1 mg via ORAL
  Filled 2015-12-08: qty 1

## 2015-12-08 MED ORDER — ONDANSETRON HCL 4 MG/2ML IJ SOLN: 4.0000 mg | Freq: Four times a day (QID) | INTRAMUSCULAR | Status: DC | PRN

## 2015-12-08 MED ORDER — INSULIN ASPART 100 UNIT/ML ~~LOC~~ SOLN
6.0000 [IU] | Freq: Three times a day (TID) | SUBCUTANEOUS | Status: DC
Start: 2015-12-08 — End: 2015-12-08

## 2015-12-08 MED ORDER — FUROSEMIDE 10 MG/ML IJ SOLN
40.0000 mg | Freq: Two times a day (BID) | INTRAMUSCULAR | Status: DC
  Administered 2015-12-08 – 2015-12-09 (×2): 40 mg via INTRAVENOUS
  Filled 2015-12-08 (×2): qty 4

## 2015-12-08 MED ORDER — VITAMIN B-1 100 MG PO TABS
100.0000 mg | ORAL_TABLET | Freq: Every day | ORAL | Status: DC
  Administered 2015-12-08 – 2015-12-09 (×2): 100 mg via ORAL
  Filled 2015-12-08 (×2): qty 1

## 2015-12-08 MED ORDER — SODIUM CHLORIDE 0.9% FLUSH
3.0000 mL | Freq: Two times a day (BID) | INTRAVENOUS | Status: DC
  Administered 2015-12-08 – 2015-12-09 (×4): 3 mL via INTRAVENOUS

## 2015-12-08 MED ORDER — ASPIRIN EC 81 MG PO TBEC
81.0000 mg | DELAYED_RELEASE_TABLET | Freq: Every day | ORAL | Status: DC
  Administered 2015-12-08 – 2015-12-09 (×2): 81 mg via ORAL
  Filled 2015-12-08 (×2): qty 1

## 2015-12-08 MED ORDER — LISINOPRIL 5 MG PO TABS
2.5000 mg | ORAL_TABLET | Freq: Every day | ORAL | Status: DC
  Administered 2015-12-08 – 2015-12-09 (×2): 2.5 mg via ORAL
  Filled 2015-12-08 (×2): qty 1

## 2015-12-08 MED ORDER — SODIUM CHLORIDE 0.9% FLUSH: 3.0000 mL | INTRAVENOUS | Status: DC | PRN

## 2015-12-08 MED ORDER — INSULIN DETEMIR 100 UNIT/ML ~~LOC~~ SOLN
35.0000 [IU] | Freq: Every day | SUBCUTANEOUS | Status: DC
  Filled 2015-12-08 (×3): qty 0.35

## 2015-12-08 MED ORDER — MELATONIN 5 MG PO CAPS: 10.0000 mg | ORAL_CAPSULE | Freq: Every day | ORAL | Status: DC

## 2015-12-08 MED ORDER — TAMSULOSIN HCL 0.4 MG PO CAPS
0.4000 mg | ORAL_CAPSULE | Freq: Every day | ORAL | Status: DC
  Administered 2015-12-08 – 2015-12-09 (×2): 0.4 mg via ORAL
  Filled 2015-12-08 (×2): qty 1

## 2015-12-08 MED ORDER — INSULIN ASPART 100 UNIT/ML ~~LOC~~ SOLN
0.0000 [IU] | Freq: Three times a day (TID) | SUBCUTANEOUS | Status: DC
  Administered 2015-12-08 (×2): 2 [IU] via SUBCUTANEOUS
  Administered 2015-12-09: 3 [IU] via SUBCUTANEOUS
  Administered 2015-12-09: 8 [IU] via SUBCUTANEOUS

## 2015-12-08 MED ORDER — SODIUM CHLORIDE 0.9 % IV SOLN: 250.0000 mL | INTRAVENOUS | Status: DC | PRN

## 2015-12-08 MED ORDER — FLUTICASONE PROPIONATE 50 MCG/ACT NA SUSP
1.0000 | Freq: Every day | NASAL | Status: DC
  Administered 2015-12-08 – 2015-12-09 (×2): 1 via NASAL
  Filled 2015-12-08: qty 16

## 2015-12-08 MED ORDER — TEMAZEPAM 15 MG PO CAPS: 15.0000 mg | ORAL_CAPSULE | Freq: Every evening | ORAL | Status: DC | PRN

## 2015-12-08 MED ORDER — ENOXAPARIN SODIUM 40 MG/0.4ML ~~LOC~~ SOLN
40.0000 mg | SUBCUTANEOUS | Status: DC
  Administered 2015-12-08 – 2015-12-09 (×2): 40 mg via SUBCUTANEOUS
  Filled 2015-12-08 (×2): qty 0.4

## 2015-12-08 MED ORDER — ACETAMINOPHEN 325 MG PO TABS: 650.0000 mg | ORAL_TABLET | ORAL | Status: DC | PRN

## 2015-12-08 MED ORDER — HYDRALAZINE HCL 25 MG PO TABS
25.0000 mg | ORAL_TABLET | Freq: Three times a day (TID) | ORAL | Status: DC
  Administered 2015-12-08 – 2015-12-09 (×4): 25 mg via ORAL
  Filled 2015-12-08 (×4): qty 1

## 2015-12-08 MED ORDER — FUROSEMIDE 10 MG/ML IJ SOLN
40.0000 mg | Freq: Once | INTRAMUSCULAR | Status: AC
  Administered 2015-12-08: 40 mg via INTRAVENOUS
  Filled 2015-12-08: qty 4

## 2015-12-08 MED ORDER — ESCITALOPRAM OXALATE 10 MG PO TABS
10.0000 mg | ORAL_TABLET | Freq: Every day | ORAL | Status: DC
  Administered 2015-12-08 – 2015-12-09 (×2): 10 mg via ORAL
  Filled 2015-12-08 (×2): qty 1

## 2015-12-08 MED ORDER — LORATADINE 10 MG PO TABS
10.0000 mg | ORAL_TABLET | Freq: Every day | ORAL | Status: DC
  Administered 2015-12-08 – 2015-12-09 (×2): 10 mg via ORAL
  Filled 2015-12-08 (×2): qty 1

## 2015-12-08 MED ORDER — INSULIN DETEMIR 100 UNIT/ML ~~LOC~~ SOLN
20.0000 [IU] | Freq: Every day | SUBCUTANEOUS | Status: DC
  Administered 2015-12-09: 20 [IU] via SUBCUTANEOUS
  Filled 2015-12-08 (×3): qty 0.2

## 2015-12-08 NOTE — Progress Notes (Signed)
Report called to [facility]. Patient transported  to room 334 via wheelchair.

## 2015-12-08 NOTE — H&P (Signed)
[facility] History and Physical  Cody [name] ZOX:[id number] DOB: 1933-07-17 DOA: 12/07/2015  Referring physician: ED physician PCP: No primary care provider on file.  Specialists: Dr. Wyline Mood (cardiology), Dr. Fransico Him (endocrinology)   Chief Complaint:  Dyspnea   HPI: Cody [name] is a 82 y.o. male with PMH of Parkinson dementia, hypertension, insulin-dependent diabetes mellitus, and chronic combined systolic and diastolic CHF who presents the ED from his SNF with progressive dyspnea over the course of the day. Patient was recently admitted to [facility] where a pacer was placed for third-degree heart block. He initially did quite well back at his SNF until becoming progressively dyspneic over the course of the day, prompting the SNF personnel to activate EMS for transport to the hospital. History is obtained primarily from discussion with ED and SNF personnel, as well as chart review. Patient complained of difficulty breathing earlier on the day of his presentation and was noted to be in some respiratory distress. This was noted to worsen when the patient would lay supine, and improve somewhat when seated upright. Patient has known CHF but had remained stable without diuretic therapies. There's been no recent fever, chills, or cough noted. There was no change in the patient's medications at time of the recent hospital discharge. Per SNF personnel, there is been no change in diet or fluid intake. Patient did have an AKI during the recent hospitalization and this resolved after IVF resuscitation, but the patient was apparently not symptomatic from a respiratory standpoint until today.   In ED, patient was found to be afebrile, saturating well on room air, but tachypnea with rate up to the 40s. Heart rate and blood pressure remained stable. Chest x-ray was obtained and demonstrates vascular congestion with increased interstitial markings consistent with interstitial edema. EKG featured a paced  rhythm. Initial troponin returned elevated to a value of 0.06 and BNP was greater than 1000. Patient was treated in the emergency department with 20 mg IV Lasix. He remained hemodynamically stable in the ED and will be admitted to the telemetry unit for ongoing evaluation and management of suspected acute CHF.   Where does patient live?  SNF    Can patient participate in ADLs?  Some   Review of Systems:  Unable to obtain ROS given patient's clinical condition with advanced dementia.     Allergy:  Allergies  Allergen Reactions  . Sulfa Antibiotics     Past Medical History  Diagnosis Date  . Essential hypertension, benign   . Type 2 diabetes mellitus (HCC)   . Dementia   . Parkinson's disease   . Insomnia   . Benign prostate hyperplasia     Past Surgical History  Procedure Laterality Date  . Cardiac catheterization N/A 11/30/2015    Procedure: Temporary Pacemaker;  Surgeon: Orpah Cobb, MD;  Location: [facility] INVASIVE CV LAB;  Service: Cardiovascular;  Laterality: N/A;  . Ep implantable device N/A 12/01/2015    Procedure: Pacemaker Implant;  Surgeon: Duke Salvia, MD;  Location: [facility] INVASIVE CV LAB;  Service: Cardiovascular;  Laterality: N/A;    Social History:  reports that he has quit smoking. His smoking use included Cigarettes. He has a 2 pack-year smoking history. He has never used smokeless tobacco. He reports that he does not drink alcohol or use illicit drugs.  Family History:  Family History  Problem Relation Age of Onset  . Family history unknown: Yes     Prior to Admission medications   Medication Sig Start Date End  Date Taking? Authorizing Provider  aspirin EC 81 MG tablet Take 81 mg by mouth daily.     Yes Historical Provider, MD  Camphor-Eucalyptus-Menthol (MEDICATED CHEST RUB EX) Apply 1 application topically 2 (two) times daily.   Yes Historical Provider, MD  clotrimazole-betamethasone (LOTRISONE) cream Apply 1 application topically 2 (two) times daily as needed  (Applied to scrotum area in a small amount as needed for rash).   Yes Historical Provider, MD  escitalopram (LEXAPRO) 10 MG tablet Take 10 mg by mouth daily.   Yes Historical Provider, MD  fluticasone (FLONASE) 50 MCG/ACT nasal spray Place 1 spray into both nostrils daily.    Yes Historical Provider, MD  haloperidol (HALDOL) 1 MG tablet Take 1 mg by mouth at bedtime.     Yes Historical Provider, MD  hydrOXYzine (ATARAX/VISTARIL) 25 MG tablet Take 25 mg by mouth every 6 (six) hours as needed.   Yes Historical Provider, MD  insulin detemir (LEVEMIR) 100 UNIT/ML injection Inject 0.4 mLs (40 Units total) into the skin at bedtime. 09/28/15  Yes Roma Kayser, MD  insulin lispro (HUMALOG) 100 UNIT/ML injection Inject 15 Units into the skin 3 (three) times daily before meals. Give if blood sugars are over 150   Yes Historical Provider, MD  linagliptin (TRADJENTA) 5 MG TABS tablet Take 5 mg by mouth daily.   Yes Historical Provider, MD  loratadine (CLARITIN) 10 MG tablet Take 10 mg by mouth daily.   Yes Historical Provider, MD  Melatonin 5 MG CAPS Take 10 mg by mouth at bedtime.   Yes Historical Provider, MD  Tamsulosin HCl (FLOMAX) 0.4 MG CAPS Take 0.4 mg by mouth daily.     Yes Historical Provider, MD  temazepam (RESTORIL) 15 MG capsule Take 15 mg by mouth at bedtime as needed for sleep.   Yes Historical Provider, MD  Thiamine HCl (VITAMIN B-1) 100 MG tablet Take 100 mg by mouth daily.     Yes Historical Provider, MD    Physical Exam: Filed Vitals:   12/08/15 0000 12/08/15 0015 12/08/15 0030 12/08/15 0140  BP: 172/98  167/81 159/80  Pulse: 70 68  65  Temp:      TempSrc:      Resp: 20 42 26 23  SpO2: 97% 95%  95%   General: Not in acute distress HEENT:       Eyes: PERRL, EOMI, no scleral icterus or conjunctival pallor. Left eye post-surgical.       ENT: No discharge from the ears or nose, no pharyngeal ulcers, petechiae or exudate, no tonsillar enlargement.        Neck: No bruit, no  appreciable mass. Neck veins distended Heme: No cervical adenopathy, no pallor Cardiac: S1/S2, RRR, No murmurs, No gallops or rubs. Pulm: Good air movement bilaterally. Crackles over b/l lung fields. Abd: Soft, nondistended, nontender, no rebound pain or gaurding, no mass or organomegaly, BS present. Ext: Edema to knees b/l. 2+DP/PT pulse bilaterally. Musculoskeletal: No gross deformity, no red, hot, swollen joints   Skin: No rashes or wounds on exposed surfaces  Neuro: Alert, oriented to person and place, no facial assymmetry, moving all extremities spontaneously, muscle strength 5/5 in all extremities, sensation to light touch intact. Brachial reflex 2+ bilaterally. Knee reflex 2+ bilaterally. Negative Babinski's sign. No focal findings Psych: Patient is not overtly psychotic, appropriate mood and affect.  Labs on Admission:  Basic Metabolic Panel:  Recent Labs Lab 12/01/15 0549 12/02/15 0705 12/03/15 0255 12/04/15 0430 12/07/15 2144  NA 140 143  142 144 137  K 4.6 3.9 3.7 3.7 3.7  CL 106 108 109 108 100*  CO2 21* 24 25 28 30   GLUCOSE 349* 100* 172* 174* 194*  BUN 43* 55* 47* 37* 30*  CREATININE 2.07* 1.73* 1.36* 1.15 1.08  CALCIUM 8.5* 7.9* 8.0* 8.2* 8.4*   Liver Function Tests:  Recent Labs Lab 12/07/15 2144  AST 31  ALT 110*  ALKPHOS 76  BILITOT 0.8  PROT 6.3*  ALBUMIN 3.1*   No results for input(s): LIPASE, AMYLASE in the last 168 hours. No results for input(s): AMMONIA in the last 168 hours. CBC:  Recent Labs Lab 12/01/15 0549 12/03/15 0255 12/07/15 2144  WBC 13.5* 10.8* 9.3  NEUTROABS  --   --  6.0  HGB 11.8* 11.6* 12.0*  HCT 36.9* 35.5* 35.8*  MCV 86.8 89.2 88.2  PLT 137* 117* 155   Cardiac Enzymes:  Recent Labs Lab 12/01/15 1037 12/07/15 2144  TROPONINI 0.89* 0.06*    BNP (last 3 results)  Recent Labs  12/07/15 2144  BNP 1063.0*    ProBNP (last 3 results) No results for input(s): PROBNP in the last 8760 hours.  CBG:  Recent  Labs Lab 12/03/15 1214 12/03/15 1620 12/03/15 2130 12/04/15 0538 12/04/15 1112  GLUCAP 154* 257* 243* 142* 213*    Radiological Exams on Admission: Dg Chest Portable 1 View  12/07/2015  CLINICAL DATA:  Acute onset of worsening shortness of breath. Initial encounter. EXAM: PORTABLE CHEST 1 VIEW COMPARISON:  Chest radiograph performed 12/02/2015 FINDINGS: A somewhat loculated small right pleural effusion is again noted, with associated opacity likely reflecting atelectasis. Underlying vascular congestion is seen, with increased interstitial markings, raising concern for mild interstitial edema. No pneumothorax is identified. The cardiomediastinal silhouette is borderline enlarged. A pacemaker is noted overlying the left chest wall, with leads ending overlying the right atrium and right ventricle. No acute osseous abnormalities are seen. IMPRESSION: 1. Vascular congestion and borderline cardiomegaly, with increased interstitial markings, raising concern for mild interstitial edema. 2. Somewhat loculated small right pleural effusion again noted, with associated opacity likely reflecting atelectasis. Electronically Signed   By: Roanna Raider M.D.   On: 12/07/2015 21:52    EKG: Independently reviewed.  Abnormal findings:  Paced rhythm   Assessment/Plan  1. Acute on chronic combined systolic/diastolic CHF  - CXR features increased interstitial edema and BNP is >1000  - 20 mg IV Lasix given in ED, but without significant response  - Repeat Lasix now with 40 mg IV and follow UOP  - SLIV, strict I/Os, daily wts, fluid-restrict diet  - Follow fluid status, electrolytes, intervene as needed  - May need diuretic at discharge   2. Insulin-dependent type II DM  - Taking Levemir 40 units qHS, Humalog 15 units TID with meals at home  - Start with Levemir 30 units qHS, Humalog 6 units with meals, and moderate-intensity SSI correctional; adjust prn  - CBG with meals and qHS  - A1c 8.7% on 11/15/15,  reflecting sub-optimal glycemic control  - Carb-modified diet when appropriate   3. Hypertension  - Has been modestly elevated here  - Expect improvement with diuresis  - Monitor, add antihypertensive prn   4. Anemia  - Hbg 12.0 on arrival, consistent with his baseline  - No sign of active blood loss  - Monitor    5. Dementia with behavior disturbance - Continue home-dose Lexapro, Haldol  - Appears stable, monitor      DVT ppx:  SQ Lovenox  Code Status: Full code Family Communication: None at bed side.               Disposition Plan: Admit to inpatient   Date of Service 12/08/2015    Briscoe Deutscher, MD [facility] Pager [phone]  If 7PM-7AM, please contact night-coverage [email or web address] Password [facility] 12/08/2015, 2:56 AM

## 2015-12-08 NOTE — Clinical Social Work Note (Signed)
Clinical Social Work Assessment  Patient Details  Name: Cody [name] MRN: [id number] Date of Birth: 01-18-1933  Date of referral:  12/08/15               Reason for consult:  Facility Placement (Return to [facility])                Permission sought to share information with:  Oceanographer granted to share information::  Yes, Verbal Permission Granted  Name::     [employer]  Agency::     Relationship::     Contact Information:     Housing/Transportation Living arrangements for the past 2 months:  Assisted Living Facility Source of Information:  Patient, Facility (Mrs. Hart [facility]- Director of [facility]) Patient Interpreter Needed:  None Criminal Activity/Legal Involvement Pertinent to Current Situation/Hospitalization:  No - Comment as needed Significant Relationships:  None Lives with:  Facility Resident Do you feel safe going back to the place where you live?  Yes Need for family participation in patient care:  No (Coment)  Care giving concerns: "I got short of breath and got weak."     Office manager / plan:  Resident of [facility] where he lives long term.  Recently hospitalized and returned to facility. He has [facility] for services at facility. CSW spoke with Mrs. Hart Rochester- explained that d/c is anticipate Saturday 12/08/15 and she stated that they would accept patient back.  Will not be able to provide transportation. Patient does not have any family that is involved in his care per patient or facility.  Fl2 will be completed and placed on chart.  Discussed with Dr. Kerry Hough and will hopefully release tomorrow to facility.   Employment status:  Retired Health and safety inspector:    PT Recommendations:  Not assessed at this time Information / Referral to community resources:     Patient/Family's Response to care:  Patient denies any questions or concerns. States he is feeling better.  Patient/Family's Understanding  of and Emotional Response to Diagnosis, Current Treatment, and Prognosis:  Patient appears to have poor insight into his understanding of his diagnosis and prognosis but it should be noted that he does have dementia.  He feels that he is being "taken care of."  Denies any needs or concerns. Emotional Assessment Appearance:  Appears stated age Attitude/Demeanor/Rapport:   (Quiet, cooperative) Affect (typically observed):  Quiet, Pleasant Orientation:  Oriented to Self, Oriented to Place Alcohol / Substance use:  Tobacco Use (no longer smokes) Psych involvement (Current and /or in the community):  No (Comment)  Discharge Needs  Concerns to be addressed:    Readmission within the last 30 days:    Current discharge risk:  Dependent with Mobility, Cognitively Impaired (has dementia) Barriers to Discharge:  Continued Medical Work up   Lovette Cliche T, LCSW 12/08/2015, 11:08 AM

## 2015-12-08 NOTE — Care Management Note (Signed)
Case Management Note  Patient Details  Name: EDWING FIGLEY MRN: [id number] Date of Birth: November 21, 1932  Subjective/Objective:                  Pt admitted with CHF. Pt is from [facility]. Pt receives [facility] services through [facility]. Abby, of [facility], made aware of admission. Pt plans to return to facility over weekend with resumption of HH services. CSW is aware of DC plan and will arrange for return to facility at DC.   Action/Plan: Abby will be notified when pt is DC'd. No further CM needs.   Expected Discharge Date:     12/09/2015             Expected Discharge Plan:  Home w Home Health Services  In-House Referral:  Clinical Social Work  Discharge planning Services  CM Consult  Post Acute Care Choice:  Resumption of Svcs/PTA Provider, Home Health Choice offered to:  [facility] POA / Guardian  DME Arranged:    DME Agency:     HH Arranged:  RN, PT HH Agency:  [facility]  Status of Service:  Completed, signed off  Medicare Important Message Given:    yes Date Medicare IM Given:    Medicare IM give by:    Date Additional Medicare IM Given:    Additional Medicare Important Message give by:     If discussed at Long Length of Stay Meetings, dates discussed:    Additional Comments:  Malcolm Metro, RN 12/08/2015, 1:30 PM

## 2015-12-08 NOTE — Progress Notes (Signed)
Hypoglycemic Event  CBG:46  Treatment: 15 GM carbohydrate snack  Symptoms: None  Follow-up CBG: Time 0820 CBG Result 94  Possible Reasons for Event: Unknown  Comments/MD notified yes    Reveca Desmarais, Roxanne Gates

## 2015-12-08 NOTE — Care Management Important Message (Signed)
Important Message  Patient Details  Name: Cody [name] MRN: [id number] Date of Birth: 04-25-1933   Medicare Important Message Given:  Yes    Malcolm Metro, RN 12/08/2015, 1:32 PM

## 2015-12-08 NOTE — NC FL2 (Signed)
[place] MEDICAID FL2 LEVEL OF CARE SCREENING TOOL     IDENTIFICATION  Patient Name: Cody [name] Birthdate: March 31, 1933 Sex: male Admission Date (Current Location): 12/07/2015  [facility] and [place] Number:  [employer] and Address:  [facility],  [address] [place] [address]      Provider Number: [id number]  Attending Physician Name and Address:  Erick Blinks, MD  Relative Name and Phone Number:       Current Level of Care: Hospital Recommended Level of Care: Assisted Living Facility Prior Approval Number:    Date Approved/Denied:   PASRR Number:    Discharge Plan: Domiciliary (Rest home)    Current Diagnoses: Patient Active Problem List   Diagnosis Date Noted  . CHF (congestive heart failure) (HCC) 12/08/2015  . Acute on chronic combined systolic and diastolic CHF (congestive heart failure) (HCC) 12/08/2015  . Insulin dependent diabetes mellitus (HCC) 12/08/2015  . Normocytic anemia   . Symptomatic bradycardia 11/30/2015  . Atrioventricular block, complete (HCC) 11/30/2015  . Nausea and vomiting 11/30/2015  . AKI (acute kidney injury) (HCC) 11/30/2015  . Complete heart block (HCC)   . Faintness   . Essential hypertension, benign 09/28/2015  . Sepsis (HCC) 12/06/2014  . Influenza, pneumonia 12/06/2014  . Syncope 12/05/2014  . BPH (benign prostatic hyperplasia) 12/05/2014  . Dementia 12/05/2014  . Diabetes mellitus without complication (HCC) 12/05/2014    Orientation RESPIRATION BLADDER Height & Weight     Self, Place  Normal Incontinent Weight: 165 lb 9.1 oz (75.1 kg) Height:   (167.6 cm)  BEHAVIORAL SYMPTOMS/MOOD NEUROLOGICAL BOWEL NUTRITION STATUS      Incontinent Diet (Carb Modified)  AMBULATORY STATUS COMMUNICATION OF NEEDS Skin   Limited Assist Verbally Surgical wounds                       Personal Care Assistance Level of Assistance  Bathing, Dressing Bathing Assistance: Limited assistance    Dressing Assistance: Limited assistance     Functional Limitations Info             SPECIAL CARE FACTORS FREQUENCY  PT (By licensed PT)     PT Frequency: 3X week              Contractures Contractures Info: Not present    Additional Factors Info  Code Status, Allergies Code Status Info: Full Allergies Info: Sulfa Antibiotics           Current Medications (12/08/2015):  This is the current hospital active medication list Current Facility-Administered Medications  Medication Dose Route Frequency Provider Last Rate Last Dose  . 0.9 %  sodium chloride infusion  250 mL Intravenous PRN Briscoe Deutscher, MD      . acetaminophen (TYLENOL) tablet 650 mg  650 mg Oral Q4H PRN Briscoe Deutscher, MD      . aspirin EC tablet 81 mg  81 mg Oral Daily Briscoe Deutscher, MD   81 mg at 12/08/15 1006  . enoxaparin (LOVENOX) injection 40 mg  40 mg Subcutaneous Q24H Briscoe Deutscher, MD   40 mg at 12/08/15 1005  . escitalopram (LEXAPRO) tablet 10 mg  10 mg Oral Daily Briscoe Deutscher, MD   10 mg at 12/08/15 1006  . fluticasone (FLONASE) 50 MCG/ACT nasal spray 1 spray  1 spray Each Nare Daily Briscoe Deutscher, MD   1 spray at 12/08/15 1005  . furosemide (LASIX) injection 40 mg  40 mg Intravenous BID  Erick Blinks, MD      . haloperidol (HALDOL) tablet 1 mg  1 mg Oral QHS Lavone Neri Opyd, MD      . hydrALAZINE (APRESOLINE) tablet 25 mg  25 mg Oral 3 times per day Erick Blinks, MD      . insulin aspart (novoLOG) injection 0-15 Units  0-15 Units Subcutaneous TID WC Briscoe Deutscher, MD   0 Units at 12/08/15 0800  . insulin detemir (LEVEMIR) injection 20 Units  20 Units Subcutaneous QHS Erick Blinks, MD      . lisinopril (PRINIVIL,ZESTRIL) tablet 2.5 mg  2.5 mg Oral Daily Briscoe Deutscher, MD   2.5 mg at 12/08/15 1006  . loratadine (CLARITIN) tablet 10 mg  10 mg Oral Daily Briscoe Deutscher, MD   10 mg at 12/08/15 1006  . ondansetron (ZOFRAN) injection 4 mg  4 mg Intravenous Q6H PRN Lavone Neri Opyd, MD      .  sodium chloride flush (NS) 0.9 % injection 3 mL  3 mL Intravenous Q12H Lavone Neri Opyd, MD   3 mL at 12/08/15 1010  . sodium chloride flush (NS) 0.9 % injection 3 mL  3 mL Intravenous PRN Lavone Neri Opyd, MD      . tamsulosin (FLOMAX) capsule 0.4 mg  0.4 mg Oral Daily Briscoe Deutscher, MD   0.4 mg at 12/08/15 1006  . temazepam (RESTORIL) capsule 15 mg  15 mg Oral QHS PRN Briscoe Deutscher, MD      . thiamine (VITAMIN B-1) tablet 100 mg  100 mg Oral Daily Briscoe Deutscher, MD   100 mg at 12/08/15 1006     Discharge Medications: Please see discharge summary for a list of discharge medications.  Relevant Imaging Results:  Relevant Lab Results:   Additional Information SSN:  [id number] [place], [place]

## 2015-12-08 NOTE — Progress Notes (Signed)
Patient admitted to the hospital earlier this morning by Dr. Antionette Char  Patient seen and examined. He denies any chest pain or shortness of breath. Appears to be resting comfortably. Lungs are mostly clear and trace pedal edema is present  He was admitted to the hospital with acute CHF. He has been started on lasix. Will continue treatments for another 24 hours. Anticipate discharge home in the next 24-48 hours.  Cody [name]

## 2015-12-08 NOTE — Progress Notes (Signed)
Patient ambulated from bed to commode in room. Patient required two person assist with ambulation . Patient shuffles his feet. SOB noted after patient ambulated back to the bed.

## 2015-12-09 DIAGNOSIS — F0391 Unspecified dementia with behavioral disturbance: Secondary | ICD-10-CM

## 2015-12-09 DIAGNOSIS — D649 Anemia, unspecified: Secondary | ICD-10-CM

## 2015-12-09 LAB — BASIC METABOLIC PANEL
Anion gap: 14 (ref 5–15)
BUN: 40 mg/dL — AB (ref 6–20)
CALCIUM: 8.3 mg/dL — AB (ref 8.9–10.3)
CO2: 28 mmol/L (ref 22–32)
Chloride: 100 mmol/L — ABNORMAL LOW (ref 101–111)
Creatinine, Ser: 1.33 mg/dL — ABNORMAL HIGH (ref 0.61–1.24)
GFR calc Af Amer: 57 mL/min — ABNORMAL LOW (ref >60)
GFR, EST NON AFRICAN AMERICAN: 49 mL/min — AB (ref >60)
GLUCOSE: 230 mg/dL — AB (ref 65–99)
Potassium: 3.2 mmol/L — ABNORMAL LOW (ref 3.5–5.1)
Sodium: 142 mmol/L (ref 135–145)

## 2015-12-09 LAB — GLUCOSE, CAPILLARY
Glucose-Capillary: 190 mg/dL — ABNORMAL HIGH (ref 65–99)
Glucose-Capillary: 282 mg/dL — ABNORMAL HIGH (ref 65–99)

## 2015-12-09 LAB — HEMOGLOBIN A1C
HEMOGLOBIN A1C: 8.4 % — AB (ref 4.8–5.6)
Mean Plasma Glucose: 194 mg/dL

## 2015-12-09 LAB — INFLUENZA PANEL BY PCR (TYPE A & B)
H1N1 flu by pcr: NOT DETECTED
Influenza A By PCR: NEGATIVE
Influenza B By PCR: NEGATIVE

## 2015-12-09 MED ORDER — METOPROLOL SUCCINATE ER 25 MG PO TB24: 25.0000 mg | ORAL_TABLET | Freq: Every day | ORAL | Status: AC

## 2015-12-09 MED ORDER — POTASSIUM CHLORIDE CRYS ER 20 MEQ PO TBCR: 20.0000 meq | EXTENDED_RELEASE_TABLET | Freq: Every day | ORAL | Status: DC

## 2015-12-09 MED ORDER — LISINOPRIL 2.5 MG PO TABS: 2.5000 mg | ORAL_TABLET | Freq: Every day | ORAL | Status: AC

## 2015-12-09 MED ORDER — POTASSIUM CHLORIDE CRYS ER 20 MEQ PO TBCR
40.0000 meq | EXTENDED_RELEASE_TABLET | Freq: Once | ORAL | Status: AC
  Administered 2015-12-09: 40 meq via ORAL
  Filled 2015-12-09: qty 2

## 2015-12-09 MED ORDER — FUROSEMIDE 40 MG PO TABS: 40.0000 mg | ORAL_TABLET | Freq: Every day | ORAL | Status: DC

## 2015-12-09 MED ORDER — HYDRALAZINE HCL 25 MG PO TABS: 25.0000 mg | ORAL_TABLET | Freq: Three times a day (TID) | ORAL | Status: DC

## 2015-12-09 NOTE — Discharge Summary (Signed)
Physician Discharge Summary  Cody [name] ZOX:[id number] DOB: July 20, 1933 DOA: 12/07/2015  PCP: Laqueta Linden, MD  Admit date: 12/07/2015 Discharge date: 12/09/2015  Time spent: 35 minutes  Recommendations for Outpatient Follow-up:  1. Follow up with PCP in 1-2 weeks.  2. Follow up with primary cardiologist in 1-2 weeks. 3. Repeat bmet in 1 week to follow renal function   Discharge Diagnoses:  Principal Problem:   Acute on chronic combined systolic and diastolic CHF (congestive heart failure) (HCC) Active Problems:   Dementia   Essential hypertension, benign   Insulin dependent diabetes mellitus (HCC)   Normocytic anemia   Discharge Condition: Improved  Diet recommendation: Heart healthy  Filed Weights   12/08/15 0300 12/09/15 0600  Weight: 75.1 kg (165 lb 9.1 oz) 75.116 kg (165 lb 9.6 oz)    History of present illness:  28 yom with a hx of dementia, HTN, DM type 2, and chronic combined diastolic and systolic CHF presented with worsening dyspnea. While in the ED, patient was tachypneic but saturating well on RA. BNP was >1000 and CXR revealed vascular congestion and interstitial edema. He was admitted for further management.   Hospital Course:  Patient presented with acute on chronic combined systolic/diastolic CHF. BNP on admission was 1063. He was started on IV Lasix with significant UOP and overall improvement. His dyspnea is resolved and he appears to be approaching euvolemia. He will continue his oral Lasix upon discharge with recommendations to monitor his daily weights. Patient is on an ACE-I and will be started on Toprol XL. Recommend close follow up with cardiology in 1-2 weeks.    1. DM type 2, blood sugars stable. Continue home management. Hgb A1c 8.7 on 11/15/15.  2. Essential HTN, stable. Continue home medications 3. Chronic anemia, hgb at baseline. No evidence of active bleeding. 4. Dementia with behavioral disturbance. Continue Lexapro,  Haldol.  Procedures:  none  Consultations:  none  Discharge Exam: Filed Vitals:   12/08/15 2055 12/09/15 0600  BP: 136/61 142/68  Pulse: 80   Temp: 99.9 F (37.7 C) 98.7 F (37.1 C)  Resp: 23 20     General: NAD, looks comfortable  Cardiovascular: RRR, S1, S2   Respiratory: clear bilaterally, No wheezing, rales or rhonchi  Abdomen: soft, non tender, no distention , bowel sounds normal  Musculoskeletal: No edema b/l  Discharge Instructions   Discharge Instructions    Diet - low sodium heart healthy    Complete by:  As directed      Increase activity slowly    Complete by:  As directed           Current Discharge Medication List    START taking these medications   Details  furosemide (LASIX) 40 MG tablet Take 1 tablet (40 mg total) by mouth daily. Qty: 30 tablet, Refills: 1    lisinopril (PRINIVIL,ZESTRIL) 2.5 MG tablet Take 1 tablet (2.5 mg total) by mouth daily. Qty: 30 tablet, Refills: 1    metoprolol succinate (TOPROL XL) 25 MG 24 hr tablet Take 1 tablet (25 mg total) by mouth daily. Qty: 30 tablet, Refills: 1    potassium chloride SA (K-DUR,KLOR-CON) 20 MEQ tablet Take 1 tablet (20 mEq total) by mouth daily. Qty: 30 tablet, Refills: 0      CONTINUE these medications which have NOT CHANGED   Details  aspirin EC 81 MG tablet Take 81 mg by mouth daily.      Camphor-Eucalyptus-Menthol (MEDICATED CHEST RUB EX) Apply 1 application topically 2 (  two) times daily.    clotrimazole-betamethasone (LOTRISONE) cream Apply 1 application topically 2 (two) times daily as needed (Applied to scrotum area in a small amount as needed for rash).    escitalopram (LEXAPRO) 10 MG tablet Take 10 mg by mouth daily.    fluticasone (FLONASE) 50 MCG/ACT nasal spray Place 1 spray into both nostrils daily.     haloperidol (HALDOL) 1 MG tablet Take 1 mg by mouth at bedtime.      hydrOXYzine (ATARAX/VISTARIL) 25 MG tablet Take 25 mg by mouth every 6 (six) hours as needed.     insulin detemir (LEVEMIR) 100 UNIT/ML injection Inject 0.4 mLs (40 Units total) into the skin at bedtime. Qty: 10 mL, Refills: 3    insulin lispro (HUMALOG) 100 UNIT/ML injection Inject 15 Units into the skin 3 (three) times daily before meals. Give if blood sugars are over 150    linagliptin (TRADJENTA) 5 MG TABS tablet Take 5 mg by mouth daily.    loratadine (CLARITIN) 10 MG tablet Take 10 mg by mouth daily.    Melatonin 5 MG CAPS Take 10 mg by mouth at bedtime.    Tamsulosin HCl (FLOMAX) 0.4 MG CAPS Take 0.4 mg by mouth daily.      temazepam (RESTORIL) 15 MG capsule Take 15 mg by mouth at bedtime as needed for sleep.    Thiamine HCl (VITAMIN B-1) 100 MG tablet Take 100 mg by mouth daily.         Allergies  Allergen Reactions  . Sulfa Antibiotics       The results of significant diagnostics from this hospitalization (including imaging, microbiology, ancillary and laboratory) are listed below for reference.    Significant Diagnostic Studies: Dg Chest Portable 1 View  12/07/2015  CLINICAL DATA:  Acute onset of worsening shortness of breath. Initial encounter. EXAM: PORTABLE CHEST 1 VIEW COMPARISON:  Chest radiograph performed 12/02/2015 FINDINGS: A somewhat loculated small right pleural effusion is again noted, with associated opacity likely reflecting atelectasis. Underlying vascular congestion is seen, with increased interstitial markings, raising concern for mild interstitial edema. No pneumothorax is identified. The cardiomediastinal silhouette is borderline enlarged. A pacemaker is noted overlying the left chest wall, with leads ending overlying the right atrium and right ventricle. No acute osseous abnormalities are seen. IMPRESSION: 1. Vascular congestion and borderline cardiomegaly, with increased interstitial markings, raising concern for mild interstitial edema. 2. Somewhat loculated small right pleural effusion again noted, with associated opacity likely reflecting  atelectasis. Electronically Signed   By: Roanna Raider M.D.   On: 12/07/2015 21:52   Dg Chest Port 1 View  12/02/2015  CLINICAL DATA:  Cardiac device in situ. EXAM: PORTABLE CHEST 1 VIEW COMPARISON:  11/30/2015 FINDINGS: Cardiac pacemaker. No pneumothorax. Shallow inspiration with elevation of right hemidiaphragm. Atelectasis in the right lung base. Small right pleural effusion layering along the lateral aspect, possibly loculated. Mild cardiac enlargement with central perihilar infiltration possibly due to edema. IMPRESSION: Cardiac enlargement with mild perihilar edema. Atelectasis in the right lung base with small right pleural effusion. Electronically Signed   By: Burman Nieves M.D.   On: 12/02/2015 04:57   Dg Chest Port 1 View  11/30/2015  CLINICAL DATA:  Syncopal episode EXAM: PORTABLE CHEST 1 VIEW COMPARISON:  07/16/2015 FINDINGS: Cardiac shadow is stable. The lungs are well aerated bilaterally. Increased density along the lateral chest wall on the right is noted consistent with effusion. This may be somewhat loculated given its appearance. No focal infiltrate is seen. IMPRESSION: Apparent  loculated effusion laterally on the right. Electronically Signed   By: Alcide Clever M.D.   On: 11/30/2015 20:07    Microbiology: Recent Results (from the past 240 hour(s))  MRSA PCR Screening     Status: Abnormal   Collection Time: 11/30/15 10:32 PM  Result Value Ref Range Status   MRSA by PCR POSITIVE (A) NEGATIVE Final    Comment:        The GeneXpert MRSA Assay (FDA approved for NASAL specimens only), is one component of a comprehensive MRSA colonization surveillance program. It is not intended to diagnose MRSA infection nor to guide or monitor treatment for MRSA infections. RESULT CALLED TO, READ BACK BY AND VERIFIED WITH: N SNOW  12/01/15 MKELLY   MRSA PCR Screening     Status: None   Collection Time: 12/08/15  3:00 AM  Result Value Ref Range Status   MRSA by PCR NEGATIVE  NEGATIVE Final    Comment:        The GeneXpert MRSA Assay (FDA approved for NASAL specimens only), is one component of a comprehensive MRSA colonization surveillance program. It is not intended to diagnose MRSA infection nor to guide or monitor treatment for MRSA infections.      Labs: Basic Metabolic Panel:  Recent Labs Lab 12/03/15 0255 12/04/15 0430 12/07/15 2144 12/09/15 0650  NA 142 144 137 142  K 3.7 3.7 3.7 3.2*  CL 109 108 100* 100*  CO2 GLUCOSE 172* 174* 194* 230*  BUN 47* 37* 30* 40*  CREATININE 1.36* 1.15 1.08 1.33*  CALCIUM 8.0* 8.2* 8.4* 8.3*   Liver Function Tests:  Recent Labs Lab 12/07/15 2144  AST 31  ALT 110*  ALKPHOS 76  BILITOT 0.8  PROT 6.3*  ALBUMIN 3.1*    CBC:  Recent Labs Lab 12/03/15 0255 12/07/15 2144  WBC 10.8* 9.3  NEUTROABS  --  6.0  HGB 11.6* 12.0*  HCT 35.5* 35.8*  MCV 89.2 88.2  PLT 117* 155   Cardiac Enzymes:  Recent Labs Lab 12/07/15 2144  TROPONINI 0.06*   BNP: BNP (last 3 results)  Recent Labs  12/07/15 2144  BNP 1063.0*    CBG:  Recent Labs Lab 12/08/15 0735 12/08/15 0819 12/08/15 1144 12/08/15 1616 12/08/15 2059  GLUCAP 47* 94 137* 123* 152*      Signed: Hawraa Stambaugh. MD [facility] 12/09/2015, 11:37 AM    By signing my name below, I, Burnett Harry, attest that this documentation has been prepared under the direction and in the presence of [facility]. MD Electronically Signed: Burnett Harry, Scribe. 12/09/2015  I, Dr. Erick Blinks, personally performed the services described in this documentaiton. All medical record entries made by the scribe were at my direction and in my presence. I have reviewed the chart and agree that the record reflects my personal performance and is accurate and complete  Erick Blinks, MD, 12/09/2015 11:37 AM

## 2015-12-09 NOTE — Progress Notes (Signed)
Report called to Selena Batten at [facility]

## 2015-12-09 NOTE — Clinical Social Work Note (Addendum)
Clinical Social Worker has contacted [facility], Selena Batten in regards to patient's return.   Clinical Social Worker facilitated patient discharge including contacting patient family (contact information listed is non working number) and facility to confirm patient discharge plans.  Clinical information faxed to facility and family agreeable with plan.  Bedside RN to arrange ambulance transport via [facility] to [facility].  RN to call report prior to discharge.  Clinical Social Worker will sign off for now as social work intervention is no longer needed. Please consult [place] again if new need arises.  Derenda Fennel, MSW, LCSWA [phone] 12/09/2015 1:08 PM

## 2015-12-09 NOTE — Progress Notes (Signed)
CM contacted [facility] for services.  Spoke with Abby and faxed information for processing.  CM informed HH that patient is discharging today and returning to ALF.

## 2015-12-13 ENCOUNTER — Encounter: Payer: Self-pay | Admitting: Internal Medicine

## 2015-12-13 ENCOUNTER — Ambulatory Visit (INDEPENDENT_AMBULATORY_CARE_PROVIDER_SITE_OTHER): Payer: Medicare Other | Admitting: *Deleted

## 2015-12-13 DIAGNOSIS — Z959 Presence of cardiac and vascular implant and graft, unspecified: Secondary | ICD-10-CM

## 2015-12-13 DIAGNOSIS — I442 Atrioventricular block, complete: Secondary | ICD-10-CM | POA: Diagnosis not present

## 2015-12-13 LAB — CUP PACEART INCLINIC DEVICE CHECK
Battery Remaining Longevity: 115.2
Date Time Interrogation Session: 20170301104155
Implantable Lead Implant Date: 20170217
Lead Channel Pacing Threshold Amplitude: 0.625 V
Lead Channel Pacing Threshold Amplitude: 0.875 V
Lead Channel Pacing Threshold Pulse Width: 0.5 ms
Lead Channel Setting Pacing Amplitude: 0.875
Lead Channel Setting Pacing Amplitude: 1.875
Lead Channel Setting Pacing Pulse Width: 0.5 ms
Lead Channel Setting Sensing Sensitivity: 4 mV
MDC IDC LEAD IMPLANT DT: 20170217
MDC IDC LEAD LOCATION: 753859
MDC IDC LEAD LOCATION: 753860
MDC IDC MSMT BATTERY VOLTAGE: 3.05 V
MDC IDC MSMT LEADCHNL RA IMPEDANCE VALUE: 387.5 Ohm
MDC IDC MSMT LEADCHNL RA PACING THRESHOLD PULSEWIDTH: 0.5 ms
MDC IDC MSMT LEADCHNL RA SENSING INTR AMPL: 0.7 mV
MDC IDC MSMT LEADCHNL RV IMPEDANCE VALUE: 525 Ohm
MDC IDC MSMT LEADCHNL RV SENSING INTR AMPL: 12 mV
MDC IDC PG SERIAL: 7866760
MDC IDC STAT BRADY RA PERCENT PACED: 40 %
MDC IDC STAT BRADY RV PERCENT PACED: 99.63 %
Pulse Gen Model: 2240

## 2015-12-13 NOTE — Progress Notes (Signed)
Wound check appointment. Dermabond removed. Wound without redness or edema. Incision edges approximated, wound well healed. Normal device function. Thresholds and impedances consistent with implant measurements. P waves decreased from 1.25mV at next day check to 0.67mV today, sensitivity changed from 0.41mV to 0.43mv. Reviewed findings and cxray with GT who recommended keeping scheduled F/U. Device auto capture programmed on for extra safety margin until 3 month visit. Histogram distribution appropriate for patient and level of activity. One AT episode lasting 6sec. Peak A 160, peak V 70. Patient educated about wound care, arm mobility, lifting restrictions. ROV in 3 months with implanting physician.

## 2015-12-19 ENCOUNTER — Other Ambulatory Visit: Payer: Self-pay | Admitting: "Endocrinology

## 2015-12-19 LAB — BASIC METABOLIC PANEL
BUN: 55 mg/dL — ABNORMAL HIGH (ref 7–25)
CHLORIDE: 100 mmol/L (ref 98–110)
CO2: 31 mmol/L (ref 20–31)
Calcium: 9.3 mg/dL (ref 8.6–10.3)
Creat: 1.49 mg/dL — ABNORMAL HIGH (ref 0.70–1.11)
GLUCOSE: 129 mg/dL — AB (ref 65–99)
POTASSIUM: 3.9 mmol/L (ref 3.5–5.3)
SODIUM: 144 mmol/L (ref 135–146)

## 2015-12-20 LAB — HEMOGLOBIN A1C
Hgb A1c MFr Bld: 8.5 % — ABNORMAL HIGH (ref <5.7)
Mean Plasma Glucose: 197 mg/dL — ABNORMAL HIGH (ref <117)

## 2015-12-27 ENCOUNTER — Ambulatory Visit (INDEPENDENT_AMBULATORY_CARE_PROVIDER_SITE_OTHER): Payer: Medicare Other | Admitting: "Endocrinology

## 2015-12-27 ENCOUNTER — Encounter: Payer: Self-pay | Admitting: "Endocrinology

## 2015-12-27 VITALS — BP 136/73 | HR 78 | Ht 66.0 in | Wt 151.0 lb

## 2015-12-27 DIAGNOSIS — I10 Essential (primary) hypertension: Secondary | ICD-10-CM | POA: Diagnosis not present

## 2015-12-27 DIAGNOSIS — E1159 Type 2 diabetes mellitus with other circulatory complications: Secondary | ICD-10-CM | POA: Diagnosis not present

## 2015-12-27 NOTE — Progress Notes (Signed)
Subjective:    Patient ID: Cody [name], male    DOB: 05-19-1933, PCP Laqueta Linden, MD   Past Medical History  Diagnosis Date  . Essential hypertension, benign   . Type 2 diabetes mellitus (HCC)   . Dementia   . Parkinson's disease   . Insomnia   . Benign prostate hyperplasia    Past Surgical History  Procedure Laterality Date  . Cardiac catheterization N/A 11/30/2015    Procedure: Temporary Pacemaker;  Surgeon: Orpah Cobb, MD;  Location: [facility] INVASIVE CV LAB;  Service: Cardiovascular;  Laterality: N/A;  . Ep implantable device N/A 12/01/2015    Procedure: Pacemaker Implant;  Surgeon: Duke Salvia, MD;  Location: [facility] INVASIVE CV LAB;  Service: Cardiovascular;  Laterality: N/A;   Social History   Social History  . Marital Status: Single    Spouse Name: N/A  . Number of Children: N/A  . Years of Education: N/A   Social History Main Topics  . Smoking status: Former Smoker -- 0.50 packs/day for 4 years    Types: Cigarettes  . Smokeless tobacco: Never Used  . Alcohol Use: No  . Drug Use: No  . Sexual Activity: Not Currently   Other Topics Concern  . Not on file   Social History Narrative   Outpatient Encounter Prescriptions as of 12/27/2015  Medication Sig  . aspirin EC 81 MG tablet Take 81 mg by mouth daily.    . Camphor-Eucalyptus-Menthol (MEDICATED CHEST RUB EX) Apply 1 application topically 2 (two) times daily.  . clotrimazole-betamethasone (LOTRISONE) cream Apply 1 application topically 2 (two) times daily as needed (Applied to scrotum area in a small amount as needed for rash). Reported on 12/13/2015  . escitalopram (LEXAPRO) 10 MG tablet Take 10 mg by mouth daily.  . fluticasone (FLONASE) 50 MCG/ACT nasal spray Place 1 spray into both nostrils daily.   . furosemide (LASIX) 40 MG tablet Take 1 tablet (40 mg total) by mouth daily. (Patient not taking: Reported on 12/13/2015)  . haloperidol (HALDOL) 1 MG tablet Take 1 mg by mouth at bedtime.    .  hydrochlorothiazide (HYDRODIURIL) 25 MG tablet Take 25 mg by mouth daily.  . hydrOXYzine (ATARAX/VISTARIL) 25 MG tablet Take 25 mg by mouth every 6 (six) hours as needed.  . insulin detemir (LEVEMIR) 100 UNIT/ML injection Inject 0.4 mLs (40 Units total) into the skin at bedtime.  . insulin lispro (HUMALOG) 100 UNIT/ML injection Inject 15 Units into the skin 3 (three) times daily before meals. Give if blood sugars are over 150  . linagliptin (TRADJENTA) 5 MG TABS tablet Take 5 mg by mouth daily.  Marland Kitchen lisinopril (PRINIVIL,ZESTRIL) 2.5 MG tablet Take 1 tablet (2.5 mg total) by mouth daily. (Patient not taking: Reported on 12/13/2015)  . loratadine (CLARITIN) 10 MG tablet Take 10 mg by mouth daily.  . Melatonin 5 MG CAPS Take 10 mg by mouth at bedtime.  . metoprolol succinate (TOPROL XL) 25 MG 24 hr tablet Take 1 tablet (25 mg total) by mouth daily. (Patient not taking: Reported on 12/13/2015)  . potassium chloride SA (K-DUR,KLOR-CON) 20 MEQ tablet Take 1 tablet (20 mEq total) by mouth daily. (Patient not taking: Reported on 12/13/2015)  . Tamsulosin HCl (FLOMAX) 0.4 MG CAPS Take 0.4 mg by mouth daily.    . temazepam (RESTORIL) 15 MG capsule Take 15 mg by mouth at bedtime as needed for sleep.  . Thiamine HCl (VITAMIN B-1) 100 MG tablet Take 100 mg by mouth daily.  No facility-administered encounter medications on file as of 12/27/2015.   ALLERGIES: Allergies  Allergen Reactions  . Sulfa Antibiotics    VACCINATION STATUS:  There is no immunization history on file for this patient.  Diabetes He presents for his follow-up diabetic visit. He has type 2 diabetes mellitus. There are no hypoglycemic associated symptoms. Pertinent negatives for hypoglycemia include no confusion, headaches, pallor or seizures. There are no diabetic associated symptoms. Pertinent negatives for diabetes include no chest pain, no fatigue, no polydipsia, no polyphagia, no polyuria and no weakness. There are no hypoglycemic  complications. Symptoms are stable. There are no diabetic complications. Risk factors for coronary artery disease include diabetes mellitus, hypertension and sedentary lifestyle. Current diabetic treatment includes insulin injections. He is compliant with treatment most of the time. His weight is stable. He is following a generally unhealthy diet. When asked about meal planning, he reported none. He has not had a previous visit with a dietitian. He never participates in exercise. His breakfast blood glucose range is generally 180-200 mg/dl. His lunch blood glucose range is generally 180-200 mg/dl. His dinner blood glucose range is generally 180-200 mg/dl. His overall blood glucose range is 180-200 mg/dl. An ACE inhibitor/angiotensin II receptor blocker is not being taken.  Hypertension This is a chronic problem. The current episode started more than 1 year ago. Pertinent negatives include no chest pain, headaches, neck pain, palpitations or shortness of breath. Risk factors for coronary artery disease include diabetes mellitus, male gender and sedentary lifestyle. Past treatments include diuretics.     Review of Systems  Constitutional: Negative for fatigue and unexpected weight change.  HENT: Negative for dental problem, mouth sores and trouble swallowing.   Eyes: Negative for visual disturbance.  Respiratory: Negative for cough, choking, chest tightness, shortness of breath and wheezing.   Cardiovascular: Negative for chest pain, palpitations and leg swelling.  Gastrointestinal: Negative for nausea, vomiting, abdominal pain, diarrhea, constipation and abdominal distention.  Endocrine: Negative for polydipsia, polyphagia and polyuria.  Genitourinary: Negative for dysuria, urgency, hematuria and flank pain.  Musculoskeletal: Positive for gait problem. Negative for myalgias, back pain and neck pain.       Walks with a walker.  Skin: Negative for pallor, rash and wound.  Neurological: Negative for  seizures, syncope, weakness, numbness and headaches.  Psychiatric/Behavioral: Negative.  Negative for confusion and dysphoric mood.    Objective:    BP 136/73 mmHg  Pulse 78  Ht 5\' 6"  (1.676 m)  Wt 151 lb (68.493 kg)  BMI 24.38 kg/m2  SpO2 99%  Wt Readings from Last 3 Encounters:  12/27/15 151 lb (68.493 kg)  12/09/15 165 lb 9.6 oz (75.116 kg)  12/04/15 165 lb 2 oz (74.9 kg)    Physical Exam  Constitutional: He is oriented to person, place, and time. He appears well-developed and well-nourished. He is cooperative. No distress.  Uses a walker.   HENT:  Head: Normocephalic and atraumatic.  Eyes: EOM are normal.  Neck: Normal range of motion. Neck supple. No tracheal deviation present. No thyromegaly present.  Cardiovascular: Normal rate, S1 normal, S2 normal and normal heart sounds.  Exam reveals no gallop.   No murmur heard. Pulses:      Dorsalis pedis pulses are 1+ on the right side, and 1+ on the left side.       Posterior tibial pulses are 1+ on the right side, and 1+ on the left side.  Pulmonary/Chest: Breath sounds normal. No respiratory distress. He has no wheezes.  Abdominal:  Soft. Bowel sounds are normal. He exhibits no distension. There is no tenderness. There is no guarding and no CVA tenderness.  Musculoskeletal: He exhibits no edema.       Right shoulder: He exhibits no swelling and no deformity.  Neurological: He is alert and oriented to person, place, and time. He has normal strength and normal reflexes. A sensory deficit is present. No cranial nerve deficit. Gait normal.  Skin: Skin is warm and dry. No rash noted. No cyanosis. Nails show no clubbing.  Psychiatric: He has a normal mood and affect. His speech is normal and behavior is normal. Judgment and thought content normal. Cognition and memory are normal.     Complete Blood Count (Most recent): Lab Results  Component Value Date   WBC 9.3 12/07/2015   HGB 12.0* 12/07/2015   HCT 35.8* 12/07/2015   MCV  88.2 12/07/2015   PLT 155 12/07/2015   Chemistry (most recent): Lab Results  Component Value Date   NA 144 12/19/2015   K 3.9 12/19/2015   CL 100 12/19/2015   CO2 31 12/19/2015   BUN 55* 12/19/2015   CREATININE 1.49* 12/19/2015   Diabetic Labs (most recent): Lab Results  Component Value Date   HGBA1C 8.5* 12/19/2015   HGBA1C 8.4* 12/07/2015   HGBA1C 8.7* 11/30/2015    Assessment & Plan:   1. Diabetes mellitus without complication (HCC) - patient remains at a high risk for more acute and chronic complications of diabetes which include CAD, CVA, CKD, retinopathy, and neuropathy. These are all discussed in detail with the patient and his patient care technician.  Patient came with above target glucose profile, and  recent A1c of 8.5 %.  Glucose logs and insulin administration records pertaining to this visit,  to be scanned into patient's records.  Recent labs reviewed.   - I have re-counseled the patient on diet management   by adopting a carbohydrate restricted / protein rich  Diet.  - Suggestion is made for patient to avoid simple carbohydrates   from their diet including Cakes , Desserts, Ice Cream,  Soda (  diet and regular) , Sweet Tea , Candies,  Chips, Cookies, Artificial Sweeteners,   and "Sugar-free" Products .  This will help patient to have stable blood glucose profile and potentially avoid unintended  Weight gain.  - Patient is advised to stick to a routine mealtimes to eat 3 meals  a day and avoid unnecessary snacks ( to snack only to correct hypoglycemia).  - I have approached patient with the following individualized plan to manage diabetes and patient agrees.  - I will continue Levemir 40 units qhs, Novolog  15 units TIDAC plus SSI.  - I advised for strict monitoring of glucose before meals and at bedtime. - He will continue on Tradjenta 5 mg po qday.  - Patient specific target  for A1c; LDL, HDL, Triglycerides, and  Waist Circumference were discussed in  detail.  2) BP/HTN: Uncontrolled, I have initiated hydrochlorothiazide 25 mg by mouth every morning.  3)  Weight/Diet: Carbohydrates information provided.  5) Chronic Care/Health Maintenance:  -Patient's care taker is advised to arrange for visit with Ophthalmology, Podiatrist at least yearly or according to recommendations, and advised to  stay away from smoking. I have recommended yearly flu vaccine and pneumonia vaccination at least every 5 years; moderate intensity exercise for up to 150 minutes weekly; and  sleep for at least 7 hours a day.  - 25 minutes of time was spent on  the care of this patient , 50% of which was applied for counseling on diabetes complications and their preventions.  - I advised patient to maintain close follow up with Laqueta Linden, MD for primary care needs.  Patient is asked to bring meter and  blood glucose logs during their next visit.   Follow up plan: -Return in about 3 months (around 03/28/2016) for diabetes, high blood pressure, follow up with pre-visit labs, meter, and logs.  Marquis Lunch, MD Phone: [phone]  Fax: [phone]   12/27/2015, 1:20 PM

## 2015-12-31 ENCOUNTER — Encounter (HOSPITAL_COMMUNITY): Payer: Self-pay | Admitting: *Deleted

## 2015-12-31 ENCOUNTER — Emergency Department (HOSPITAL_COMMUNITY): Payer: Medicare Other

## 2015-12-31 ENCOUNTER — Inpatient Hospital Stay (HOSPITAL_COMMUNITY)
Admission: EM | Admit: 2015-12-31 | Discharge: 2016-01-03 | DRG: 641 | Disposition: A | Discharge status: Skilled Nursing Facility | Payer: Medicare Other | Attending: Internal Medicine | Admitting: Internal Medicine

## 2015-12-31 DIAGNOSIS — Z950 Presence of cardiac pacemaker: Secondary | ICD-10-CM | POA: Diagnosis not present

## 2015-12-31 DIAGNOSIS — G20 Parkinson's disease: Secondary | ICD-10-CM | POA: Diagnosis present

## 2015-12-31 DIAGNOSIS — H548 Legal blindness, as defined in USA: Secondary | ICD-10-CM | POA: Diagnosis present

## 2015-12-31 DIAGNOSIS — N179 Acute kidney failure, unspecified: Secondary | ICD-10-CM | POA: Diagnosis present

## 2015-12-31 DIAGNOSIS — E1159 Type 2 diabetes mellitus with other circulatory complications: Secondary | ICD-10-CM | POA: Diagnosis present

## 2015-12-31 DIAGNOSIS — N289 Disorder of kidney and ureter, unspecified: Secondary | ICD-10-CM | POA: Diagnosis present

## 2015-12-31 DIAGNOSIS — F0390 Unspecified dementia without behavioral disturbance: Secondary | ICD-10-CM | POA: Diagnosis present

## 2015-12-31 DIAGNOSIS — Z9181 History of falling: Secondary | ICD-10-CM

## 2015-12-31 DIAGNOSIS — E1151 Type 2 diabetes mellitus with diabetic peripheral angiopathy without gangrene: Secondary | ICD-10-CM | POA: Diagnosis present

## 2015-12-31 DIAGNOSIS — I5042 Chronic combined systolic (congestive) and diastolic (congestive) heart failure: Secondary | ICD-10-CM | POA: Diagnosis present

## 2015-12-31 DIAGNOSIS — W19XXXD Unspecified fall, subsequent encounter: Secondary | ICD-10-CM | POA: Diagnosis not present

## 2015-12-31 DIAGNOSIS — Z7982 Long term (current) use of aspirin: Secondary | ICD-10-CM | POA: Diagnosis not present

## 2015-12-31 DIAGNOSIS — I129 Hypertensive chronic kidney disease with stage 1 through stage 4 chronic kidney disease, or unspecified chronic kidney disease: Secondary | ICD-10-CM | POA: Diagnosis present

## 2015-12-31 DIAGNOSIS — T502X5A Adverse effect of carbonic-anhydrase inhibitors, benzothiadiazides and other diuretics, initial encounter: Secondary | ICD-10-CM | POA: Diagnosis present

## 2015-12-31 DIAGNOSIS — I10 Essential (primary) hypertension: Secondary | ICD-10-CM | POA: Diagnosis not present

## 2015-12-31 DIAGNOSIS — R269 Unspecified abnormalities of gait and mobility: Secondary | ICD-10-CM

## 2015-12-31 DIAGNOSIS — R296 Repeated falls: Secondary | ICD-10-CM | POA: Diagnosis present

## 2015-12-31 DIAGNOSIS — N189 Chronic kidney disease, unspecified: Secondary | ICD-10-CM | POA: Diagnosis present

## 2015-12-31 DIAGNOSIS — R531 Weakness: Secondary | ICD-10-CM

## 2015-12-31 DIAGNOSIS — Z794 Long term (current) use of insulin: Secondary | ICD-10-CM | POA: Diagnosis not present

## 2015-12-31 DIAGNOSIS — Z87891 Personal history of nicotine dependence: Secondary | ICD-10-CM | POA: Diagnosis not present

## 2015-12-31 DIAGNOSIS — E869 Volume depletion, unspecified: Secondary | ICD-10-CM | POA: Diagnosis not present

## 2015-12-31 DIAGNOSIS — W19XXXA Unspecified fall, initial encounter: Secondary | ICD-10-CM | POA: Diagnosis present

## 2015-12-31 DIAGNOSIS — F329 Major depressive disorder, single episode, unspecified: Secondary | ICD-10-CM | POA: Diagnosis present

## 2015-12-31 HISTORY — DX: History of falling: Z91.81

## 2015-12-31 HISTORY — DX: Atrioventricular block, complete: I44.2

## 2015-12-31 HISTORY — DX: Anemia, unspecified: D64.9

## 2015-12-31 HISTORY — DX: Presence of cardiac pacemaker: Z95.0

## 2015-12-31 HISTORY — DX: Bradycardia, unspecified: R00.1

## 2015-12-31 HISTORY — DX: Heart failure, unspecified: I50.9

## 2015-12-31 HISTORY — DX: Legal blindness, as defined in USA: H54.8

## 2015-12-31 HISTORY — DX: Syncope and collapse: R55

## 2015-12-31 LAB — COMPREHENSIVE METABOLIC PANEL
ALBUMIN: 3.4 g/dL — AB (ref 3.5–5.0)
ALK PHOS: 69 U/L (ref 38–126)
ALT: 40 U/L (ref 17–63)
AST: 34 U/L (ref 15–41)
Anion gap: 13 (ref 5–15)
BILIRUBIN TOTAL: 1 mg/dL (ref 0.3–1.2)
BUN: 63 mg/dL — AB (ref 6–20)
CO2: 31 mmol/L (ref 22–32)
Calcium: 8.7 mg/dL — ABNORMAL LOW (ref 8.9–10.3)
Chloride: 98 mmol/L — ABNORMAL LOW (ref 101–111)
Creatinine, Ser: 2.33 mg/dL — ABNORMAL HIGH (ref 0.61–1.24)
GFR calc Af Amer: 29 mL/min — ABNORMAL LOW (ref >60)
GFR calc non Af Amer: 25 mL/min — ABNORMAL LOW (ref >60)
GLUCOSE: 316 mg/dL — AB (ref 65–99)
POTASSIUM: 4.5 mmol/L (ref 3.5–5.1)
Sodium: 142 mmol/L (ref 135–145)
TOTAL PROTEIN: 7.6 g/dL (ref 6.5–8.1)

## 2015-12-31 LAB — URINALYSIS, ROUTINE W REFLEX MICROSCOPIC
Bilirubin Urine: NEGATIVE
GLUCOSE, UA: NEGATIVE mg/dL
Ketones, ur: NEGATIVE mg/dL
Nitrite: NEGATIVE
PH: 6.5 (ref 5.0–8.0)
Protein, ur: 100 mg/dL — AB
SPECIFIC GRAVITY, URINE: 1.01 (ref 1.005–1.030)

## 2015-12-31 LAB — URINE MICROSCOPIC-ADD ON: Squamous Epithelial / LPF: NONE SEEN

## 2015-12-31 LAB — CBC WITH DIFFERENTIAL/PLATELET
BASOS ABS: 0 10*3/uL (ref 0.0–0.1)
Basophils Relative: 0 %
Eosinophils Absolute: 0.1 10*3/uL (ref 0.0–0.7)
Eosinophils Relative: 1 %
HEMATOCRIT: 43.6 % (ref 39.0–52.0)
Hemoglobin: 13.7 g/dL (ref 13.0–17.0)
LYMPHS PCT: 26 %
Lymphs Abs: 2.4 10*3/uL (ref 0.7–4.0)
MCH: 28 pg (ref 26.0–34.0)
MCHC: 31.4 g/dL (ref 30.0–36.0)
MCV: 89.2 fL (ref 78.0–100.0)
MONO ABS: 0.9 10*3/uL (ref 0.1–1.0)
Monocytes Relative: 10 %
NEUTROS ABS: 5.9 10*3/uL (ref 1.7–7.7)
Neutrophils Relative %: 63 %
Platelets: 165 10*3/uL (ref 150–400)
RBC: 4.89 MIL/uL (ref 4.22–5.81)
RDW: 13 % (ref 11.5–15.5)
WBC: 9.3 10*3/uL (ref 4.0–10.5)

## 2015-12-31 LAB — GLUCOSE, CAPILLARY: GLUCOSE-CAPILLARY: 258 mg/dL — AB (ref 65–99)

## 2015-12-31 LAB — TROPONIN I: Troponin I: 0.03 ng/mL (ref <0.031)

## 2015-12-31 LAB — BRAIN NATRIURETIC PEPTIDE: B NATRIURETIC PEPTIDE 5: 73 pg/mL (ref 0.0–100.0)

## 2015-12-31 LAB — LACTIC ACID, PLASMA
Lactic Acid, Venous: 1.9 mmol/L (ref 0.5–2.0)
Lactic Acid, Venous: 1.4 mmol/L (ref 0.5–2.0)

## 2015-12-31 MED ORDER — TAMSULOSIN HCL 0.4 MG PO CAPS
0.4000 mg | ORAL_CAPSULE | Freq: Every day | ORAL | Status: DC
  Administered 2016-01-01 – 2016-01-03 (×3): 0.4 mg via ORAL
  Filled 2015-12-31 (×3): qty 1

## 2015-12-31 MED ORDER — INSULIN ASPART 100 UNIT/ML ~~LOC~~ SOLN
0.0000 [IU] | Freq: Three times a day (TID) | SUBCUTANEOUS | Status: DC
  Administered 2016-01-01 (×2): 3 [IU] via SUBCUTANEOUS
  Administered 2016-01-01: 5 [IU] via SUBCUTANEOUS
  Administered 2016-01-02 – 2016-01-03 (×2): 3 [IU] via SUBCUTANEOUS

## 2015-12-31 MED ORDER — ACETAMINOPHEN 650 MG RE SUPP: 650.0000 mg | Freq: Four times a day (QID) | RECTAL | Status: DC | PRN

## 2015-12-31 MED ORDER — INSULIN DETEMIR 100 UNIT/ML ~~LOC~~ SOLN
40.0000 [IU] | Freq: Every day | SUBCUTANEOUS | Status: DC
  Administered 2015-12-31 – 2016-01-02 (×3): 40 [IU] via SUBCUTANEOUS
  Filled 2015-12-31 (×4): qty 0.4

## 2015-12-31 MED ORDER — INSULIN DETEMIR 100 UNIT/ML ~~LOC~~ SOLN
SUBCUTANEOUS | Status: AC
Start: 2015-12-31 — End: 2015-12-31
  Filled 2015-12-31: qty 1

## 2015-12-31 MED ORDER — SODIUM CHLORIDE 0.9 % IV SOLN: INTRAVENOUS | Status: DC

## 2015-12-31 MED ORDER — POTASSIUM CHLORIDE CRYS ER 20 MEQ PO TBCR
20.0000 meq | EXTENDED_RELEASE_TABLET | Freq: Every day | ORAL | Status: DC
  Administered 2015-12-31 – 2016-01-03 (×4): 20 meq via ORAL
  Filled 2015-12-31 (×4): qty 1

## 2015-12-31 MED ORDER — ENOXAPARIN SODIUM 30 MG/0.3ML ~~LOC~~ SOLN
30.0000 mg | SUBCUTANEOUS | Status: DC
  Administered 2015-12-31 – 2016-01-01 (×2): 30 mg via SUBCUTANEOUS
  Filled 2015-12-31 (×2): qty 0.3

## 2015-12-31 MED ORDER — SODIUM CHLORIDE 0.9 % IV BOLUS (SEPSIS)
500.0000 mL | Freq: Once | INTRAVENOUS | Status: AC
  Administered 2015-12-31: 500 mL via INTRAVENOUS

## 2015-12-31 MED ORDER — LINAGLIPTIN 5 MG PO TABS
5.0000 mg | ORAL_TABLET | Freq: Every day | ORAL | Status: DC
  Administered 2016-01-01 – 2016-01-03 (×3): 5 mg via ORAL
  Filled 2015-12-31 (×3): qty 1

## 2015-12-31 MED ORDER — ACETAMINOPHEN 325 MG PO TABS: 650.0000 mg | ORAL_TABLET | Freq: Four times a day (QID) | ORAL | Status: DC | PRN

## 2015-12-31 MED ORDER — BISACODYL 10 MG RE SUPP
10.0000 mg | Freq: Every day | RECTAL | Status: DC | PRN
Start: 2015-12-31 — End: 2016-01-03

## 2015-12-31 MED ORDER — ASPIRIN EC 81 MG PO TBEC
81.0000 mg | DELAYED_RELEASE_TABLET | Freq: Every day | ORAL | Status: DC
  Administered 2016-01-01 – 2016-01-03 (×3): 81 mg via ORAL
  Filled 2015-12-31 (×3): qty 1

## 2015-12-31 MED ORDER — ONDANSETRON HCL 4 MG/2ML IJ SOLN: 4.0000 mg | Freq: Four times a day (QID) | INTRAMUSCULAR | Status: DC | PRN

## 2015-12-31 MED ORDER — METOPROLOL SUCCINATE ER 25 MG PO TB24
25.0000 mg | ORAL_TABLET | Freq: Every day | ORAL | Status: DC
  Administered 2016-01-01 – 2016-01-03 (×3): 25 mg via ORAL
  Filled 2015-12-31 (×3): qty 1

## 2015-12-31 MED ORDER — POLYETHYLENE GLYCOL 3350 17 G PO PACK: 17.0000 g | PACK | Freq: Every day | ORAL | Status: DC | PRN

## 2015-12-31 MED ORDER — VITAMIN B-1 100 MG PO TABS
100.0000 mg | ORAL_TABLET | Freq: Every day | ORAL | Status: DC
Start: 2016-01-01 — End: 2016-01-03
  Administered 2016-01-01 – 2016-01-03 (×3): 100 mg via ORAL
  Filled 2015-12-31 (×3): qty 1

## 2015-12-31 MED ORDER — SODIUM CHLORIDE 0.45 % IV SOLN
INTRAVENOUS | Status: DC
  Administered 2015-12-31 – 2016-01-01 (×3): via INTRAVENOUS
  Administered 2016-01-02: 1000 mL via INTRAVENOUS

## 2015-12-31 MED ORDER — DONEPEZIL HCL 5 MG PO TABS
5.0000 mg | ORAL_TABLET | Freq: Every day | ORAL | Status: DC
  Administered 2015-12-31 – 2016-01-02 (×3): 5 mg via ORAL
  Filled 2015-12-31 (×3): qty 1

## 2015-12-31 MED ORDER — INSULIN ASPART 100 UNIT/ML ~~LOC~~ SOLN
0.0000 [IU] | Freq: Every day | SUBCUTANEOUS | Status: DC
  Administered 2015-12-31: 3 [IU] via SUBCUTANEOUS
  Administered 2016-01-01: 2 [IU] via SUBCUTANEOUS

## 2015-12-31 MED ORDER — ESCITALOPRAM OXALATE 10 MG PO TABS
10.0000 mg | ORAL_TABLET | Freq: Every day | ORAL | Status: DC
  Administered 2016-01-01 – 2016-01-03 (×3): 10 mg via ORAL
  Filled 2015-12-31 (×3): qty 1

## 2015-12-31 MED ORDER — ONDANSETRON HCL 4 MG PO TABS: 4.0000 mg | ORAL_TABLET | Freq: Four times a day (QID) | ORAL | Status: DC | PRN

## 2015-12-31 NOTE — ED Notes (Signed)
Pt couldn't hardly stand long enough to do bp standing up. Very weak not hardly able to stand.

## 2015-12-31 NOTE — ED Provider Notes (Signed)
CSN: [id number]     Arrival date & time 12/31/15  1129 History   First MD Initiated Contact with Patient 12/31/15 1209     Chief Complaint  Patient presents with  . Cough  . Fall      Patient is a 82 y.o. male presenting with cough and fall. The history is provided by the patient, the nursing home and the EMS personnel. The history is limited by the condition of the patient (Hx dementia).  Cough Fall  Pt was seen at 1220. Per EMS, NH report and pt: NH sent pt to the ED for evaluation of "low oxygen levels when he walks" and "increased number of falls in the last week." Facility staff cannot give any further information or clarification regarding above complaints. Pt himself has hx of dementia and states he is "ok." Denies any complaints.    Past Medical History  Diagnosis Date  . Essential hypertension, benign   . Type 2 diabetes mellitus (HCC)   . Dementia   . Parkinson's disease   . Insomnia   . Benign prostate hyperplasia   . CHF (congestive heart failure) (HCC)   . Anemia   . Symptomatic bradycardia   . Syncope   . Complete AV block (HCC)   . Legally blind   . At high risk for falls 11/2014    Hx falls  . Pacemaker 12/01/2015    [employer]   Past Surgical History  Procedure Laterality Date  . Cardiac catheterization N/A 11/30/2015    Procedure: Temporary Pacemaker;  Surgeon: Orpah CobbAjay Kadakia, MD;  Location: [facility];  Service: Cardiovascular;  Laterality: N/A;  . Ep implantable device N/A 12/01/2015    Procedure: Pacemaker Implant;  Surgeon: Duke SalviaSteven C Klein, MD;  [facility];  Service: Cardiovascular;  Laterality: N/A;   Family History  Problem Relation Age of Onset  . Family history unknown: Yes   Social History  Substance Use Topics  . Smoking status: Former Smoker -- 0.50 packs/day for 4 years    Types: Cigarettes  . Smokeless tobacco: Never Used  . Alcohol Use: No    Review of Systems  Unable to perform ROS: Dementia  Respiratory:  Positive for cough.       Allergies  Sulfa antibiotics  Home Medications   Prior to Admission medications   Medication Sig Start Date End Date Taking? Authorizing Provider  aspirin EC 81 MG tablet Take 81 mg by mouth daily.     Yes Historical Provider, MD  Camphor-Eucalyptus-Menthol (MEDICATED CHEST RUB EX) Apply 1 application topically 2 (two) times daily.   Yes Historical Provider, MD  clotrimazole-betamethasone (LOTRISONE) cream Apply 1 application topically 2 (two) times daily as needed (Applied to scrotum area in a small amount as needed for rash). Reported on 12/13/2015   Yes Historical Provider, MD  escitalopram (LEXAPRO) 10 MG tablet Take 10 mg by mouth daily.   Yes Historical Provider, MD  fluticasone (FLONASE) 50 MCG/ACT nasal spray Place 1 spray into both nostrils daily.    Yes Historical Provider, MD  furosemide (LASIX) 40 MG tablet Take 1 tablet (40 mg total) by mouth daily. 12/09/15  Yes Erick BlinksJehanzeb Memon, MD  haloperidol (HALDOL) 1 MG tablet Take 1 mg by mouth at bedtime.     Yes Historical Provider, MD  hydrochlorothiazide (HYDRODIURIL) 25 MG tablet Take 25 mg by mouth daily.   Yes Historical Provider, MD  hydrOXYzine (ATARAX/VISTARIL) 25 MG tablet Take 25 mg by mouth every 6 (  six) hours as needed.   Yes Historical Provider, MD  insulin detemir (LEVEMIR) 100 UNIT/ML injection Inject 0.4 mLs (40 Units total) into the skin at bedtime. 09/28/15  Yes Roma Kayser, MD  insulin lispro (HUMALOG) 100 UNIT/ML injection Inject 15 Units into the skin 3 (three) times daily before meals. Give if blood sugars are over 150   Yes Historical Provider, MD  linagliptin (TRADJENTA) 5 MG TABS tablet Take 5 mg by mouth daily.   Yes Historical Provider, MD  lisinopril (PRINIVIL,ZESTRIL) 2.5 MG tablet Take 1 tablet (2.5 mg total) by mouth daily. 12/09/15  Yes Erick Blinks, MD  loratadine (CLARITIN) 10 MG tablet Take 10 mg by mouth daily.   Yes Historical Provider, MD  Melatonin 5 MG CAPS Take 10  mg by mouth at bedtime.   Yes Historical Provider, MD  metoprolol succinate (TOPROL XL) 25 MG 24 hr tablet Take 1 tablet (25 mg total) by mouth daily. 12/09/15  Yes Erick Blinks, MD  Tamsulosin HCl (FLOMAX) 0.4 MG CAPS Take 0.4 mg by mouth daily.     Yes Historical Provider, MD  temazepam (RESTORIL) 15 MG capsule Take 15 mg by mouth at bedtime as needed for sleep.   Yes Historical Provider, MD  Thiamine HCl (VITAMIN B-1) 100 MG tablet Take 100 mg by mouth daily.     Yes Historical Provider, MD  potassium chloride SA (K-DUR,KLOR-CON) 20 MEQ tablet Take 1 tablet (20 mEq total) by mouth daily. Patient not taking: Reported on 12/13/2015 12/09/15   Erick Blinks, MD   BP 113/57 mmHg  Pulse 64  Temp(Src) 97.7 F (36.5 C) (Oral)  Resp 15  SpO2 100%   14:22 Orthostatic Vital Signs MH  Orthostatic Lying  - BP- Lying: 136/96 mmHg ; Pulse- Lying: 64  Orthostatic Sitting - BP- Sitting: 164/68 mmHg ; Pulse- Sitting: 62  Orthostatic Standing at 0 minutes - BP- Standing at 0 minutes: 156/78 mmHg ; Pulse- Standing at 0 minutes: 84       Physical Exam 1225: Physical examination:  Nursing notes reviewed; Vital signs and O2 SAT reviewed;  Constitutional: Well developed, Well nourished, In no acute distress; Head:  Normocephalic, atraumatic; Eyes: EOMI, PERRL, No scleral icterus; ENMT: Mouth and pharynx normal, Mucous membranes dry; Neck: Supple, Full range of motion, No lymphadenopathy; Cardiovascular: Regular rate and rhythm, No gallop; Respiratory: Breath sounds clear & equal bilaterally, No wheezes.  Speaking full sentences with ease, Normal respiratory effort/excursion; Chest: Nontender, Movement normal; Abdomen: Soft, Nontender, Nondistended, Normal bowel sounds; Genitourinary: No CVA tenderness; Extremities: Pulses normal, No tenderness, No edema, No calf edema or asymmetry.; Neuro: Awake, alert, confused per hx dementia. Speech clear. Moves all extremities spontaneously and to command without apparent  gross focal motor deficits.; Skin: Color normal, Warm, Dry.   ED Course  Procedures (including critical care time) Labs Review  Imaging Review I have personally reviewed and evaluated these images and lab results as part of my medical decision-making.   EKG Interpretation   Date/Time:  Sunday December 31 2015 12:53:57 EDT Ventricular Rate:  62 PR Interval:  189 QRS Duration: 163 QT Interval:  467 QTC Calculation: 474 R Axis:   -86 Text Interpretation:  Atrial-ventricular dual-paced rhythm No further  analysis attempted due to paced rhythm Baseline wander When compared with  ECG of 12/08/2015 No significant change was found Confirmed by [facility]  MD,  Nicholos Johns [phone]) on 12/31/2015 12:57:14 PM       MDM  MDM Reviewed: previous chart, nursing note and  vitals Reviewed previous: labs and ECG Interpretation: labs, ECG, x-ray and CT scan      Results for orders placed or performed during the hospital encounter of 12/31/15  Comprehensive metabolic panel  Result Value Ref Range   Sodium 142 135 - 145 mmol/L   Potassium 4.5 3.5 - 5.1 mmol/L   Chloride 98 (L) 101 - 111 mmol/L   CO2 31 22 - 32 mmol/L   Glucose, Bld 316 (H) 65 - 99 mg/dL   BUN 63 (H) 6 - 20 mg/dL   Creatinine, Ser 1.61 (H) 0.61 - 1.24 mg/dL   Calcium 8.7 (L) 8.9 - 10.3 mg/dL   Total Protein 7.6 6.5 - 8.1 g/dL   Albumin 3.4 (L) 3.5 - 5.0 g/dL   AST 34 15 - 41 U/L   ALT 40 17 - 63 U/L   Alkaline Phosphatase 69 38 - 126 U/L   Total Bilirubin 1.0 0.3 - 1.2 mg/dL   GFR calc non Af Amer 25 (L) >60 mL/min   GFR calc Af Amer 29 (L) >60 mL/min   Anion gap 13 5 - 15  Troponin I  Result Value Ref Range   Troponin I 0.03 <0.031 ng/mL  Lactic acid, plasma  Result Value Ref Range   Lactic Acid, Venous 1.9 0.5 - 2.0 mmol/L  Brain natriuretic peptide  Result Value Ref Range   B Natriuretic Peptide 73.0 0.0 - 100.0 pg/mL  CBC with Differential  Result Value Ref Range   WBC 9.3 4.0 - 10.5 K/uL   RBC 4.89 4.22 - 5.81  MIL/uL   Hemoglobin 13.7 13.0 - 17.0 g/dL   HCT 09.6 04.5 - 40.9 %   MCV 89.2 78.0 - 100.0 fL   MCH 28.0 26.0 - 34.0 pg   MCHC 31.4 30.0 - 36.0 g/dL   RDW 81.1 91.4 - 78.2 %   Platelets 165 150 - 400 K/uL   Neutrophils Relative % 63 %   Neutro Abs 5.9 1.7 - 7.7 K/uL   Lymphocytes Relative 26 %   Lymphs Abs 2.4 0.7 - 4.0 K/uL   Monocytes Relative 10 %   Monocytes Absolute 0.9 0.1 - 1.0 K/uL   Eosinophils Relative 1 %   Eosinophils Absolute 0.1 0.0 - 0.7 K/uL   Basophils Relative 0 %   Basophils Absolute 0.0 0.0 - 0.1 K/uL  Urinalysis, Routine w reflex microscopic  Result Value Ref Range   Color, Urine YELLOW YELLOW   APPearance HAZY (A) CLEAR   Specific Gravity, Urine 1.010 1.005 - 1.030   pH 6.5 5.0 - 8.0   Glucose, UA NEGATIVE NEGATIVE mg/dL   Hgb urine dipstick TRACE (A) NEGATIVE   Bilirubin Urine NEGATIVE NEGATIVE   Ketones, ur NEGATIVE NEGATIVE mg/dL   Protein, ur 956 (A) NEGATIVE mg/dL   Nitrite NEGATIVE NEGATIVE   Leukocytes, UA MODERATE (A) NEGATIVE  Urine microscopic-add on  Result Value Ref Range   Squamous Epithelial / LPF NONE SEEN NONE SEEN   WBC, UA 0-5 0 - 5 WBC/hpf   RBC / HPF 0-5 0 - 5 RBC/hpf   Bacteria, UA RARE (A) NONE SEEN   Dg Chest 2 View 12/31/2015  CLINICAL DATA:  Cough.  Falls. EXAM: CHEST  2 VIEW COMPARISON:  12/07/2015 chest radiograph. FINDINGS: Stable configuration of 2 lead left subclavian pacemaker. Stable cardiomediastinal silhouette with top-normal heart size. No pneumothorax. No pleural effusion. Stable elevation of the right hemidiaphragm with right basilar atelectasis. No pulmonary edema. No acute consolidative airspace disease. IMPRESSION: No  active cardiopulmonary disease. Stable elevation of the right hemidiaphragm with right basilar atelectasis. Electronically Signed   By: Delbert Phenix M.D.   On: 12/31/2015 14:01   Ct Head Wo Contrast 12/31/2015  CLINICAL DATA:  Increasing falls.  Parkinson' s disease. EXAM: CT HEAD WITHOUT CONTRAST  TECHNIQUE: Contiguous axial images were obtained from the base of the skull through the vertex without intravenous contrast. COMPARISON:  07/03/2015 FINDINGS: The brainstem, cerebellum, cerebral peduncles, thalami, basal ganglia, basilar cisterns, and ventricular system appear within normal limits. Periventricular white matter and corona radiata hypodensities favor chronic ischemic microvascular white matter disease. No intracranial hemorrhage, mass lesion, or acute CVA. Left globe prosthesis. Mild chronic ethmoid sinusitis. IMPRESSION: 1. No acute intracranial findings. 2. Periventricular white matter and corona radiata hypodensities favor chronic ischemic microvascular white matter disease. 3. Mild chronic ethmoid sinusitis. Electronically Signed   By: Gaylyn Rong M.D.   On: 12/31/2015 14:45    1550:  Pacer interrogated: no events per [employer] staff. BUN/Cr slowly elevating from baseline over the past several weeks; judicious IVF given. Pt barely able to stand with heavy assist for orthostatic VS; unable to ambulate. Will observation admit. T/C to [facility]. Arlean Hopping, case discussed, including:  HPI, pertinent PM/SHx, VS/PE, dx testing, ED course and treatment:  Agreeable to admit, requests to write temporary orders, obtain medical bed to team [facility].    Samuel Jester, DO 01/03/16 1831

## 2015-12-31 NOTE — ED Notes (Signed)
Pt states he does not normally sit and stand.  Unable to get orthostatics or ambulate.

## 2015-12-31 NOTE — ED Notes (Signed)
[facility], per facility member who is here, pt has been having low oxygen levels when he ambulates. He has had increased number of falls in the last week, facility member is able to give little information about this. She states he did not hit his head. Pt oxygen 100% in triage. NAD noted.

## 2015-12-31 NOTE — H&P (Addendum)
[facility] History and Physical  Cody CulverRussell E Lackman ZOX:[id number] DOB: 1933-04-06 DOA: 12/31/2015  Referring physician: Dr. Clarene DukeMcManus  ED MD [facility] PCP: Laqueta LindenKONESWARAN, SURESH A, MD   Chief Complaint: Weak, unable to stand, falling at SNF  HPI: Cody [name] is a 82 y.o. 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 Sent here from SNF for increased falls, sluggish, can't get up with PT like he used to.  Over the past 1-2 weeks. Medication review shows Restoril restarted on Feb 6th as prn but has been getting every night since started except two nights. Also on scheduled SSRI, haldol and loratadine.    Patient responds to simple questions, poor historian.  No CP, cough, fever or abd pain.  "where's the food?".  No HA or blurred vision   Chart review: Feb 2016 > hx dementia/ BPH, DM and depression sent from SNF for cough/fevers, syncope, [place] hypoxemia. Flu +, rx Tamiflu, IV abx dc'd.  BC's negative, UA neg.  A1c 7.6%.  DC'd back to SNF after improving Feb 16- 19, 2017 > from SNF with N/V for 3 days, syncopal episode.  Per EMS had CHB, confused. Admitted > temp then PPM placed.  Parkinson's, hx recurrent syncope, hopeful that PPM will help with this.  AKI creat 2.0 > 1.3 at dc.  Baseline 1.2. HTN held all meds.  DM 1/2 dose levemir. Dementia.  NO working [employer] in chart. Feb 23- 25, 2017 > readmitted 4d after PPM admit for SOB/ dyspnea, ^^BNP, pulm 3edema.  A/C combined CHF. Rx IV lasix, improved changed to po lasix, on ACEi/ BB.  F/u cardiology.  Cont lexapro/ Haldol for dementia. Lasix is new for this pt this admission. LVEF 40-45%.  G1DD.  PAH 42 mm Hb, RV mildly dilted, LA severely dilated, severe MR.  Mod TR.     ROS  denies CP  no joint pain   no HA  no blurry vision  no rash  no diarrhea  no nausea/ vomiting  no dysuria  no difficulty voiding  no change in urine color    Where does patient live SNF Can patient participate in ADLs? Not very  well  Past Medical History  Past Medical History  Diagnosis Date  . Essential hypertension, benign   . Type 2 diabetes mellitus (HCC)   . Dementia   . Parkinson's disease   . Insomnia   . Benign prostate hyperplasia   . CHF (congestive heart failure) (HCC)   . Anemia   . Symptomatic bradycardia   . Syncope   . Complete AV block (HCC)   . Legally blind   . At high risk for falls 11/2014    Hx falls  . Pacemaker 12/01/2015    [employer]   Past Surgical History  Past Surgical History  Procedure Laterality Date  . Cardiac catheterization N/A 11/30/2015    Procedure: Temporary Pacemaker;  Surgeon: Orpah CobbAjay Kadakia, MD;  Location: [facility];  Service: Cardiovascular;  Laterality: N/A;  . Ep implantable device N/A 12/01/2015    Procedure: Pacemaker Implant;  Surgeon: Duke SalviaSteven C Klein, MD;  [facility];  Service: Cardiovascular;  Laterality: N/A;   Family History  Family History  Problem Relation Age of Onset  . Family history unknown: Yes   Social History  reports that he has quit smoking. His smoking use included Cigarettes. He has a 2 pack-year smoking history. He has never used smokeless tobacco. He  reports that he does not drink alcohol or use illicit drugs. Allergies  Allergies  Allergen Reactions  . Sulfa Antibiotics    Home medications Prior to Admission medications   Medication Sig Start Date End Date Taking? Authorizing Provider  aspirin EC 81 MG tablet Take 81 mg by mouth daily.     Yes Historical Provider, MD  Camphor-Eucalyptus-Menthol (MEDICATED CHEST RUB EX) Apply 1 application topically 2 (two) times daily.   Yes Historical Provider, MD  clotrimazole-betamethasone (LOTRISONE) cream Apply 1 application topically 2 (two) times daily as needed (Applied to scrotum area in a small amount as needed for rash). Reported on 12/13/2015   Yes Historical Provider, MD  escitalopram (LEXAPRO) 10 MG tablet Take 10 mg by mouth daily.   Yes Historical Provider, MD   fluticasone (FLONASE) 50 MCG/ACT nasal spray Place 1 spray into both nostrils daily.    Yes Historical Provider, MD  furosemide (LASIX) 40 MG tablet Take 1 tablet (40 mg total) by mouth daily. 12/09/15  Yes Erick Blinks, MD  haloperidol (HALDOL) 1 MG tablet Take 1 mg by mouth at bedtime.     Yes Historical Provider, MD  hydrochlorothiazide (HYDRODIURIL) 25 MG tablet Take 25 mg by mouth daily.   Yes Historical Provider, MD  hydrOXYzine (ATARAX/VISTARIL) 25 MG tablet Take 25 mg by mouth every 6 (six) hours as needed.   Yes Historical Provider, MD  insulin detemir (LEVEMIR) 100 UNIT/ML injection Inject 0.4 mLs (40 Units total) into the skin at bedtime. 09/28/15  Yes Roma Kayser, MD  insulin lispro (HUMALOG) 100 UNIT/ML injection Inject 15 Units into the skin 3 (three) times daily before meals. Give if blood sugars are over 150   Yes Historical Provider, MD  linagliptin (TRADJENTA) 5 MG TABS tablet Take 5 mg by mouth daily.   Yes Historical Provider, MD  lisinopril (PRINIVIL,ZESTRIL) 2.5 MG tablet Take 1 tablet (2.5 mg total) by mouth daily. 12/09/15  Yes Erick Blinks, MD  loratadine (CLARITIN) 10 MG tablet Take 10 mg by mouth daily.   Yes Historical Provider, MD  Melatonin 5 MG CAPS Take 10 mg by mouth at bedtime.   Yes Historical Provider, MD  metoprolol succinate (TOPROL XL) 25 MG 24 hr tablet Take 1 tablet (25 mg total) by mouth daily. 12/09/15  Yes Erick Blinks, MD  Tamsulosin HCl (FLOMAX) 0.4 MG CAPS Take 0.4 mg by mouth daily.     Yes Historical Provider, MD  temazepam (RESTORIL) 15 MG capsule Take 15 mg by mouth at bedtime as needed for sleep.   Yes Historical Provider, MD  Thiamine HCl (VITAMIN B-1) 100 MG tablet Take 100 mg by mouth daily.     Yes Historical Provider, MD  potassium chloride SA (K-DUR,KLOR-CON) 20 MEQ tablet Take 1 tablet (20 mEq total) by mouth daily. Patient not taking: Reported on 12/13/2015 12/09/15   Erick Blinks, MD   Liver Function Tests  Recent Labs Lab  12/31/15 1245  AST 34  ALT 40  ALKPHOS 69  BILITOT 1.0  PROT 7.6  ALBUMIN 3.4*   No results for input(s): LIPASE, AMYLASE in the last 168 hours. CBC  Recent Labs Lab 12/31/15 1245  WBC 9.3  NEUTROABS 5.9  HGB 13.7  HCT 43.6  MCV 89.2  PLT 165   Basic Metabolic Panel  Recent Labs Lab 12/31/15 1245  NA 142  K 4.5  CL 98*  CO2 31  GLUCOSE 316*  BUN 63*  CREATININE 2.33*  CALCIUM 8.7*     Filed  Vitals:   12/31/15 1230 12/31/15 1430 12/31/15 1500 12/31/15 1530  BP: 138/54 156/78 143/75 121/38  Pulse: 60 84 78 62  Temp:      TempSrc:      Resp:  SpO2: 94% 100% 95% 95%   Exam: Gen pleasant elderly WM, tired and weak appearing No rash, cyanosis or gangrene Sclera anicteric, throat clear and dry No jvd or bruits Chest clear bilat RRR no MRG Abd soft ntnd no mass or ascites +bs GU normal male MS no joint effusions or deformity Ext no edema / no wounds or ulcers on feet bilat Neuro is alert, oriented to "[place]", doesn't know town or year  EKG (independently reviewed) > A-V dual-paced rhythm, no other changes CXR (independently reviewed) > clear , chron elevated left diaphragm  Home medications: Restorial 15 qhs prn (has rec'd 9 of 12 last days) Hydroxyzine prn - not getting Zoloft 10 mg daily Haldol scheduled 1 mg daily Loratidine 10 mg daily Also > HCTZ 25/d, lasix 40/d, Levemir 40 u qhs, lisinopril 2.5/d, Metoprolol ER 25/d, asa 81, flomax, tradjenta /d, melatonin, vit B/other vitamins, steroid nasal spray, humalog SSI   Assessment: 1. Fatigue/gen weakness/ falls - CNS depression +/- vol depletion.  Just started on po lasix 40/ d last admission when here with a/c CHF.  Is on lasix and HCTZ which is not necessary, and 40 mg / d may need to be reduced at dc to 20/d.  Also CNS depression due to multiple CNS depressants. Side effects from restoril/ haldol high risk of CNS depression (fatigue/ weak/ falls, etc) along with SSRI.  DC haldol  and restoril, cont SSRI, start anticholinesterase (donepezil) for known dementia. Get PT to see.  2. Dementia moderate severity 3. DM 2 on levemir/ tradjenta/ SSI 4. HTN 5. Combined CHF - hold acei for now w ^creat, cont BB, hold diuretics, gentle fluids overnight 6. Complete HB s/p PPM Feb 2017 7. Depression - SSRI 8. Code status - briefly discussed EOL and pt said he would want to "go in peace" vs use machines to keep him alive.  No family available, all siblings passed away, never had children.  Get SW consult.  9. Acute / chron renal failure - due to overdiuresis most likely.  As #5 above.   Plan - as above   DVT Prophylaxis lovenox Code Status: full code for now but need reassessment/ family contact Family Communication: no family Disposition Plan: back to SNF when stable   Diane Hanel D [facility] Pager [phone]  Cell [phone]  If 7PM-7AM, please contact night-coverage [email or web address] Password [facility] 12/31/2015, 4:57 PM

## 2016-01-01 DIAGNOSIS — I10 Essential (primary) hypertension: Secondary | ICD-10-CM

## 2016-01-01 DIAGNOSIS — E1159 Type 2 diabetes mellitus with other circulatory complications: Secondary | ICD-10-CM

## 2016-01-01 DIAGNOSIS — N289 Disorder of kidney and ureter, unspecified: Secondary | ICD-10-CM

## 2016-01-01 DIAGNOSIS — R531 Weakness: Secondary | ICD-10-CM

## 2016-01-01 DIAGNOSIS — N189 Chronic kidney disease, unspecified: Secondary | ICD-10-CM

## 2016-01-01 LAB — GLUCOSE, CAPILLARY
GLUCOSE-CAPILLARY: 217 mg/dL — AB (ref 65–99)
Glucose-Capillary: 171 mg/dL — ABNORMAL HIGH (ref 65–99)
Glucose-Capillary: 174 mg/dL — ABNORMAL HIGH (ref 65–99)
Glucose-Capillary: 208 mg/dL — ABNORMAL HIGH (ref 65–99)

## 2016-01-01 LAB — BASIC METABOLIC PANEL
Anion gap: 10 (ref 5–15)
BUN: 55 mg/dL — AB (ref 6–20)
CALCIUM: 8.2 mg/dL — AB (ref 8.9–10.3)
CO2: 30 mmol/L (ref 22–32)
Chloride: 103 mmol/L (ref 101–111)
Creatinine, Ser: 1.57 mg/dL — ABNORMAL HIGH (ref 0.61–1.24)
GFR calc Af Amer: 46 mL/min — ABNORMAL LOW (ref >60)
GFR, EST NON AFRICAN AMERICAN: 40 mL/min — AB (ref >60)
GLUCOSE: 219 mg/dL — AB (ref 65–99)
POTASSIUM: 3.6 mmol/L (ref 3.5–5.1)
Sodium: 143 mmol/L (ref 135–145)

## 2016-01-01 LAB — CBC
HEMATOCRIT: 40.1 % (ref 39.0–52.0)
HEMOGLOBIN: 12.8 g/dL — AB (ref 13.0–17.0)
MCH: 28.1 pg (ref 26.0–34.0)
MCHC: 31.9 g/dL (ref 30.0–36.0)
MCV: 88.1 fL (ref 78.0–100.0)
Platelets: 179 10*3/uL (ref 150–400)
RBC: 4.55 MIL/uL (ref 4.22–5.81)
RDW: 12.8 % (ref 11.5–15.5)
WBC: 10.3 10*3/uL (ref 4.0–10.5)

## 2016-01-01 NOTE — Progress Notes (Signed)
PT Cancellation Note  Patient Details Name: Cody [name] E Viereck MRN: [id number] DOB: 27-Mar-1933   Cancelled Treatment:    Reason Eval/Treat Not Completed: Patient declined, no reason specified. Chart reviewed, RN consulted. Evaluation was attempted, pt received in fetal position asleep. Pt awakens easily, answers simple questioning with quick responses, and few words. Pt denies pain, but does endorse that he does not feel well when asked. Pt refuses OOB, is eventually agreeable to come to EOB which he does quickly and easily, without physical assistance. Pt endorses that he feels better since yesterday. He offers no additional reasoning for his disinterest in participating with PT. This interaction is somewhat consistent with past PT evaluations at this facility. Will attempt again at later date/time as appropriate.    3:42 PM, 01/01/2016 Rosamaria LintsAllan C Buccola, PT, DPT PRN [facility] The[facility]ist [facility] License # [id number] [phone] (wireless)  [phone] (mobile)

## 2016-01-01 NOTE — Progress Notes (Signed)
[facility] PROGRESS NOTE  Cody [name] ZOX:[id number] DOB: 07-08-1933    PCP:   Laqueta LindenKONESWARAN, SURESH A, MD   HPI: Cody [name] is an 82 y.o. male with hx of moderate dementia, assisted living resident, DM, CHF, falls, complete HB s/p ppm recently, admitted for weakness and fallings, and felt to be due to volume depletion and medications.  IVF was given; Haldol and restoril discontinued, and PT was consulted.  He did not want to participate in PT.  His EKG showed paced rhythm.  Serology showed improvement of his AKI with IVF.    Rewiew of Systems:  Constitutional: Negative for malaise, fever and chills. No significant weight loss or weight gain Eyes: Negative for eye pain, redness and discharge, diplopia, visual changes, or flashes of light. ENMT: Negative for ear pain, hoarseness, nasal congestion, sinus pressure and sore throat. No headaches; tinnitus, drooling, or problem swallowing. Cardiovascular: Negative for chest pain, palpitations, diaphoresis, dyspnea and peripheral edema. ; No orthopnea, PND Respiratory: Negative for cough, hemoptysis, wheezing and stridor. No pleuritic chestpain. Gastrointestinal: Negative for nausea, vomiting, diarrhea, constipation, abdominal pain, melena, blood in stool, hematemesis, jaundice and rectal bleeding.    Genitourinary: Negative for frequency, dysuria, incontinence,flank pain and hematuria; Musculoskeletal: Negative for back pain and neck pain. Negative for swelling and trauma.;  Skin: . Negative for pruritus, rash, abrasions, bruising and skin lesion.; ulcerations Neuro: Negative for headache, lightheadedness and neck stiffness. Negative for weakness, altered level of consciousness , altered mental status, extremity weakness, burning feet, involuntary movement, seizure and syncope.  Psych: negative for anxiety, depression, insomnia, tearfulness, panic attacks, hallucinations, paranoia, suicidal or homicidal ideation   Past Medical  History  Diagnosis Date  . Essential hypertension, benign   . Type 2 diabetes mellitus (HCC)   . Dementia   . Parkinson's disease   . Insomnia   . Benign prostate hyperplasia   . CHF (congestive heart failure) (HCC)   . Anemia   . Symptomatic bradycardia   . Syncope   . Complete AV block (HCC)   . Legally blind   . At high risk for falls 11/2014    Hx falls  . Pacemaker 12/01/2015    [employer]    Past Surgical History  Procedure Laterality Date  . Cardiac catheterization N/A 11/30/2015    Procedure: Temporary Pacemaker;  Surgeon: Orpah CobbAjay Kadakia, MD;  Location: [facility] INVASIVE CV LAB;  Service: Cardiovascular;  Laterality: N/A;  . Ep implantable device N/A 12/01/2015    Procedure: Pacemaker Implant;  Surgeon: Duke SalviaSteven C Klein, MD;  [facility];  Service: Cardiovascular;  Laterality: N/A;    Medications:  HOME MEDS: Prior to Admission medications   Medication Sig Start Date End Date Taking? Authorizing Provider  aspirin EC 81 MG tablet Take 81 mg by mouth daily.     Yes Historical Provider, MD  Camphor-Eucalyptus-Menthol (MEDICATED CHEST RUB EX) Apply 1 application topically 2 (two) times daily.   Yes Historical Provider, MD  clotrimazole-betamethasone (LOTRISONE) cream Apply 1 application topically 2 (two) times daily as needed (Applied to scrotum area in a small amount as needed for rash). Reported on 12/13/2015   Yes Historical Provider, MD  escitalopram (LEXAPRO) 10 MG tablet Take 10 mg by mouth daily.   Yes Historical Provider, MD  fluticasone (FLONASE) 50 MCG/ACT nasal spray Place 1 spray into both nostrils daily.    Yes Historical Provider, MD  furosemide (LASIX) 40 MG tablet Take 1 tablet (40 mg total) by mouth daily. 12/09/15  Yes Erick Blinks, MD  haloperidol (HALDOL) 1 MG tablet Take 1 mg by mouth at bedtime.     Yes Historical Provider, MD  hydrochlorothiazide (HYDRODIURIL) 25 MG tablet Take 25 mg by mouth daily.   Yes Historical Provider, MD  hydrOXYzine  (ATARAX/VISTARIL) 25 MG tablet Take 25 mg by mouth every 6 (six) hours as needed.   Yes Historical Provider, MD  insulin detemir (LEVEMIR) 100 UNIT/ML injection Inject 0.4 mLs (40 Units total) into the skin at bedtime. 09/28/15  Yes Roma Kayser, MD  insulin lispro (HUMALOG) 100 UNIT/ML injection Inject 15 Units into the skin 3 (three) times daily before meals. Give if blood sugars are over 150   Yes Historical Provider, MD  linagliptin (TRADJENTA) 5 MG TABS tablet Take 5 mg by mouth daily.   Yes Historical Provider, MD  lisinopril (PRINIVIL,ZESTRIL) 2.5 MG tablet Take 1 tablet (2.5 mg total) by mouth daily. 12/09/15  Yes Erick Blinks, MD  loratadine (CLARITIN) 10 MG tablet Take 10 mg by mouth daily.   Yes Historical Provider, MD  Melatonin 5 MG CAPS Take 10 mg by mouth at bedtime.   Yes Historical Provider, MD  metoprolol succinate (TOPROL XL) 25 MG 24 hr tablet Take 1 tablet (25 mg total) by mouth daily. 12/09/15  Yes Erick Blinks, MD  Tamsulosin HCl (FLOMAX) 0.4 MG CAPS Take 0.4 mg by mouth daily.     Yes Historical Provider, MD  temazepam (RESTORIL) 15 MG capsule Take 15 mg by mouth at bedtime as needed for sleep.   Yes Historical Provider, MD  Thiamine HCl (VITAMIN B-1) 100 MG tablet Take 100 mg by mouth daily.     Yes Historical Provider, MD  potassium chloride SA (K-DUR,KLOR-CON) 20 MEQ tablet Take 1 tablet (20 mEq total) by mouth daily. Patient not taking: Reported on 12/13/2015 12/09/15   Erick Blinks, MD     Allergies:  Allergies  Allergen Reactions  . Sulfa Antibiotics     Social History:   reports that he has quit smoking. His smoking use included Cigarettes. He has a 2 pack-year smoking history. He has never used smokeless tobacco. He reports that he does not drink alcohol or use illicit drugs.  Family History: Family History  Problem Relation Age of Onset  . Family history unknown: Yes     Physical Exam: Filed Vitals:   12/31/15 2010 12/31/15 2206 01/01/16  0621 01/01/16 1125  BP:  159/50 190/63   Pulse: 66 66 63   Temp: 97.8 F (36.6 C)  98.6 F (37 C)   TempSrc: Oral  Oral   Resp: 20  20   Height:  (1.676 m)     Weight: 65.3 kg (143 lb 15.4 oz)     SpO2: 99%  100% 93%   Blood pressure 190/63, pulse 63, temperature 98.6 F (37 C), temperature source Oral, resp. rate 20, height  (1.676 m), weight 65.3 kg (143 lb 15.4 oz), SpO2 93 %.  GEN:  Pleasant patient lying in the stretcher in no acute distress; cooperative with exam. PSYCH:  alert and oriented x4; does not appear anxious or depressed; affect is appropriate. HEENT: Mucous membranes pink and anicteric; PERRLA; EOM intact; no cervical lymphadenopathy nor thyromegaly or carotid bruit; no JVD; There were no stridor. Neck is very supple. Breasts:: Not examined CHEST WALL: No tenderness CHEST: Normal respiration, clear to auscultation bilaterally.  HEART: Regular rate and rhythm.  There are no murmur, rub, or gallops.   BACK: No kyphosis  or scoliosis; no CVA tenderness ABDOMEN: soft and non-tender; no masses, no organomegaly, normal abdominal bowel sounds; no pannus; no intertriginous candida. There is no rebound and no distention. Rectal Exam: Not done EXTREMITIES: No bone or joint deformity; age-appropriate arthropathy of the hands and knees; no edema; no ulcerations.  There is no calf tenderness. Genitalia: not examined PULSES: 2+ and symmetric SKIN: Normal hydration no rash or ulceration CNS: Cranial nerves 2-12 grossly intact no focal lateralizing neurologic deficit.  Speech is fluent; uvula elevated with phonation, facial symmetry and tongue midline. DTR are normal bilaterally, cerebella exam is intact, barbinski is negative and strengths are equaled bilaterally.  No sensory loss.   Labs on Admission:  Basic Metabolic Panel:  Recent Labs Lab 12/31/15 1245 01/01/16 0642  NA 142 143  K 4.5 3.6  CL 98* 103  CO2 31 30  GLUCOSE 316* 219*  BUN 63* 55*  CREATININE  2.33* 1.57*  CALCIUM 8.7* 8.2*   Liver Function Tests:  Recent Labs Lab 12/31/15 1245  AST 34  ALT 40  ALKPHOS 69  BILITOT 1.0  PROT 7.6  ALBUMIN 3.4*   CBC:  Recent Labs Lab 12/31/15 1245 01/01/16 0642  WBC 9.3 10.3  NEUTROABS 5.9  --   HGB 13.7 12.8*  HCT 43.6 40.1  MCV 89.2 88.1  PLT 165 179   Cardiac Enzymes:  Recent Labs Lab 12/31/15 1245  TROPONINI 0.03    CBG:  Recent Labs Lab 12/31/15 2101 01/01/16 0733 01/01/16 1211  GLUCAP 258* 171* 217*     Radiological Exams on Admission: Dg Chest 2 View  12/31/2015  CLINICAL DATA:  Cough.  Falls. EXAM: CHEST  2 VIEW COMPARISON:  12/07/2015 chest radiograph. FINDINGS: Stable configuration of 2 lead left subclavian pacemaker. Stable cardiomediastinal silhouette with top-normal heart size. No pneumothorax. No pleural effusion. Stable elevation of the right hemidiaphragm with right basilar atelectasis. No pulmonary edema. No acute consolidative airspace disease. IMPRESSION: No active cardiopulmonary disease. Stable elevation of the right hemidiaphragm with right basilar atelectasis. Electronically Signed   By: Delbert Phenix M.D.   On: 12/31/2015 14:01   Ct Head Wo Contrast  12/31/2015  CLINICAL DATA:  Increasing falls.  Parkinson' s disease. EXAM: CT HEAD WITHOUT CONTRAST TECHNIQUE: Contiguous axial images were obtained from the base of the skull through the vertex without intravenous contrast. COMPARISON:  07/03/2015 FINDINGS: The brainstem, cerebellum, cerebral peduncles, thalami, basal ganglia, basilar cisterns, and ventricular system appear within normal limits. Periventricular white matter and corona radiata hypodensities favor chronic ischemic microvascular white matter disease. No intracranial hemorrhage, mass lesion, or acute CVA. Left globe prosthesis. Mild chronic ethmoid sinusitis. IMPRESSION: 1. No acute intracranial findings. 2. Periventricular white matter and corona radiata hypodensities favor chronic  ischemic microvascular white matter disease. 3. Mild chronic ethmoid sinusitis. Electronically Signed   By: Gaylyn Rong M.D.   On: 12/31/2015 14:45    Assessment/Plan Present on Admission:  . Falls . Dementia . Essential hypertension, benign . DM type 2 causing vascular disease (HCC) . Volume depletion   PLAN:  Weakness, FTT;  I suspect multifactorial, including volume depletion, sedative meds, and depression not excluded. Will continue with med adjustment, PT, and likely will need HH upon discharge.   DEMENTIA:  Continue with aricept started by Dr Arlean Hopping, though I am not convinced it will help at this stage of his dementia.   HTN:  BP is elevated.  Will start Lisinopril 2.5 mg per day.  DM:  CBG slightly elevated.  Will  follow.   Other plans as per orders. Code Status: FULL Unk Lightning, MD.  FACP [facility] Pager [phone] 7pm to 7am.  01/01/2016, 4:53 PM

## 2016-01-01 NOTE — Progress Notes (Addendum)
LCSW aware of patient and currently from facility: [facility]. Due to patient's current medical status and weakness as documented, PT has been ordered to evaluate if patient needs higher level of care. [facility] reports he recently had a new pacemaker placed (one month ago) and they have noticed he had decreased with walking/struggled more. Baseline for pt is he walks everyday with his walker.  [facility] feels [facility]  Pending PT consult. Awaiting review and evaluation from PT then will assess patient and needs. Will follow up and follow acutely while in hospital.  Deretha EmoryHannah Kinshasa Throckmorton LCSW, MSW Clinical Social Work: Emergency Room [phone]

## 2016-01-02 DIAGNOSIS — W19XXXD Unspecified fall, subsequent encounter: Secondary | ICD-10-CM

## 2016-01-02 DIAGNOSIS — F0390 Unspecified dementia without behavioral disturbance: Secondary | ICD-10-CM

## 2016-01-02 LAB — GLUCOSE, CAPILLARY
GLUCOSE-CAPILLARY: 90 mg/dL (ref 65–99)
GLUCOSE-CAPILLARY: 79 mg/dL (ref 65–99)
Glucose-Capillary: 99 mg/dL (ref 65–99)
Glucose-Capillary: 183 mg/dL — ABNORMAL HIGH (ref 65–99)

## 2016-01-02 MED ORDER — ENOXAPARIN SODIUM 40 MG/0.4ML ~~LOC~~ SOLN
40.0000 mg | SUBCUTANEOUS | Status: DC
  Administered 2016-01-02: 40 mg via SUBCUTANEOUS
  Filled 2016-01-02: qty 0.4

## 2016-01-02 MED ORDER — DONEPEZIL HCL 5 MG PO TABS: 5.0000 mg | ORAL_TABLET | Freq: Every day | ORAL | Status: AC

## 2016-01-02 NOTE — Clinical Social Work Note (Signed)
Clinical Social Work Assessment  Patient Details  Name: Cody [name] MRN: [id number] Date of Birth: February 03, 1933  Date of referral:  01/02/16               Reason for consult:  Other (Comment Required) (from ALF)                Permission sought to share information with:  Case Manager, Customer service manager, Family Supports Permission granted to share information::  Yes, Verbal Permission Granted  Name::        Agency::  [facility], arranging [place]  Relationship::     Contact Information:     Housing/Transportation Living arrangements for the past 2 months:  [place] of Information:  Patient, Medical Team, Facility Patient Interpreter Needed:  None Criminal Activity/Legal Involvement Pertinent to Current Situation/Hospitalization:  No - Comment as needed Significant Relationships:  None Lives with:  Self, Facility Resident Do you feel safe going back to the place where you live?  Yes (with HH to increase ADLs) Need for family participation in patient care:  No (Coment)  Care giving concerns:  Patient per facility has become more unsteady on his feet since his pacemaker was placed. He currently is indpendent at facility using DME Walker to get around.  No other care giving concerns.  PT saw patient and feels he is able to return with [facility].  All has been arranged for patient to return with additional medical support and PT at facility.   Social Worker assessment / plan:  LCSW met with patient, reviewed PT recommendations and spoke to facility. No barriers with return. Additional information placed on FL2 for patient to have [place] at facility. NO contacts in computer for patient and per [facility] none at facility. Patient aware of DC back to facility and agreeable.    FL2 updated. [facility] will come and pick patient up once medically stable.  Employment status:  Retired Nurse, adult, Medicaid In [place] PT Recommendations:   Home with [place] / Referral to community resources:  Other (Comment Required) [facility] referral)  Patient/Family's Response to care:  Agreeable to plan  Patient/Family's Understanding of and Emotional Response to Diagnosis, Current Treatment, and Prognosis:  Patient agreeable to plan, aware of current medical condition and prognosis. At times can be defiant when asked to move or transfer, but for the most part he is adaptable and cooperative in treatment.  Emotional Assessment Appearance:  Appears stated age Attitude/Demeanor/Rapport:  Other (cooperative) Affect (typically observed):  Accepting, Adaptable Orientation:  Oriented to Self, Oriented to Place Alcohol / Substance use:  Not Applicable Psych involvement (Current and /or in the community):  No (Comment)  Discharge Needs  Concerns to be addressed:  No discharge needs identified Readmission within the last 30 days:  No Current discharge risk:  None Barriers to Discharge:  No Barriers Identified, Continued Medical Work up   [place], LCSW 01/02/2016, 9:30 AM

## 2016-01-02 NOTE — [place] FL2 (Signed)
MEDICAID FL2 LEVEL OF CARE SCREENING TOOL     IDENTIFICATION  Patient Name: Cody [name] Birthdate: 16-Nov-1932 Sex: male Admission Date (Current Location): 12/31/2015  [facility] [place]   Facility and Address:  [facility],  [address], [place] [id number]      Provider Number: [id number]  Attending Physician Name and Address:  Houston SirenPeter Le, MD  Relative Name and Phone Number:       Current Level of Care: Hospital Recommended Level of Care: Assisted Living Facility Prior Approval Number:    Date Approved/Denied:   PASRR Number:    Discharge Plan: Other (Comment) (Return to ALF)    Current Diagnoses: Patient Active Problem List   Diagnosis Date Noted  . Falls 12/31/2015  . Generalized weakness 12/31/2015  . Acute on chronic renal insufficiency (HCC) 12/31/2015  . Gait disturbance 12/31/2015  . Volume depletion 12/31/2015  . CHF (congestive heart failure) (HCC) 12/08/2015  . Acute on chronic combined systolic and diastolic CHF (congestive heart failure) (HCC) 12/08/2015  . Normocytic anemia   . Atrioventricular block, complete (HCC) 11/30/2015  . Complete heart block (HCC)   . Essential hypertension, benign 09/28/2015  . Syncope 12/05/2014  . BPH (benign prostatic hyperplasia) 12/05/2014  . Dementia 12/05/2014  . DM type 2 causing vascular disease (HCC) 12/05/2014    Orientation RESPIRATION BLADDER Height & Weight     Self, Situation, Place  Normal Incontinent Weight: 144 lb (65.318 kg) Height:  5\' 6"  (167.6 cm)  BEHAVIORAL SYMPTOMS/MOOD NEUROLOGICAL BOWEL NUTRITION STATUS  Other (Comment) (none)   Incontinent Diet (regular)  AMBULATORY STATUS COMMUNICATION OF NEEDS Skin   Limited Assist (use with walker) Verbally Normal                       Personal Care Assistance Level of Assistance  Bathing, Feeding, Dressing Bathing Assistance: Limited assistance Feeding assistance: Limited assistance Dressing  Assistance: Limited assistance     Functional Limitations Info  Sight, Hearing, Speech Sight Info: Adequate Hearing Info: Adequate Speech Info: Adequate    SPECIAL CARE FACTORS FREQUENCY  PT (By licensed PT)     PT Frequency: 3x  (HH)              Contractures Contractures Info: Not present    Additional Factors Info  Code Status, Allergies, Psychotropic, Insulin Sliding Scale, Isolation Precautions Code Status Info: Full code Allergies Info: Sulfa Antibiotics  Psychotropic Info: Lexapro Insulin Sliding Scale Info: 3x with meals  1x at bedtime Isolation Precautions Info: none     Current Medications (01/02/2016):  This is the current hospital active medication list Current Facility-Administered Medications  Medication Dose Route Frequency Provider Last Rate Last Dose  . 0.45 % sodium chloride infusion   Intravenous Continuous Delano Metzobert Schertz, MD 75 mL/hr at 01/01/16 2338    . acetaminophen (TYLENOL) tablet 650 mg  650 mg Oral Q6H PRN Delano Metzobert Schertz, MD       Or  . acetaminophen (TYLENOL) suppository 650 mg  650 mg Rectal Q6H PRN Delano Metzobert Schertz, MD      . aspirin EC tablet 81 mg  81 mg Oral Daily Delano Metzobert Schertz, MD   81 mg at 01/01/16 1007  . bisacodyl (DULCOLAX) suppository 10 mg  10 mg Rectal Daily PRN Delano Metzobert Schertz, MD      . donepezil (ARICEPT) tablet 5 mg  5 mg Oral QHS Delano Metzobert Schertz, MD   5 mg at 01/01/16 2217  .  enoxaparin (LOVENOX) injection 30 mg  30 mg Subcutaneous Q24H Delano Metz, MD   30 mg at 01/01/16 2216  . escitalopram (LEXAPRO) tablet 10 mg  10 mg Oral Daily Delano Metz, MD   10 mg at 01/01/16 1007  . insulin aspart (novoLOG) injection 0-15 Units  0-15 Units Subcutaneous TID WC Delano Metz, MD   3 Units at 01/01/16 1836  . insulin aspart (novoLOG) injection 0-5 Units  0-5 Units Subcutaneous QHS Delano Metz, MD   2 Units at 01/01/16 2216  . insulin detemir (LEVEMIR) injection 40 Units  40 Units Subcutaneous QHS Delano Metz, MD   40 Units at  01/01/16 2215  . linagliptin (TRADJENTA) tablet 5 mg  5 mg Oral Daily Delano Metz, MD   5 mg at 01/01/16 1006  . metoprolol succinate (TOPROL-XL) 24 hr tablet 25 mg  25 mg Oral Daily Delano Metz, MD   25 mg at 01/01/16 1007  . ondansetron (ZOFRAN) tablet 4 mg  4 mg Oral Q6H PRN Delano Metz, MD       Or  . ondansetron [facility]) injection 4 mg  4 mg Intravenous Q6H PRN Delano Metz, MD      . polyethylene glycol (MIRALAX / GLYCOLAX) packet 17 g  17 g Oral Daily PRN Delano Metz, MD      . potassium chloride SA (K-DUR,KLOR-CON) CR tablet 20 mEq  20 mEq Oral Daily Delano Metz, MD   20 mEq at 01/01/16 1006  . tamsulosin (FLOMAX) capsule 0.4 mg  0.4 mg Oral Daily Delano Metz, MD   0.4 mg at 01/01/16 1007  . thiamine (VITAMIN B-1) tablet 100 mg  100 mg Oral Daily Delano Metz, MD   100 mg at 01/01/16 1006     Discharge Medications: Please see discharge summary for a list of discharge medications.  Relevant Imaging Results:  Relevant Lab Results:   Additional Information Home Health recommended at ALF  Raye Sorrow, [place]

## 2016-01-02 NOTE — Care Management Important Message (Signed)
Important Message  Patient Details  Name: Cody [name] MRN: [id number] Date of Birth: 1932-12-17   Medicare Important Message Given:  Yes    [name], [name], RN 01/02/2016, 4:06 PM

## 2016-01-02 NOTE — Care Management Note (Signed)
Case Management Note  Patient Details  Name: Cody [name] MRN: [id number] Date of Birth: Aug 22, 1933  Subjective/Objective:                  Pt admitted with weakness. [facility]. Pt has no HH services prior to admission. PT has recommended HH PT. Facility preference is to use [facility] this is not possible with pt's insurance, facilities second choice is [facility]. Pt is agreeable. Facility aware HH has 48 hours to initiate services.   Action/Plan: Alroy BailiffLinda Lothian, of [facility], made aware of referral and will obtain pt info from chart. CSW is aware of DC plan and will make arrangements for return to facility. Pt has been discharged today, after 4pm. CSW is aware and had made request that MD have pt discharged prior to 3pm in order for facility to prepare medications.  Pt will have to return to facility in AM. No further CM needs.   Expected Discharge Date:    01/03/2016              Expected Discharge Plan:  Assisted Living / Rest Home  In-House Referral:  Clinical Social Work  Discharge planning Services  CM Consult  Post Acute Care Choice:  Home Health Choice offered to:  Patient  DME Arranged:    DME Agency:     HH Arranged:  PT HH Agency:  [facility]  Status of Service:  Completed, signed off  Medicare Important Message Given:  Yes Date Medicare IM Given:    Medicare IM give by:    Date Additional Medicare IM Given:    Additional Medicare Important Message give by:     If discussed at Long Length of Stay Meetings, dates discussed:    Additional Comments:  [name], Erminio Nygard Demske, RN 01/02/2016, 4:10 PM

## 2016-01-02 NOTE — Evaluation (Signed)
Physical Therapy Evaluation Patient Details Name: Cody [name] MRN: [id number] DOB: [birth year] Today's Date: 01/02/2016   History of Present Illness  Cody [name] is a 82 y.o. 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 Sent here from SNF for increased falls, sluggish, can't get up with PT like he used to. Over the past 1-2 weeks. Medication review shows Restoril restarted on Feb 6th as prn but has been getting every night since started except two nights. Also on scheduled SSRI, haldol and loratadine.  Clinical Impression  Pt was seen for initial evaluation.  He was alert and cooperative but had little to no ability to retain any short term memory.  He was able to follow very simple directions.  He was found to have generalized weakness with unstable gait.  He is not safe to be ambulating at [facility] unless closely guarded by staff.  Otherwise, he will need to mobilize in a w/c.  Recommend HHPT to work with him to maximize any rehab potential there.     Follow Up Recommendations Home health PT    Equipment Recommendations  None recommended by PT    Recommendations for Other Services   none    Precautions / Restrictions Precautions Precautions: Fall Restrictions Weight Bearing Restrictions: No      Mobility  Bed Mobility Overal bed mobility: Needs Assistance Bed Mobility: Supine to Sit     Supine to sit: Supervision        Transfers Overall transfer level: Needs assistance Equipment used: Rolling walker (2 wheeled) Transfers: Sit to/from Stand Sit to Stand: Min assist         General transfer comment: min to mod assist to stand, verbal cues for proper hand placement and anterior weight shift  Ambulation/Gait Ambulation/Gait assistance: Min assist Ambulation Distance (Feet): 20 Feet Assistive device: Rolling walker (2 wheeled) Gait Pattern/deviations: Narrow base of support;Decreased stride length   Gait velocity  interpretation: <1.8 ft/sec, indicative of risk for recurrent falls General Gait Details: no actual loss of balance but unstable gait due to above deviations  Stairs            Wheelchair Mobility    Modified Rankin (Stroke Patients Only)       Balance Overall balance assessment: History of Falls;Needs assistance Sitting-balance support: No upper extremity supported;Feet supported Sitting balance-Leahy Scale: Good     Standing balance support: Bilateral upper extremity supported Standing balance-Leahy Scale: Fair                               Pertinent Vitals/Pain Pain Assessment: No/denies pain    Home Living Family/patient expects to be discharged to:: Assisted living               Home Equipment: Walker - 2 wheels;Bedside commode;Shower seat Additional Comments: ambulates with a walker    Prior Function Level of Independence: Needs assistance   Gait / Transfers Assistance Needed: ambualtes with walker, unsure of the amount of assist needed prior to admission  ADL's / Homemaking Assistance Needed: Staff assisted pt.  Comments: loss of left eye, unsure as to the amount of sight in the right eye     Hand Dominance        Extremity/Trunk Assessment   Upper Extremity Assessment: Generalized weakness           Lower Extremity Assessment: Generalized weakness  Communication   Communication: HOH  Cognition Arousal/Alertness: Awake/alert Behavior During Therapy: WFL for tasks assessed/performed Overall Cognitive Status: History of cognitive impairments - at baseline                      General Comments      Exercises        Assessment/Plan    PT Assessment All further PT needs can be met in the next venue of care  PT Diagnosis     PT Problem List Decreased strength;Decreased balance;Decreased activity tolerance;Decreased mobility  PT Treatment Interventions     PT Goals (Current goals can be found in the  Care Plan section) Acute Rehab PT Goals PT Goal Formulation: All assessment and education complete, DC therapy    Frequency     Barriers to discharge        Co-evaluation               End of Session Equipment Utilized During Treatment: Gait belt Activity Tolerance: Patient tolerated treatment well Patient left: in chair;with chair alarm set;with call bell/phone within reach;with nursing/sitter in room Nurse Communication: Mobility status         Time: 8614-8307 PT Time Calculation (min) (ACUTE ONLY): 26 min   Charges:   PT Evaluation $PT Eval Low Complexity: 1 Procedure     PT G CodesDemetrios Isaacs L  PT 01/02/2016, 9:37 AM

## 2016-01-02 NOTE — Discharge Summary (Addendum)
Physician Discharge Summary  Cody [name] ZOX:[id number] DOB: 1933-03-26 DOA: 12/31/2015  PCP: Laqueta LindenKONESWARAN, SURESH A, MD  Admit date: 12/31/2015 Discharge date: 01/02/2016  Time spent: 35 minutes  Recommendations for Outpatient Follow-up:  1. Follow up with PCP in one week.   Discharge Diagnoses:  Principal Problem:   Generalized weakness Active Problems:   Dementia   DM type 2 causing vascular disease (HCC)   Essential hypertension, benign   Falls   Acute on chronic renal insufficiency (HCC)   Gait disturbance   Volume depletion   Discharge Condition: improved.  Less sleepy.  Not confused.  Cr improved form 2.3 down to 1.57.   Diet recommendation: As tolerated.    Filed Weights   12/31/15 1755 12/31/15 2010 01/02/16 0636  Weight: 68.493 kg (151 lb) 65.3 kg (143 lb 15.4 oz) 65.318 kg (144 lb)    History of present illness: patient was admitted by Dr Lamar Laundry. [place] for weakness, and unable to stand on March 19. 2017.  As per his H and P: "  Cody [name] is a 82 y.o. 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 Sent here from SNF for increased falls, sluggish, can't get up with PT like he used to. Over the past 1-2 weeks. Medication review shows Restoril restarted on Feb 6th as prn but has been getting every night since started except two nights. Also on scheduled SSRI, haldol and loratadine.   Patient responds to simple questions, poor historian. No CP, cough, fever or abd pain. "where's the food?". No HA or blurred vision  Hospital Course: Cody [name] is an 82 y.o. male with hx of moderate dementia, assisted living resident, DM, CHF, falls, complete HB s/p ppm recently, admitted for weakness and fallings, and felt to be due to volume depletion and medications. IVF was given; Haldol and restoril discontinued, and PT was consulted. He did not want to participate in PT which apparently is not unusual for him, as the PT team knows  him. His EKG showed paced rhythm. Serology showed improvement of his AKI with IVF, and improved to 1.5 from 2.3.  This is closer to his baseline Cr.  With d/c his sedative meds, he improved, and becomes more alert.  He was started on Aricept low dose, and seems to be tolerating it well.  He will be discharge on the new med regimen, with less sedating meds, and Aricept.   He should follow up with his PCP in one week.   Thank you and Good Day.   Discharge Exam: Filed Vitals:   01/02/16 0932 01/02/16 1335  BP: 151/71 147/73  Pulse: 80 78  Temp: 98.6 F (37 C) 98.4 F (36.9 C)  Resp: 20 20   Discharge Instructions   Discharge Instructions    Diet - low sodium heart healthy    Complete by:  As directed      Increase activity slowly    Complete by:  As directed           Current Discharge Medication List    START taking these medications   Details  donepezil (ARICEPT) 5 MG tablet Take 1 tablet (5 mg total) by mouth at bedtime. Qty: 30 tablet, Refills: 2      CONTINUE these medications which have NOT CHANGED   Details  aspirin EC 81 MG tablet Take 81 mg by mouth daily.      Camphor-Eucalyptus-Menthol (MEDICATED CHEST RUB EX) Apply 1  application topically 2 (two) times daily.    clotrimazole-betamethasone (LOTRISONE) cream Apply 1 application topically 2 (two) times daily as needed (Applied to scrotum area in a small amount as needed for rash). Reported on 12/13/2015    escitalopram (LEXAPRO) 10 MG tablet Take 10 mg by mouth daily.    fluticasone (FLONASE) 50 MCG/ACT nasal spray Place 1 spray into both nostrils daily.     furosemide (LASIX) 40 MG tablet Take 1 tablet (40 mg total) by mouth daily. Qty: 30 tablet, Refills: 1    haloperidol (HALDOL) 1 MG tablet Take 1 mg by mouth at bedtime.      insulin detemir (LEVEMIR) 100 UNIT/ML injection Inject 0.3 or 30 units into the skin at bedtime. Qty: 10 mL, Refills: 3    insulin lispro (HUMALOG) 100 UNIT/ML injection Inject 15  Units into the skin 3 (three) times daily before meals. Give if blood sugars are over 150    linagliptin (TRADJENTA) 5 MG TABS tablet Take 5 mg by mouth daily.    lisinopril (PRINIVIL,ZESTRIL) 2.5 MG tablet Take 1 tablet (2.5 mg total) by mouth daily. Qty: 30 tablet, Refills: 1    metoprolol succinate (TOPROL XL) 25 MG 24 hr tablet Take 1 tablet (25 mg total) by mouth daily. Qty: 30 tablet, Refills: 1    Tamsulosin HCl (FLOMAX) 0.4 MG CAPS Take 0.4 mg by mouth daily.      Thiamine HCl (VITAMIN B-1) 100 MG tablet Take 100 mg by mouth daily.        STOP taking these medications     hydrochlorothiazide (HYDRODIURIL) 25 MG tablet      hydrOXYzine (ATARAX/VISTARIL) 25 MG tablet      loratadine (CLARITIN) 10 MG tablet      Melatonin 5 MG CAPS      temazepam (RESTORIL) 15 MG capsule      potassium chloride SA (K-DUR,KLOR-CON) 20 MEQ tablet        Allergies  Allergen Reactions  . Sulfa Antibiotics       The results of significant diagnostics from this hospitalization (including imaging, microbiology, ancillary and laboratory) are listed below for reference.    Significant Diagnostic Studies: Dg Chest 2 View  12/31/2015  CLINICAL DATA:  Cough.  Falls. EXAM: CHEST  2 VIEW COMPARISON:  12/07/2015 chest radiograph. FINDINGS: Stable configuration of 2 lead left subclavian pacemaker. Stable cardiomediastinal silhouette with top-normal heart size. No pneumothorax. No pleural effusion. Stable elevation of the right hemidiaphragm with right basilar atelectasis. No pulmonary edema. No acute consolidative airspace disease. IMPRESSION: No active cardiopulmonary disease. Stable elevation of the right hemidiaphragm with right basilar atelectasis. Electronically Signed   By: Delbert Phenix M.D.   On: 12/31/2015 14:01   Ct Head Wo Contrast  12/31/2015  CLINICAL DATA:  Increasing falls.  Parkinson' s disease. EXAM: CT HEAD WITHOUT CONTRAST TECHNIQUE: Contiguous axial images were obtained from the  base of the skull through the vertex without intravenous contrast. COMPARISON:  07/03/2015 FINDINGS: The brainstem, cerebellum, cerebral peduncles, thalami, basal ganglia, basilar cisterns, and ventricular system appear within normal limits. Periventricular white matter and corona radiata hypodensities favor chronic ischemic microvascular white matter disease. No intracranial hemorrhage, mass lesion, or acute CVA. Left globe prosthesis. Mild chronic ethmoid sinusitis. IMPRESSION: 1. No acute intracranial findings. 2. Periventricular white matter and corona radiata hypodensities favor chronic ischemic microvascular white matter disease. 3. Mild chronic ethmoid sinusitis. Electronically Signed   By: Gaylyn Rong M.D.   On: 12/31/2015 14:45   Dg Chest  Portable 1 View  12/07/2015  CLINICAL DATA:  Acute onset of worsening shortness of breath. Initial encounter. EXAM: PORTABLE CHEST 1 VIEW COMPARISON:  Chest radiograph performed 12/02/2015 FINDINGS: A somewhat loculated small right pleural effusion is again noted, with associated opacity likely reflecting atelectasis. Underlying vascular congestion is seen, with increased interstitial markings, raising concern for mild interstitial edema. No pneumothorax is identified. The cardiomediastinal silhouette is borderline enlarged. A pacemaker is noted overlying the left chest wall, with leads ending overlying the right atrium and right ventricle. No acute osseous abnormalities are seen. IMPRESSION: 1. Vascular congestion and borderline cardiomegaly, with increased interstitial markings, raising concern for mild interstitial edema. 2. Somewhat loculated small right pleural effusion again noted, with associated opacity likely reflecting atelectasis. Electronically Signed   By: Roanna Raider M.D.   On: 12/07/2015 21:52    Microbiology: Recent Results (from the past 240 hour(s))  Urine culture     Status: None (Preliminary result)   Collection Time: 12/31/15  3:10  PM  Result Value Ref Range Status   Specimen Description URINE, CATHETERIZED  Final   Special Requests NONE  Final   Culture   Final    >=100,000 COLONIES/mL STAPHYLOCOCCUS SPECIES (COAGULASE NEGATIVE) SUSCEPTIBILITIES TO FOLLOW Performed at [facility]    Report Status PENDING  Incomplete     Labs: Basic Metabolic Panel:  Recent Labs Lab 12/31/15 1245 01/01/16 0642  NA 142 143  K 4.5 3.6  CL 98* 103  CO2 31 30  GLUCOSE 316* 219*  BUN 63* 55*  CREATININE 2.33* 1.57*  CALCIUM 8.7* 8.2*   Liver Function Tests:  Recent Labs Lab 12/31/15 1245  AST 34  ALT 40  ALKPHOS 69  BILITOT 1.0  PROT 7.6  ALBUMIN 3.4*   CBC:  Recent Labs Lab 12/31/15 1245 01/01/16 0642  WBC 9.3 10.3  NEUTROABS 5.9  --   HGB 13.7 12.8*  HCT 43.6 40.1  MCV 89.2 88.1  PLT 165 179   Cardiac Enzymes:  Recent Labs Lab 12/31/15 1245  TROPONINI 0.03   BNP: BNP (last 3 results)  Recent Labs  12/07/15 2144 12/31/15 1245  BNP 1063.0* 73.0     CBG:  Recent Labs Lab 01/01/16 1211 01/01/16 1712 01/01/16 2155 01/02/16 0755 01/02/16 1058  GLUCAP 217* 174* 208* 99 183*    Signed:  Nesbit Michon MD. Jerrel Ivory.  [facility] 01/02/2016, 4:06 PM

## 2016-01-02 NOTE — Progress Notes (Addendum)
Patient is scheduled for DC since yesterday. ALF contacted and agreeable to pick patient up around 1:00pm. All clinicals have been faxed.  No barriers to DC.  [facility]   Discharge was completed too late in the day for patient to return to facility. Facility reports medications cannot be obtained.  Patient will be able to DC in AM. All clinicals faxed to facility for review purposes for early DC in AM.  [facility] to [facility].  Deretha EmoryHannah Loranzo Desha LCSW, MSW Clinical Social Work: Emergency Room [phone]

## 2016-01-02 NOTE — Progress Notes (Signed)
Patient is scheduled for DC back to ALF today. Patient's information must be completed before 3pm for patient to receive medications for ALF to accept back per facility. Facility will provide transportation for patient once he has a DC order.  MD has been paged and made aware of time limits for DC.  Will facilitate DC once clinicals are complete and arrange for [facility] to pick patient up.  Deretha EmoryHannah Nunzio Banet LCSW, MSW Clinical Social Work: Emergency Room [phone]

## 2016-01-03 LAB — GLUCOSE, CAPILLARY
GLUCOSE-CAPILLARY: 33 mg/dL — AB (ref 65–99)
Glucose-Capillary: 140 mg/dL — ABNORMAL HIGH (ref 65–99)
Glucose-Capillary: 51 mg/dL — ABNORMAL LOW (ref 65–99)
Glucose-Capillary: 53 mg/dL — ABNORMAL LOW (ref 65–99)
Glucose-Capillary: 164 mg/dL — ABNORMAL HIGH (ref 65–99)

## 2016-01-03 LAB — URINE CULTURE: Culture: >=100000

## 2016-01-03 MED ORDER — DEXTROSE 50 % IV SOLN
INTRAVENOUS | Status: AC
  Administered 2016-01-03: 25 mL
  Filled 2016-01-03: qty 50

## 2016-01-03 MED ORDER — INSULIN DETEMIR 100 UNIT/ML ~~LOC~~ SOLN
30.0000 [IU] | Freq: Every day | SUBCUTANEOUS | Status: DC
  Filled 2016-01-03: qty 0.3

## 2016-01-03 MED ORDER — INSULIN DETEMIR 100 UNIT/ML ~~LOC~~ SOLN: 30.0000 [IU] | Freq: Every day | SUBCUTANEOUS | Status: DC

## 2016-01-03 NOTE — Progress Notes (Signed)
[name] Discharge packet sent with patient. [facility], nurse. Patient stable. [name].

## 2016-01-03 NOTE — Progress Notes (Signed)
Hypoglycemic Event  CBG: 33  Treatment: 15 GM carbohydrate snack and D50 IV 25 mL  Symptoms: None  Follow-up CBG: Time 0835, 0848, 0921 CBG Result: 51, 53, 140  Possible Reasons for Event: Unknown  Comments/MD notified: Dr. Conley RollsLe notified    Cody [name]

## 2016-01-03 NOTE — Progress Notes (Addendum)
ATTENDING:  Patient was to be discharged yesterday, but didn't go.  He remained stable and has no new event. His CBG was 30's, but rose to 173 after eating breakfast, therefore, to be safe, will reduce his Lantus from 40 units to 30 units Qhs. He will go today as planned.   Houston SirenPeter Emmajo Bennette, MD  FACP.  Hospital Medicine.

## 2016-01-18 ENCOUNTER — Encounter (HOSPITAL_COMMUNITY): Payer: Self-pay | Admitting: Cardiology

## 2016-01-18 ENCOUNTER — Emergency Department (HOSPITAL_COMMUNITY): Payer: Medicare Other

## 2016-01-18 ENCOUNTER — Inpatient Hospital Stay (HOSPITAL_COMMUNITY)
Admission: EM | Admit: 2016-01-18 | Discharge: 2016-01-24 | DRG: 683 | Disposition: A | Discharge status: Skilled Nursing Facility | Payer: Medicare Other | Attending: Family Medicine | Admitting: Family Medicine

## 2016-01-18 DIAGNOSIS — I700 Atherosclerosis of aorta: Secondary | ICD-10-CM | POA: Diagnosis present

## 2016-01-18 DIAGNOSIS — A419 Sepsis, unspecified organism: Secondary | ICD-10-CM | POA: Diagnosis present

## 2016-01-18 DIAGNOSIS — E870 Hyperosmolality and hypernatremia: Secondary | ICD-10-CM | POA: Diagnosis present

## 2016-01-18 DIAGNOSIS — I110 Hypertensive heart disease with heart failure: Secondary | ICD-10-CM | POA: Diagnosis present

## 2016-01-18 DIAGNOSIS — E1159 Type 2 diabetes mellitus with other circulatory complications: Secondary | ICD-10-CM | POA: Diagnosis present

## 2016-01-18 DIAGNOSIS — Z7982 Long term (current) use of aspirin: Secondary | ICD-10-CM | POA: Diagnosis not present

## 2016-01-18 DIAGNOSIS — G20 Parkinson's disease: Secondary | ICD-10-CM | POA: Diagnosis present

## 2016-01-18 DIAGNOSIS — E872 Acidosis, unspecified: Secondary | ICD-10-CM | POA: Diagnosis present

## 2016-01-18 DIAGNOSIS — R197 Diarrhea, unspecified: Secondary | ICD-10-CM

## 2016-01-18 DIAGNOSIS — N400 Enlarged prostate without lower urinary tract symptoms: Secondary | ICD-10-CM | POA: Diagnosis present

## 2016-01-18 DIAGNOSIS — I248 Other forms of acute ischemic heart disease: Secondary | ICD-10-CM | POA: Diagnosis present

## 2016-01-18 DIAGNOSIS — F0280 Dementia in other diseases classified elsewhere without behavioral disturbance: Secondary | ICD-10-CM | POA: Diagnosis present

## 2016-01-18 DIAGNOSIS — E860 Dehydration: Secondary | ICD-10-CM | POA: Diagnosis present

## 2016-01-18 DIAGNOSIS — R7989 Other specified abnormal findings of blood chemistry: Secondary | ICD-10-CM | POA: Diagnosis not present

## 2016-01-18 DIAGNOSIS — Z794 Long term (current) use of insulin: Secondary | ICD-10-CM

## 2016-01-18 DIAGNOSIS — I959 Hypotension, unspecified: Secondary | ICD-10-CM | POA: Diagnosis not present

## 2016-01-18 DIAGNOSIS — R112 Nausea with vomiting, unspecified: Secondary | ICD-10-CM

## 2016-01-18 DIAGNOSIS — I10 Essential (primary) hypertension: Secondary | ICD-10-CM | POA: Diagnosis present

## 2016-01-18 DIAGNOSIS — R0902 Hypoxemia: Secondary | ICD-10-CM | POA: Diagnosis present

## 2016-01-18 DIAGNOSIS — Z87891 Personal history of nicotine dependence: Secondary | ICD-10-CM | POA: Diagnosis not present

## 2016-01-18 DIAGNOSIS — R6520 Severe sepsis without septic shock: Secondary | ICD-10-CM | POA: Diagnosis present

## 2016-01-18 DIAGNOSIS — Z7951 Long term (current) use of inhaled steroids: Secondary | ICD-10-CM

## 2016-01-18 DIAGNOSIS — E11649 Type 2 diabetes mellitus with hypoglycemia without coma: Secondary | ICD-10-CM | POA: Diagnosis present

## 2016-01-18 DIAGNOSIS — N170 Acute kidney failure with tubular necrosis: Principal | ICD-10-CM | POA: Diagnosis present

## 2016-01-18 DIAGNOSIS — I5042 Chronic combined systolic (congestive) and diastolic (congestive) heart failure: Secondary | ICD-10-CM | POA: Diagnosis present

## 2016-01-18 DIAGNOSIS — H548 Legal blindness, as defined in USA: Secondary | ICD-10-CM | POA: Diagnosis present

## 2016-01-18 DIAGNOSIS — K529 Noninfective gastroenteritis and colitis, unspecified: Secondary | ICD-10-CM | POA: Diagnosis not present

## 2016-01-18 DIAGNOSIS — N390 Urinary tract infection, site not specified: Secondary | ICD-10-CM | POA: Diagnosis present

## 2016-01-18 DIAGNOSIS — D696 Thrombocytopenia, unspecified: Secondary | ICD-10-CM | POA: Diagnosis present

## 2016-01-18 DIAGNOSIS — N179 Acute kidney failure, unspecified: Secondary | ICD-10-CM | POA: Diagnosis present

## 2016-01-18 DIAGNOSIS — R778 Other specified abnormalities of plasma proteins: Secondary | ICD-10-CM | POA: Diagnosis present

## 2016-01-18 LAB — COMPREHENSIVE METABOLIC PANEL
ALBUMIN: 3.6 g/dL (ref 3.5–5.0)
ALT: 40 U/L (ref 17–63)
ANION GAP: 24 — AB (ref 5–15)
AST: 44 U/L — ABNORMAL HIGH (ref 15–41)
Alkaline Phosphatase: 90 U/L (ref 38–126)
BILIRUBIN TOTAL: 0.5 mg/dL (ref 0.3–1.2)
BUN: 168 mg/dL — ABNORMAL HIGH (ref 6–20)
CALCIUM: 9.5 mg/dL (ref 8.9–10.3)
CHLORIDE: 107 mmol/L (ref 101–111)
CO2: 21 mmol/L — ABNORMAL LOW (ref 22–32)
CREATININE: 6.95 mg/dL — AB (ref 0.61–1.24)
GFR calc Af Amer: 8 mL/min — ABNORMAL LOW (ref >60)
GFR, EST NON AFRICAN AMERICAN: 7 mL/min — AB (ref >60)
GLUCOSE: 103 mg/dL — AB (ref 65–99)
POTASSIUM: 4.2 mmol/L (ref 3.5–5.1)
Sodium: 152 mmol/L — ABNORMAL HIGH (ref 135–145)
TOTAL PROTEIN: 7.5 g/dL (ref 6.5–8.1)

## 2016-01-18 LAB — CBC
HCT: 44.9 % (ref 39.0–52.0)
Hemoglobin: 14.6 g/dL (ref 13.0–17.0)
MCH: 28.4 pg (ref 26.0–34.0)
MCHC: 32.5 g/dL (ref 30.0–36.0)
MCV: 87.4 fL (ref 78.0–100.0)
PLATELETS: 164 10*3/uL (ref 150–400)
RBC: 5.14 MIL/uL (ref 4.22–5.81)
RDW: 13.9 % (ref 11.5–15.5)
WBC: 15.9 10*3/uL — AB (ref 4.0–10.5)

## 2016-01-18 LAB — BASIC METABOLIC PANEL
Anion gap: 14 (ref 5–15)
BUN: 144 mg/dL — AB (ref 6–20)
CHLORIDE: 118 mmol/L — AB (ref 101–111)
CO2: 17 mmol/L — ABNORMAL LOW (ref 22–32)
CREATININE: 5.13 mg/dL — AB (ref 0.61–1.24)
Calcium: 7.3 mg/dL — ABNORMAL LOW (ref 8.9–10.3)
GFR, EST AFRICAN AMERICAN: 11 mL/min — AB (ref >60)
GFR, EST NON AFRICAN AMERICAN: 10 mL/min — AB (ref >60)
Glucose, Bld: 157 mg/dL — ABNORMAL HIGH (ref 65–99)
Potassium: 4.1 mmol/L (ref 3.5–5.1)
SODIUM: 149 mmol/L — AB (ref 135–145)

## 2016-01-18 LAB — C DIFFICILE QUICK SCREEN W PCR REFLEX
C DIFFICILE (CDIFF) INTERP: NEGATIVE
C DIFFICILE (CDIFF) TOXIN: NEGATIVE
C DIFFICLE (CDIFF) ANTIGEN: NEGATIVE

## 2016-01-18 LAB — GLUCOSE, CAPILLARY
Glucose-Capillary: 81 mg/dL (ref 65–99)
Glucose-Capillary: 128 mg/dL — ABNORMAL HIGH (ref 65–99)

## 2016-01-18 LAB — URINALYSIS, ROUTINE W REFLEX MICROSCOPIC
Bilirubin Urine: NEGATIVE
GLUCOSE, UA: NEGATIVE mg/dL
KETONES UR: NEGATIVE mg/dL
Nitrite: NEGATIVE
PH: 6 (ref 5.0–8.0)
Protein, ur: 100 mg/dL — AB
Specific Gravity, Urine: 1.015 (ref 1.005–1.030)

## 2016-01-18 LAB — TROPONIN I: Troponin I: 0.09 ng/mL — ABNORMAL HIGH (ref <0.031)

## 2016-01-18 LAB — MRSA PCR SCREENING: MRSA by PCR: POSITIVE — AB

## 2016-01-18 LAB — URINE MICROSCOPIC-ADD ON: SQUAMOUS EPITHELIAL / LPF: NONE SEEN

## 2016-01-18 LAB — I-STAT CG4 LACTIC ACID, ED: Lactic Acid, Venous: 7.89 mmol/L (ref 0.5–2.0)

## 2016-01-18 LAB — PROTIME-INR
INR: 1.34 (ref 0.00–1.49)
Prothrombin Time: 16.7 seconds — ABNORMAL HIGH (ref 11.6–15.2)

## 2016-01-18 LAB — PROCALCITONIN: Procalcitonin: 0.44 ng/mL

## 2016-01-18 LAB — APTT: APTT: 28 s (ref 24–37)

## 2016-01-18 LAB — LIPASE, BLOOD: LIPASE: 56 U/L — AB (ref 11–51)

## 2016-01-18 LAB — LACTIC ACID, PLASMA: LACTIC ACID, VENOUS: 2 mmol/L (ref 0.5–2.0)

## 2016-01-18 MED ORDER — HALOPERIDOL 2 MG PO TABS
1.0000 mg | ORAL_TABLET | Freq: Every day | ORAL | Status: DC
  Administered 2016-01-18 – 2016-01-23 (×6): 1 mg via ORAL
  Filled 2016-01-18 (×6): qty 1

## 2016-01-18 MED ORDER — PIPERACILLIN-TAZOBACTAM IN DEX 2-0.25 GM/50ML IV SOLN
2.2500 g | Freq: Three times a day (TID) | INTRAVENOUS | Status: DC
  Filled 2016-01-18 (×3): qty 50

## 2016-01-18 MED ORDER — INSULIN DETEMIR 100 UNIT/ML ~~LOC~~ SOLN
30.0000 [IU] | Freq: Every day | SUBCUTANEOUS | Status: DC
  Administered 2016-01-18 – 2016-01-19 (×2): 30 [IU] via SUBCUTANEOUS
  Filled 2016-01-18 (×4): qty 0.3

## 2016-01-18 MED ORDER — VITAMIN B-1 100 MG PO TABS
100.0000 mg | ORAL_TABLET | Freq: Every day | ORAL | Status: DC
  Administered 2016-01-19 – 2016-01-24 (×6): 100 mg via ORAL
  Filled 2016-01-18 (×7): qty 1

## 2016-01-18 MED ORDER — HEPARIN SODIUM (PORCINE) 5000 UNIT/ML IJ SOLN
5000.0000 [IU] | Freq: Three times a day (TID) | INTRAMUSCULAR | Status: DC
  Administered 2016-01-18 – 2016-01-24 (×19): 5000 [IU] via SUBCUTANEOUS
  Filled 2016-01-18 (×19): qty 1

## 2016-01-18 MED ORDER — CEFEPIME HCL 2 G IJ SOLR
INTRAMUSCULAR | Status: AC
  Filled 2016-01-18: qty 2

## 2016-01-18 MED ORDER — ASPIRIN EC 81 MG PO TBEC
81.0000 mg | DELAYED_RELEASE_TABLET | Freq: Every day | ORAL | Status: DC
  Administered 2016-01-19 – 2016-01-24 (×6): 81 mg via ORAL
  Filled 2016-01-18 (×7): qty 1

## 2016-01-18 MED ORDER — SODIUM CHLORIDE 0.9 % IV SOLN: INTRAVENOUS | Status: DC

## 2016-01-18 MED ORDER — DONEPEZIL HCL 5 MG PO TABS
5.0000 mg | ORAL_TABLET | Freq: Every day | ORAL | Status: DC
Start: 2016-01-19 — End: 2016-01-24
  Administered 2016-01-19 – 2016-01-23 (×5): 5 mg via ORAL
  Filled 2016-01-18 (×5): qty 1

## 2016-01-18 MED ORDER — SODIUM CHLORIDE 0.9 % IV BOLUS (SEPSIS): 1000.0000 mL | Freq: Once | INTRAVENOUS | Status: DC

## 2016-01-18 MED ORDER — SODIUM CHLORIDE 0.9 % IV BOLUS (SEPSIS)
500.0000 mL | Freq: Once | INTRAVENOUS | Status: AC
  Administered 2016-01-18: 500 mL via INTRAVENOUS

## 2016-01-18 MED ORDER — ACETAMINOPHEN 650 MG RE SUPP
650.0000 mg | Freq: Four times a day (QID) | RECTAL | Status: DC | PRN
Start: 2016-01-18 — End: 2016-01-24

## 2016-01-18 MED ORDER — VANCOMYCIN HCL IN DEXTROSE 1-5 GM/200ML-% IV SOLN
1000.0000 mg | Freq: Once | INTRAVENOUS | Status: AC
Start: 2016-01-18 — End: 2016-01-18
  Administered 2016-01-18: 1000 mg via INTRAVENOUS
  Filled 2016-01-18: qty 200

## 2016-01-18 MED ORDER — SODIUM CHLORIDE 0.9 % IV BOLUS (SEPSIS)
1000.0000 mL | Freq: Once | INTRAVENOUS | Status: AC
  Administered 2016-01-18: 1000 mL via INTRAVENOUS

## 2016-01-18 MED ORDER — INSULIN ASPART 100 UNIT/ML ~~LOC~~ SOLN
4.0000 [IU] | Freq: Three times a day (TID) | SUBCUTANEOUS | Status: DC
  Administered 2016-01-19 – 2016-01-21 (×6): 4 [IU] via SUBCUTANEOUS

## 2016-01-18 MED ORDER — DEXTROSE 5 % IV SOLN
500.0000 mg | INTRAVENOUS | Status: DC
  Administered 2016-01-19: 500 mg via INTRAVENOUS
  Filled 2016-01-18 (×3): qty 0.5

## 2016-01-18 MED ORDER — ASPIRIN 81 MG PO CHEW
243.0000 mg | CHEWABLE_TABLET | Freq: Once | ORAL | Status: DC
Start: 2016-01-18 — End: 2016-01-24
  Filled 2016-01-18: qty 3

## 2016-01-18 MED ORDER — INSULIN ASPART 100 UNIT/ML ~~LOC~~ SOLN
0.0000 [IU] | Freq: Three times a day (TID) | SUBCUTANEOUS | Status: DC
  Administered 2016-01-19: 2 [IU] via SUBCUTANEOUS
  Administered 2016-01-19: 4 [IU] via SUBCUTANEOUS
  Administered 2016-01-19 – 2016-01-20 (×2): 2 [IU] via SUBCUTANEOUS
  Administered 2016-01-21: 3 [IU] via SUBCUTANEOUS
  Administered 2016-01-21: 5 [IU] via SUBCUTANEOUS
  Administered 2016-01-22: 2 [IU] via SUBCUTANEOUS
  Administered 2016-01-22: 5 [IU] via SUBCUTANEOUS
  Administered 2016-01-22: 2 [IU] via SUBCUTANEOUS
  Administered 2016-01-23: 11 [IU] via SUBCUTANEOUS
  Administered 2016-01-23 (×2): 5 [IU] via SUBCUTANEOUS
  Administered 2016-01-24: 11 [IU] via SUBCUTANEOUS
  Administered 2016-01-24: 5 [IU] via SUBCUTANEOUS

## 2016-01-18 MED ORDER — SODIUM CHLORIDE 0.9 % IV SOLN
INTRAVENOUS | Status: AC
  Administered 2016-01-18: 18:00:00 via INTRAVENOUS

## 2016-01-18 MED ORDER — SODIUM CHLORIDE 0.9 % IV BOLUS (SEPSIS)
500.0000 mL | INTRAVENOUS | Status: AC
  Administered 2016-01-18: 500 mL via INTRAVENOUS

## 2016-01-18 MED ORDER — TAMSULOSIN HCL 0.4 MG PO CAPS
0.4000 mg | ORAL_CAPSULE | Freq: Every day | ORAL | Status: DC
  Administered 2016-01-19 – 2016-01-24 (×6): 0.4 mg via ORAL
  Filled 2016-01-18 (×7): qty 1

## 2016-01-18 MED ORDER — HYDROCODONE-ACETAMINOPHEN 5-325 MG PO TABS: 1.0000 | ORAL_TABLET | ORAL | Status: DC | PRN

## 2016-01-18 MED ORDER — VANCOMYCIN HCL IN DEXTROSE 1-5 GM/200ML-% IV SOLN: 1000.0000 mg | INTRAVENOUS | Status: DC

## 2016-01-18 MED ORDER — PIPERACILLIN-TAZOBACTAM 3.375 G IVPB 30 MIN
3.3750 g | Freq: Once | INTRAVENOUS | Status: AC
  Administered 2016-01-18: 3.375 g via INTRAVENOUS
  Filled 2016-01-18: qty 50

## 2016-01-18 MED ORDER — DEXTROSE 5 % IV SOLN
2.0000 g | Freq: Once | INTRAVENOUS | Status: AC
  Administered 2016-01-18: 2 g via INTRAVENOUS

## 2016-01-18 MED ORDER — SODIUM CHLORIDE 0.9% FLUSH
3.0000 mL | Freq: Two times a day (BID) | INTRAVENOUS | Status: DC
  Administered 2016-01-18 – 2016-01-24 (×11): 3 mL via INTRAVENOUS

## 2016-01-18 MED ORDER — ACETAMINOPHEN 325 MG PO TABS
650.0000 mg | ORAL_TABLET | Freq: Four times a day (QID) | ORAL | Status: DC | PRN
  Administered 2016-01-20: 650 mg via ORAL
  Filled 2016-01-18: qty 2

## 2016-01-18 MED ORDER — ONDANSETRON HCL 4 MG PO TABS: 4.0000 mg | ORAL_TABLET | Freq: Four times a day (QID) | ORAL | Status: DC | PRN

## 2016-01-18 MED ORDER — FLUTICASONE PROPIONATE 50 MCG/ACT NA SUSP
1.0000 | Freq: Every day | NASAL | Status: DC
  Administered 2016-01-19 – 2016-01-24 (×6): 1 via NASAL
  Filled 2016-01-18: qty 16

## 2016-01-18 MED ORDER — SODIUM CHLORIDE 0.9 % IV BOLUS (SEPSIS)
1000.0000 mL | INTRAVENOUS | Status: AC
  Administered 2016-01-18: 1000 mL via INTRAVENOUS

## 2016-01-18 MED ORDER — DIATRIZOATE MEGLUMINE & SODIUM 66-10 % PO SOLN
ORAL | Status: AC
  Administered 2016-01-18: 30 mL
  Filled 2016-01-18: qty 30

## 2016-01-18 MED ORDER — HALOPERIDOL 2 MG PO TABS
ORAL_TABLET | ORAL | Status: AC
  Filled 2016-01-18: qty 1

## 2016-01-18 MED ORDER — ESCITALOPRAM OXALATE 10 MG PO TABS
10.0000 mg | ORAL_TABLET | Freq: Every day | ORAL | Status: DC
  Administered 2016-01-19 – 2016-01-24 (×6): 10 mg via ORAL
  Filled 2016-01-18 (×7): qty 1

## 2016-01-18 MED ORDER — ONDANSETRON HCL 4 MG/2ML IJ SOLN: 4.0000 mg | Freq: Four times a day (QID) | INTRAMUSCULAR | Status: DC | PRN

## 2016-01-18 NOTE — ED Notes (Signed)
[facility] with nausea, vomiting and diarrhea.  Weakness.

## 2016-01-18 NOTE — H&P (Signed)
[facility] History and Physical  Cody [name] JXB:[id number] DOB: [birth year] DOA: 01/18/2016  Referring physician: ED physician PCP: Laqueta Linden, MD  Specialists: Dr. Wyline Mood (cardiology), Dr. Fransico Him (endocrinology)   Chief Complaint:  Weakness, abdominal pain   HPI: Cody [name] is a medically-complex 82 y.o. male with PMH of insulin-dependent diabetes mellitus, hypertension, advanced Parkinson dementia, and chronic combined systolic/diastolic CHF who presents from his SNF with reports of nausea, vomiting, diarrhea, generalized weakness, and abdominal pain. Patient had reportedly been in his usual state until SNF staff noted him to be generally weak and with nonbloody vomiting and nonbloody diarrhea. There've been no fevers reported. Patient made complaints of abdominal pain and has exhibited decreased oral intake. His symptoms continued to progress and he was transported to the hospital for further evaluation.  In ED, patient was found to be afebrile, saturating only 71% on room air, tachypneic, and with blood pressure 75/41. He was placed on 2 L/m supplemental oxygen with rise in saturations to the 90s. Chest x-ray was negative for acute cardiopulmonary disease and EKG demonstrates a ventricularly paced rhythm. CMP features hypernatremia to 152 and acute renal failure with BUN of 168 and creatinine of 6.95, up from an apparent baseline of 1.1. Bicarbonate is 21, anion gap 24, and lactic acid 7.89. CBC features a leukocytosis to 15,900. Troponin returned elevated at 0.09. Urine was sent for analysis and features many bacteria, moderate leukocytes, negative nitrites, and too numerous to count WBC. Blood and urine cultures were obtained, 30 cc/kg bolus of normal saline given, and empiric vancomycin and Zosyn administered. Patient's blood pressure improved initially with the fluid bolus, but then again trended down to 80s systolic and an additional bolus was given. Patient is  critically ill with severe sepsis, possibly pending shock, and will be admitted for ongoing evaluation and management of such. There is no family contact listed in the EMR or SNF paperwork and the patient denies having any family contacts.  Where does patient live? SNF      Can patient participate in ADLs?  Little         Review of Systems:  Unable to obtain ROS secondary to patient's clinical condition with advanced dementia.     Allergy:  Allergies  Allergen Reactions  . Sulfa Antibiotics     Past Medical History  Diagnosis Date  . Essential hypertension, benign   . Type 2 diabetes mellitus (HCC)   . Dementia   . Parkinson's disease   . Insomnia   . Benign prostate hyperplasia   . CHF (congestive heart failure) (HCC)   . Anemia   . Symptomatic bradycardia   . Syncope   . Complete AV block (HCC)   . Legally blind   . At high risk for falls 11/2014    Hx falls  . Pacemaker 12/01/2015    St. Jude    Past Surgical History  Procedure Laterality Date  . Cardiac catheterization N/A 11/30/2015    Procedure: Temporary Pacemaker;  Surgeon: Orpah Cobb, MD;  Location: [facility] INVASIVE CV LAB;  Service: Cardiovascular;  Laterality: N/A;  . Ep implantable device N/A 12/01/2015    Procedure: Pacemaker Implant;  Surgeon: Duke Salvia, MD;  Location: [facility] INVASIVE CV LAB;  Service: Cardiovascular;  Laterality: N/A;    Social History:  reports that he has quit smoking. His smoking use included Cigarettes. He has a 2 pack-year smoking history. He has never used smokeless tobacco. He reports that he does not drink alcohol  or use illicit drugs.  Family History:  Family History  Problem Relation Age of Onset  . Family history unknown: Yes     Prior to Admission medications   Medication Sig Start Date End Date Taking? Authorizing Provider  aspirin EC 81 MG tablet Take 81 mg by mouth daily.     Yes Historical Provider, MD  Camphor-Eucalyptus-Menthol (MEDICATED CHEST RUB EX) Apply 1  application topically 2 (two) times daily.   Yes Historical Provider, MD  clotrimazole-betamethasone (LOTRISONE) cream Apply 1 application topically 2 (two) times daily as needed (Applied to scrotum area in a small amount as needed for rash). Reported on 12/13/2015   Yes Historical Provider, MD  donepezil (ARICEPT) 5 MG tablet Take 1 tablet (5 mg total) by mouth at bedtime. 01/02/16  Yes Houston Siren, MD  escitalopram (LEXAPRO) 10 MG tablet Take 10 mg by mouth daily.   Yes Historical Provider, MD  fluticasone (FLONASE) 50 MCG/ACT nasal spray Place 1 spray into both nostrils daily.    Yes Historical Provider, MD  furosemide (LASIX) 40 MG tablet Take 1 tablet (40 mg total) by mouth daily. 12/09/15  Yes Erick Blinks, MD  haloperidol (HALDOL) 1 MG tablet Take 1 mg by mouth at bedtime.     Yes Historical Provider, MD  insulin detemir (LEVEMIR) 100 UNIT/ML injection Inject 0.4 mLs (40 Units total) into the skin at bedtime. 09/28/15  Yes Roma Kayser, MD  insulin lispro (HUMALOG) 100 UNIT/ML injection Inject 15 Units into the skin 3 (three) times daily before meals. Give if blood sugars are over 150   Yes Historical Provider, MD  linagliptin (TRADJENTA) 5 MG TABS tablet Take 5 mg by mouth daily.   Yes Historical Provider, MD  lisinopril (PRINIVIL,ZESTRIL) 2.5 MG tablet Take 1 tablet (2.5 mg total) by mouth daily. 12/09/15  Yes Erick Blinks, MD  metoprolol succinate (TOPROL XL) 25 MG 24 hr tablet Take 1 tablet (25 mg total) by mouth daily. 12/09/15  Yes Erick Blinks, MD  Tamsulosin HCl (FLOMAX) 0.4 MG CAPS Take 0.4 mg by mouth daily.     Yes Historical Provider, MD  Thiamine HCl (VITAMIN B-1) 100 MG tablet Take 100 mg by mouth daily.     Yes Historical Provider, MD  insulin detemir (LEVEMIR) 100 UNIT/ML injection Inject 0.3 mLs (30 Units total) into the skin at bedtime. Patient not taking: Reported on 01/18/2016 01/03/16   Houston Siren, MD    Physical Exam: Filed Vitals:   01/18/16 1530 01/18/16 1545  01/18/16 1600 01/18/16 1615  BP: 73/46 78/52 66/55  105/41  Pulse: 94 72    Temp:      TempSrc:      Resp: 23 16 23 28   Height:      Weight:      SpO2: 73% 100%     General: Not in acute respiratory distress HEENT:       Eyes: PRRL, EOMI, no scleral icterus or conjunctival pallor. Left eye post-surgical, sutured closed        ENT: No discharge from the ears or nose, no pharyngeal ulcers.        Neck: No JVD, no bruit, no appreciable mass Heme: No cervical adenopathy, no pallor Cardiac: S1/S2, RRR, soft systolic murmur at the apex. No gallops or rubs. Pulm: Good air movement bilaterally. Bilateral ronchi. Abd: Soft, nondistended, nontender, no rebound pain or gaurding, BS present. Ext: No LE edema bilaterally. 2+DP/PT pulse bilaterally. Musculoskeletal: No gross deformity, no red, hot, swollen joints  Skin: No  rashes or wounds on exposed surfaces  Neuro: Alert, not verbally responsive, cranial nerves II-XII grossly intact. No focal findings Psych: Patient is not overtly psychotic, difficult to assess given the patient's clinical condition.  Labs on Admission:  Basic Metabolic Panel:  Recent Labs Lab 01/18/16 1140  NA 152*  K 4.2  CL 107  CO2 21*  GLUCOSE 103*  BUN 168*  CREATININE 6.95*  CALCIUM 9.5   Liver Function Tests:  Recent Labs Lab 01/18/16 1140  AST 44*  ALT 40  ALKPHOS 90  BILITOT 0.5  PROT 7.5  ALBUMIN 3.6    Recent Labs Lab 01/18/16 1140  LIPASE 56*   No results for input(s): AMMONIA in the last 168 hours. CBC:  Recent Labs Lab 01/18/16 1140  WBC 15.9*  HGB 14.6  HCT 44.9  MCV 87.4  PLT 164   Cardiac Enzymes:  Recent Labs Lab 01/18/16 1140  TROPONINI 0.09*    BNP (last 3 results)  Recent Labs  12/07/15 2144 12/31/15 1245  BNP 1063.0* 73.0    ProBNP (last 3 results) No results for input(s): PROBNP in the last 8760 hours.  CBG: No results for input(s): GLUCAP in the last 168 hours.  Radiological Exams on  Admission: Ct Abdomen Pelvis Wo Contrast  01/18/2016  CLINICAL DATA:  Nausea, vomiting and diarrhea with generalized abdominal pain EXAM: CT ABDOMEN AND PELVIS WITHOUT CONTRAST TECHNIQUE: Multidetector CT imaging of the abdomen and pelvis was performed following the standard protocol without IV contrast. COMPARISON:  None. FINDINGS: Lower chest: No pleural effusion identified. Dependent changes are in the lung bases. Hepatobiliary: No suspicious liver abnormalities identified. The gallbladder appears normal. There is no biliary dilatation. Pancreas: Normal appearance of the pancreas Spleen: Normal in size. Adrenals/Urinary Tract: Fat attenuation lesion within the left adrenal gland measures 3.7 cm, image 28 of series 2. This is new from previous exam. Normal appearance of the right adrenal gland. The kidneys are unremarkable. Mild diffuse circumferential wall thickening involving the urinary bladder. Stomach/Bowel: The stomach appears normal. The small bowel loops have a normal course and caliber. No bowel obstruction. The appendix is visualized and appears normal. The scratch set there is wall thickening involving the sigmoid colon and rectum. There is a marked amount of desiccated stool identified within the rectum, image 79 of series 2. Vascular/Lymphatic: Calcified atherosclerotic disease involves the abdominal aorta. No aneurysm. No enlarged retroperitoneal or mesenteric adenopathy. No enlarged pelvic or inguinal lymph nodes. Reproductive: Prostate gland is not well visualized. Other: There is no ascites or focal fluid collections within the abdomen or pelvis. Musculoskeletal: Degenerative disc disease is noted throughout the lumbar spine. No fractures identified. IMPRESSION: 1. There is wall thickening involving the sigmoid colon and rectum. Additionally, there is a marked amount of desiccated stool within the rectum. Findings may reflect either stercoral colitis versus mild segmental colitis. No  complications identified. 2. Aortic atherosclerosis. 3. Fat attenuation lesion associated with the left adrenal gland likely reflects a benign process such is a myelolipoma. Electronically Signed   By: Signa Kell M.D.   On: 01/18/2016 14:16   Dg Chest 2 View  01/18/2016  CLINICAL DATA:  Nausea, vomiting, diarrhea. EXAM: CHEST  2 VIEW COMPARISON:  December 31, 2015. FINDINGS: The heart size and mediastinal contours are within normal limits. Both lungs are clear. The visualized skeletal structures are unremarkable. IMPRESSION: No active cardiopulmonary disease. Electronically Signed   By: Lupita Raider, M.D.   On: 01/18/2016 14:30    EKG: Independently  reviewed.  Abnormal findings:  V-paced rhythm    Assessment/Plan  1. Severe sepsis secondary to UTI, ?colitis - Meets criteria on admission with tachypnea, leukocytosis, UTI, AKI, elevated lactate to 7.89  - UA is suggestive of infection, CT abd c/w colitis  - Blood and urine cultures are incubating  - 30 cc/kg NS bolus given in ED  - Empiric vancomycin and Zosyn given in ED  - Continue empiric treatment with vanc and cefepime pending culture data  - Trend lactate, procalcitonin  - Monitor in stepdown unit overnight   2. AKI  - SCr 6.95 on admission, up from apparent baseline of 1.1; BUN is 168  - Nephrology has been consulted by the EDP, their involvement much appreciate  - Suspect this represents a prerenal azotemia in setting of severe sepsis with hypotension; ATN is certainly possible  - Anticipate improvement with aggressive IVF resuscitation  - Repeat chem panel in am    3. Hypertension  - BP has been low here, requiring a total of 4 L NS in ED before stabilizing in appropriate range  - Holding home antihypertensives (lisinopril, metoprolol, Lasix)  - Resume home meds as appropriate when stabilized from infectious and renal standpoints  4. Insulin-dependent type II DM  - Using Levemir 40 units qHS and Humalog 15 units TID at SNF   - Given acute renal failure, will use reduced dose regimen here  - Start with Levemir 30 units qHS and Humalog 5 units TID  - Carb-modified diet when appropriate  - Monitor CBGs with meals and qHS, adjust insulin regimen prn    5. Hypernatremia  - Serum sodium is 152 on admission - Suspect this is an acute rise secondary to dehydration  - Anticipate improvement with fluid resuscitation and treatment of underlying infectious process  - Given the magnitude of elevation and large volume of IVF required in ED, will check another BMP overnight    6. Chronic combined systolic/diastolic CHF  - TTE (12/01/15) with EF 40-45%, grade 1 diastolic dysfunction, mod TR, sev MR  - He has received 4L NS in ED in setting of severe sepsis and hypotension  - Lasix currently on hold d/t hypotension and AKI  - Will likely require significant diuresis when he begins to improve from the infectious process    7. Troponin elevation  - Troponin elevated to 0.09 on admission - Pt denies anginal sxs  - EKG features a v-paced rhythm  - Suspect this is secondary to demand ischemia in setting of severe sepsis  - Trend troponin, monitor on telemetry  - ASA chew given    DVT ppx: SQ Heparin     Code Status: Full code Family Communication: None at bed side.               Disposition Plan: Admit to inpatient   Date of Service 01/18/2016    Briscoe Deutscherimothy S Machai Desmith, MD [facility] Pager [phone]  If 7PM-7AM, please contact night-coverage [email or web address] Password [email or web address] 01/18/2016, 4:47 PM

## 2016-01-18 NOTE — Progress Notes (Addendum)
ANTIBIOTIC CONSULT NOTE-Preliminary  Pharmacy Consult for VANCOMYCIN AND Cefepime Indication: sepsis  Allergies  Allergen Reactions  . Sulfa Antibiotics    Patient Measurements: Height: 5\' 8"  (172.7 cm) Weight: 148 lb (67.132 kg) IBW/kg (Calculated) : 68.4  Vital Signs: Temp: 97.9 F (36.6 C) (04/06 1133) Temp Source: Oral (04/06 1133) BP: 89/36 mmHg (04/06 1230) Pulse Rate: 71 (04/06 1215)  Labs:  Recent Labs  01/18/16 1140  WBC 15.9*  HGB 14.6  PLT 164  CREATININE 6.95*   Estimated Creatinine Clearance: 8 mL/min (by C-G formula based on Cr of 6.95).  No results for input(s): VANCOTROUGH, VANCOPEAK, VANCORANDOM, GENTTROUGH, GENTPEAK, GENTRANDOM, TOBRATROUGH, TOBRAPEAK, TOBRARND, AMIKACINPEAK, AMIKACINTROU, AMIKACIN in the last 72 hours.   Microbiology: Recent Results (from the past 720 hour(s))  Urine culture     Status: None   Collection Time: 12/31/15  3:10 PM  Result Value Ref Range Status   Specimen Description URINE, CATHETERIZED  Final   Special Requests NONE  Final   Culture   Final    >=100,000 COLONIES/mL STAPHYLOCOCCUS SPECIES (COAGULASE NEGATIVE) [facility]    Report Status 01/03/2016 FINAL  Final   Organism ID, Bacteria STAPHYLOCOCCUS SPECIES (COAGULASE NEGATIVE)  Final      Susceptibility   Staphylococcus species (coagulase negative) - MIC*    CIPROFLOXACIN 4 RESISTANT Resistant     GENTAMICIN <=0.5 SENSITIVE Sensitive     NITROFURANTOIN <=16 SENSITIVE Sensitive     OXACILLIN >=4 RESISTANT Resistant     TETRACYCLINE <=1 SENSITIVE Sensitive     VANCOMYCIN 1 SENSITIVE Sensitive     TRIMETH/SULFA <=10 SENSITIVE Sensitive     CLINDAMYCIN <=0.25 SENSITIVE Sensitive     RIFAMPIN <=0.5 SENSITIVE Sensitive     Inducible Clindamycin NEGATIVE Sensitive     * >=100,000 COLONIES/mL STAPHYLOCOCCUS SPECIES (COAGULASE NEGATIVE)   Medical History: Past Medical History  Diagnosis Date  . Essential hypertension, benign   . Type 2  diabetes mellitus (HCC)   . Dementia   . Parkinson's disease   . Insomnia   . Benign prostate hyperplasia   . CHF (congestive heart failure) (HCC)   . Anemia   . Symptomatic bradycardia   . Syncope   . Complete AV block (HCC)   . Legally blind   . At high risk for falls 11/2014    Hx falls  . Pacemaker 12/01/2015    St. Jude   Assessment: Initial doses of Vancomycin and Zosyn ordered to be given in ED.  SCr is elevated on admission, most likely due to dehydration.  Plan:  Preliminary review of pertinent patient information completed.   Vancomycin 1000mg  and Zosyn 3.375gm IV ordered in ED x 1 dose each. Jeani HawkingAnnie Penn clinical pharmacist will complete review after admission to assess patient and finalize treatment regimen.  Wayland DenisHall, Scott A, [facility] CenterRPH 01/18/2016,1:03 PM   PM: Admission Antibiotcs Vancomycin and Cefepime ordered based upon renal function. Initial doses received. Monitor labs, micro and vitals.   Mady GemmaHayes, [place] Bellemare R, [facility] LPRPH  01/18/2016 8:09 PM

## 2016-01-18 NOTE — ED Notes (Signed)
Dr. Clarene DukeMcmanus notified of blood pressure systolic in 80's.  Orders for another 500 cc bolus ns.

## 2016-01-18 NOTE — ED Notes (Signed)
In/out cath done by V. Jeanella CrazePierce EMT J.J. Cruise R.N. witness

## 2016-01-18 NOTE — ED Provider Notes (Signed)
CSN: [id number]     Arrival date & time 01/18/16  1131 History   First MD Initiated Contact with Patient 01/18/16 1202     Chief Complaint  Patient presents with  . Weakness      Patient is a 82 y.o. male presenting with weakness. The history is provided by the patient, the EMS personnel and the nursing home. The history is limited by the condition of the patient (Hx dementia).  Weakness  Pt was seen at 1205. Per EMS and NH report: Pt sent to ED for multiple intermittent episodes of N/V/D as well as generalized weakness. Pt states his "stomach hurts" but cannot be more specific. Denies CP/SOB, no reported fevers.   Past Medical History  Diagnosis Date  . Essential hypertension, benign   . Type 2 diabetes mellitus (HCC)   . Dementia   . Parkinson's disease   . Insomnia   . Benign prostate hyperplasia   . CHF (congestive heart failure) (HCC)   . Anemia   . Symptomatic bradycardia   . Syncope   . Complete AV block (HCC)   . Legally blind   . At high risk for falls 11/2014    Hx falls  . Pacemaker 12/01/2015    [employer]   Past Surgical History  Procedure Laterality Date  . Cardiac catheterization N/A 11/30/2015    Procedure: Temporary Pacemaker;  Surgeon: Orpah Cobb, MD;  Location: [facility] INVASIVE CV LAB;  Service: Cardiovascular;  Laterality: N/A;  . Ep implantable device N/A 12/01/2015    Procedure: Pacemaker Implant;  Surgeon: Duke Salvia, MD;  Location: [facility] INVASIVE CV LAB;  Service: Cardiovascular;  Laterality: N/A;   Family History  Problem Relation Age of Onset  . Family history unknown: Yes   Social History  Substance Use Topics  . Smoking status: Former Smoker -- 0.50 packs/day for 4 years    Types: Cigarettes  . Smokeless tobacco: Never Used  . Alcohol Use: No    Review of Systems  Unable to perform ROS: Dementia  Neurological: Positive for weakness.      Allergies  Sulfa antibiotics  Home Medications   Prior to Admission medications   Medication Sig  Start Date End Date Taking? Authorizing Provider  aspirin EC 81 MG tablet Take 81 mg by mouth daily.     Yes Historical Provider, MD  Camphor-Eucalyptus-Menthol (MEDICATED CHEST RUB EX) Apply 1 application topically 2 (two) times daily.   Yes Historical Provider, MD  clotrimazole-betamethasone (LOTRISONE) cream Apply 1 application topically 2 (two) times daily as needed (Applied to scrotum area in a small amount as needed for rash). Reported on 12/13/2015   Yes Historical Provider, MD  donepezil (ARICEPT) 5 MG tablet Take 1 tablet (5 mg total) by mouth at bedtime. 01/02/16  Yes Houston Siren, MD  escitalopram (LEXAPRO) 10 MG tablet Take 10 mg by mouth daily.   Yes Historical Provider, MD  fluticasone (FLONASE) 50 MCG/ACT nasal spray Place 1 spray into both nostrils daily.    Yes Historical Provider, MD  furosemide (LASIX) 40 MG tablet Take 1 tablet (40 mg total) by mouth daily. 12/09/15  Yes Erick Blinks, MD  haloperidol (HALDOL) 1 MG tablet Take 1 mg by mouth at bedtime.     Yes Historical Provider, MD  insulin detemir (LEVEMIR) 100 UNIT/ML injection Inject 0.4 mLs (40 Units total) into the skin at bedtime. 09/28/15  Yes Roma Kayser, MD  insulin lispro (HUMALOG) 100 UNIT/ML injection Inject 15 Units into the skin  3 (three) times daily before meals. Give if blood sugars are over 150   Yes Historical Provider, MD  linagliptin (TRADJENTA) 5 MG TABS tablet Take 5 mg by mouth daily.   Yes Historical Provider, MD  lisinopril (PRINIVIL,ZESTRIL) 2.5 MG tablet Take 1 tablet (2.5 mg total) by mouth daily. 12/09/15  Yes Erick Blinks, MD  metoprolol succinate (TOPROL XL) 25 MG 24 hr tablet Take 1 tablet (25 mg total) by mouth daily. 12/09/15  Yes Erick Blinks, MD  Tamsulosin HCl (FLOMAX) 0.4 MG CAPS Take 0.4 mg by mouth daily.     Yes Historical Provider, MD  Thiamine HCl (VITAMIN B-1) 100 MG tablet Take 100 mg by mouth daily.     Yes Historical Provider, MD  insulin detemir (LEVEMIR) 100 UNIT/ML injection  Inject 0.3 mLs (30 Units total) into the skin at bedtime. Patient not taking: Reported on 01/18/2016 01/03/16   Houston Siren, MD   BP 107/55 mmHg  Pulse 68  Temp(Src) 97.9 F (36.6 C) (Oral)  Resp 16  Ht  (1.727 m)  Wt 148 lb (67.132 kg)  BMI 22.51 kg/m2  SpO2 100%    Patient Vitals for the past 24 hrs:  BP Temp Temp src Pulse Resp SpO2 Height Weight  01/18/16 1430 107/55 mmHg - - 68 16 100 % - -  01/18/16 1415 (!) 96/40 mmHg - - 63 21 99 % - -  01/18/16 1400 (!) 104/40 mmHg - - - 20 - - -  01/18/16 1345 (!) 107/50 mmHg - - - 18 - - -  01/18/16 1330 (!) 85/56 mmHg - - (!) 37 21 98 % - -  01/18/16 1328 - - - - - -  (1.727 m) 148 lb (67.132 kg)  01/18/16 1315 (!) 86/34 mmHg - - - 26 - - -  01/18/16 1230 (!) 89/36 mmHg - - - 26 - - -  01/18/16 1215 (!) 75/41 mmHg - - 71 20 100 % - -  01/18/16 1200 (!) 94/44 mmHg - - 69 (!) 35 99 % - -  01/18/16 1158 90/57 mmHg - - 76 23 (!) 71 % - -  01/18/16 1150 (!) 68/42 mmHg - - - 22 - - -  01/18/16 1141 (!) 60/30 mmHg - - - - - - -  01/18/16 1133 - 97.9 F (36.6 C) Oral 83 24 98 %  (1.727 m) 148 lb (67.132 kg)     Physical Exam  1210: Physical examination:  Nursing notes reviewed; Vital signs and O2 SAT reviewed;  Constitutional: Well developed, Well nourished, In no acute distress; Head:  Normocephalic, atraumatic; Eyes: EOMI, PERRL, No scleral icterus; ENMT: Mouth and pharynx normal, Mucous membranes dry; Neck: Supple, Full range of motion, No lymphadenopathy; Cardiovascular: Regular rate and rhythm, No gallop; Respiratory: Breath sounds clear & equal bilaterally, No wheezes.  Speaking full sentences with ease, Normal respiratory effort/excursion; Chest: Nontender, Movement normal; Abdomen: Soft, Nontender, Nondistended, Normal bowel sounds; Genitourinary: No CVA tenderness; Extremities: Pulses normal, No tenderness, No edema, No calf edema or asymmetry.; Neuro: Awake, alert, confused per hx dementia. No facial droop. Speech clear.  Moves extremities spontaneously on stretcher.; Skin: Color normal, Warm, Dry.   ED Course  Procedures (including critical care time) Labs Review   Imaging Review  I have personally reviewed and evaluated these images and lab results as part of my medical decision-making.   EKG Interpretation   Date/Time:  Thursday January 18 2016 11:47:07 EDT Ventricular Rate:  79 PR  Interval:  158 QRS Duration: 164 QT Interval:  496 QTC Calculation: 569 R Axis:   -82 Text Interpretation:  Atrial-sensed ventricular-paced rhythm No further  analysis attempted due to paced rhythm Baseline wander When compared with  ECG of 12/31/2015 No significant change was found Confirmed by [facility]  MD,  Nicholos Johns [phone]) on 01/18/2016 1:24:15 PM      MDM  MDM Reviewed: previous chart, nursing note and vitals Reviewed previous: labs and ECG Interpretation: labs, x-ray, ECG and CT scan Total time providing critical care: 30-74 minutes. This excludes time spent performing separately reportable procedures and services. Consults: admitting MD     CRITICAL CARE Performed by: Laray Anger Total critical care time: 35 minutes Critical care time was exclusive of separately billable procedures and treating other patients. Critical care was necessary to treat or prevent imminent or life-threatening deterioration. Critical care was time spent personally by me on the following activities: development of treatment plan with patient and/or surrogate as well as nursing, discussions with consultants, evaluation of patient's response to treatment, examination of patient, obtaining history from patient or surrogate, ordering and performing treatments and interventions, ordering and review of laboratory studies, ordering and review of radiographic studies, pulse oximetry and re-evaluation of patient's condition.   Results for orders placed or performed during the hospital encounter of 01/18/16  Lipase, blood  Result  Value Ref Range   Lipase 56 (H) 11 - 51 U/L  Comprehensive metabolic panel  Result Value Ref Range   Sodium 152 (H) 135 - 145 mmol/L   Potassium 4.2 3.5 - 5.1 mmol/L   Chloride 107 101 - 111 mmol/L   CO2 21 (L) 22 - 32 mmol/L   Glucose, Bld 103 (H) 65 - 99 mg/dL   BUN 604 (H) 6 - 20 mg/dL   Creatinine, Ser 5.40 (H) 0.61 - 1.24 mg/dL   Calcium 9.5 8.9 - 98.1 mg/dL   Total Protein 7.5 6.5 - 8.1 g/dL   Albumin 3.6 3.5 - 5.0 g/dL   AST 44 (H) 15 - 41 U/L   ALT 40 17 - 63 U/L   Alkaline Phosphatase 90 38 - 126 U/L   Total Bilirubin 0.5 0.3 - 1.2 mg/dL   GFR calc non Af Amer 7 (L) >60 mL/min   GFR calc Af Amer 8 (L) >60 mL/min   Anion gap 24 (H) 5 - 15  CBC  Result Value Ref Range   WBC 15.9 (H) 4.0 - 10.5 K/uL   RBC 5.14 4.22 - 5.81 MIL/uL   Hemoglobin 14.6 13.0 - 17.0 g/dL   HCT 19.1 47.8 - 29.5 %   MCV 87.4 78.0 - 100.0 fL   MCH 28.4 26.0 - 34.0 pg   MCHC 32.5 30.0 - 36.0 g/dL   RDW 62.1 30.8 - 65.7 %   Platelets 164 150 - 400 K/uL  Urinalysis, Routine w reflex microscopic (not at [facility])  Result Value Ref Range   Color, Urine YELLOW YELLOW   APPearance HAZY (A) CLEAR   Specific Gravity, Urine 1.015 1.005 - 1.030   pH 6.0 5.0 - 8.0   Glucose, UA NEGATIVE NEGATIVE mg/dL   Hgb urine dipstick SMALL (A) NEGATIVE   Bilirubin Urine NEGATIVE NEGATIVE   Ketones, ur NEGATIVE NEGATIVE mg/dL   Protein, ur 846 (A) NEGATIVE mg/dL   Nitrite NEGATIVE NEGATIVE   Leukocytes, UA MODERATE (A) NEGATIVE  Troponin I  Result Value Ref Range   Troponin I 0.09 (H) <0.031 ng/mL  Urine microscopic-add on  Result  Value Ref Range   Squamous Epithelial / LPF NONE SEEN NONE SEEN   WBC, UA TOO NUMEROUS TO COUNT 0 - 5 WBC/hpf   RBC / HPF 0-5 0 - 5 RBC/hpf   Bacteria, UA MANY (A) NONE SEEN  I-Stat CG4 Lactic Acid, ED  Result Value Ref Range   Lactic Acid, Venous 7.89 (HH) 0.5 - 2.0 mmol/L   Comment NOTIFIED PHYSICIAN    Ct Abdomen Pelvis Wo Contrast 01/18/2016  CLINICAL DATA:  Nausea, vomiting and  diarrhea with generalized abdominal pain EXAM: CT ABDOMEN AND PELVIS WITHOUT CONTRAST TECHNIQUE: Multidetector CT imaging of the abdomen and pelvis was performed following the standard protocol without IV contrast. COMPARISON:  None. FINDINGS: Lower chest: No pleural effusion identified. Dependent changes are in the lung bases. Hepatobiliary: No suspicious liver abnormalities identified. The gallbladder appears normal. There is no biliary dilatation. Pancreas: Normal appearance of the pancreas Spleen: Normal in size. Adrenals/Urinary Tract: Fat attenuation lesion within the left adrenal gland measures 3.7 cm, image 28 of series 2. This is new from previous exam. Normal appearance of the right adrenal gland. The kidneys are unremarkable. Mild diffuse circumferential wall thickening involving the urinary bladder. Stomach/Bowel: The stomach appears normal. The small bowel loops have a normal course and caliber. No bowel obstruction. The appendix is visualized and appears normal. The scratch set there is wall thickening involving the sigmoid colon and rectum. There is a marked amount of desiccated stool identified within the rectum, image 79 of series 2. Vascular/Lymphatic: Calcified atherosclerotic disease involves the abdominal aorta. No aneurysm. No enlarged retroperitoneal or mesenteric adenopathy. No enlarged pelvic or inguinal lymph nodes. Reproductive: Prostate gland is not well visualized. Other: There is no ascites or focal fluid collections within the abdomen or pelvis. Musculoskeletal: Degenerative disc disease is noted throughout the lumbar spine. No fractures identified. IMPRESSION: 1. There is wall thickening involving the sigmoid colon and rectum. Additionally, there is a marked amount of desiccated stool within the rectum. Findings may reflect either stercoral colitis versus mild segmental colitis. No complications identified. 2. Aortic atherosclerosis. 3. Fat attenuation lesion associated with the left  adrenal gland likely reflects a benign process such is a myelolipoma. Electronically Signed   By: Signa Kellaylor  Stroud M.D.   On: 01/18/2016 14:16   Dg Chest 2 View 01/18/2016  CLINICAL DATA:  Nausea, vomiting, diarrhea. EXAM: CHEST  2 VIEW COMPARISON:  December 31, 2015. FINDINGS: The heart size and mediastinal contours are within normal limits. Both lungs are clear. The visualized skeletal structures are unremarkable. IMPRESSION: No active cardiopulmonary disease. Electronically Signed   By: Lupita RaiderJames  Green Jr, M.D.   On: 01/18/2016 14:30   Results for Montel CulverBELTON, Ivo E (MRN [id number]) as of 01/18/2016 14:47  Ref. Range 12/07/2015 21:44 12/09/2015 06:50 12/19/2015 07:57 12/31/2015 12:45 01/01/2016 06:42 01/18/2016 11:40  BUN Latest Ref Range: 6-20 mg/dL 30 (H) 40 (H) 55 (H) 63 (H) 55 (H) 168 (H)  Creatinine Latest Ref Range: 0.61-1.24 mg/dL 4.091.08 8.111.33 (H) 9.141.49 (H) 2.33 (H) 1.57 (H) 6.95 (H)    1445:  Pt initially hypotensive, afebrile. IVF bolus given. Lactic acid elevated; code sepsis activated. BP slowly increasing with IVF. IV abx given after BC and UC obtained. New ARF on labs. No vomiting or diarrhea since ED arrival. ED RN states pt had small formed stool on arrival to ED.  T/C to Renal Dr. Kristian CoveyBefekadu, case discussed, including:  HPI, pertinent PM/SHx, VS/PE, dx testing, ED course and treatment:  Agreeable to consult, requests to continue  IVF for dehydration, admit to [facility].   1530:  T/C to [facility] Dr. Antionette Char, case discussed, including:  HPI, pertinent PM/SHx, VS/PE, dx testing, ED course and treatment:  Agreeable to admit, requests to write temporary orders, obtain stepdown bed to team APAdmits.   Samuel Jester, DO 01/22/16 [phone]

## 2016-01-19 ENCOUNTER — Ambulatory Visit (INDEPENDENT_AMBULATORY_CARE_PROVIDER_SITE_OTHER): Payer: Medicare Other | Admitting: Internal Medicine

## 2016-01-19 DIAGNOSIS — K529 Noninfective gastroenteritis and colitis, unspecified: Secondary | ICD-10-CM

## 2016-01-19 DIAGNOSIS — N390 Urinary tract infection, site not specified: Secondary | ICD-10-CM

## 2016-01-19 LAB — CBC WITH DIFFERENTIAL/PLATELET
Basophils Absolute: 0 10*3/uL (ref 0.0–0.1)
Basophils Relative: 0 %
EOS ABS: 0.1 10*3/uL (ref 0.0–0.7)
EOS PCT: 1 %
HCT: 37.9 % — ABNORMAL LOW (ref 39.0–52.0)
Hemoglobin: 12 g/dL — ABNORMAL LOW (ref 13.0–17.0)
LYMPHS ABS: 2.3 10*3/uL (ref 0.7–4.0)
LYMPHS PCT: 19 %
MCH: 28.2 pg (ref 26.0–34.0)
MCHC: 31.7 g/dL (ref 30.0–36.0)
MCV: 89.2 fL (ref 78.0–100.0)
MONO ABS: 1 10*3/uL (ref 0.1–1.0)
MONOS PCT: 8 %
Neutro Abs: 8.6 10*3/uL — ABNORMAL HIGH (ref 1.7–7.7)
Neutrophils Relative %: 72 %
PLATELETS: 144 10*3/uL — AB (ref 150–400)
RBC: 4.25 MIL/uL (ref 4.22–5.81)
RDW: 14.3 % (ref 11.5–15.5)
WBC: 12 10*3/uL — ABNORMAL HIGH (ref 4.0–10.5)

## 2016-01-19 LAB — BASIC METABOLIC PANEL
Anion gap: 16 — ABNORMAL HIGH (ref 5–15)
BUN: 138 mg/dL — AB (ref 6–20)
CHLORIDE: 122 mmol/L — AB (ref 101–111)
CO2: 16 mmol/L — ABNORMAL LOW (ref 22–32)
CREATININE: 4.6 mg/dL — AB (ref 0.61–1.24)
Calcium: 7.8 mg/dL — ABNORMAL LOW (ref 8.9–10.3)
GFR calc Af Amer: 13 mL/min — ABNORMAL LOW (ref >60)
GFR calc non Af Amer: 11 mL/min — ABNORMAL LOW (ref >60)
GLUCOSE: 162 mg/dL — AB (ref 65–99)
POTASSIUM: 4.1 mmol/L (ref 3.5–5.1)
Sodium: 154 mmol/L — ABNORMAL HIGH (ref 135–145)

## 2016-01-19 LAB — GASTROINTESTINAL PANEL BY PCR, STOOL (REPLACES STOOL CULTURE)
Adenovirus F40/41: NOT DETECTED
Astrovirus: NOT DETECTED
Campylobacter species: NOT DETECTED
Cryptosporidium: NOT DETECTED
Cyclospora cayetanensis: NOT DETECTED
E. coli O157: NOT DETECTED
ENTEROTOXIGENIC E COLI (ETEC): NOT DETECTED
Entamoeba histolytica: NOT DETECTED
Enteroaggregative E coli (EAEC): NOT DETECTED
Enteropathogenic E coli (EPEC): NOT DETECTED
Giardia lamblia: NOT DETECTED
NOROVIRUS GI/GII: NOT DETECTED
PLESIMONAS SHIGELLOIDES: NOT DETECTED
ROTAVIRUS A: NOT DETECTED
SALMONELLA SPECIES: NOT DETECTED
SAPOVIRUS (I, II, IV, AND V): NOT DETECTED
SHIGELLA/ENTEROINVASIVE E COLI (EIEC): NOT DETECTED
Shiga like toxin producing E coli (STEC): NOT DETECTED
Vibrio cholerae: NOT DETECTED
Vibrio species: NOT DETECTED
Yersinia enterocolitica: NOT DETECTED

## 2016-01-19 LAB — GLUCOSE, CAPILLARY
GLUCOSE-CAPILLARY: 128 mg/dL — AB (ref 65–99)
Glucose-Capillary: 123 mg/dL — ABNORMAL HIGH (ref 65–99)
Glucose-Capillary: 149 mg/dL — ABNORMAL HIGH (ref 65–99)
Glucose-Capillary: 123 mg/dL — ABNORMAL HIGH (ref 65–99)

## 2016-01-19 LAB — HEMOGLOBIN A1C
Hgb A1c MFr Bld: 9.8 % — ABNORMAL HIGH (ref 4.8–5.6)
Mean Plasma Glucose: 235 mg/dL

## 2016-01-19 LAB — URINE CULTURE: Culture: NO GROWTH

## 2016-01-19 LAB — OSMOLALITY, URINE: Osmolality, Ur: 373 mOsm/kg (ref 300–900)

## 2016-01-19 LAB — SODIUM, URINE, RANDOM: Sodium, Ur: 72 mmol/L

## 2016-01-19 MED ORDER — MUPIROCIN 2 % EX OINT
1.0000 "application " | TOPICAL_OINTMENT | Freq: Two times a day (BID) | CUTANEOUS | Status: AC
  Administered 2016-01-19 – 2016-01-24 (×10): 1 via NASAL
  Filled 2016-01-19 (×2): qty 22

## 2016-01-19 MED ORDER — CHLORHEXIDINE GLUCONATE CLOTH 2 % EX PADS
6.0000 | MEDICATED_PAD | Freq: Every day | CUTANEOUS | Status: AC
  Administered 2016-01-20 – 2016-01-24 (×5): 6 via TOPICAL

## 2016-01-19 MED ORDER — DEXTROSE-NACL 5-0.45 % IV SOLN
INTRAVENOUS | Status: DC
  Administered 2016-01-19 – 2016-01-20 (×3): 1000 mL via INTRAVENOUS

## 2016-01-19 NOTE — Consult Note (Signed)
Reason for Consult: Acute kidney injury Referring Physician: Dr.Memon  Cody [name] is an 82 y.o. male.  HPI: He is a patient who has history of diabetes, hypertension, dementia presently sent from nursing home because of nausea, vomiting and abdominal pain. When he was evaluated in the emergency room he was found to have hypernatremia, acute kidney injury and possibly sepsis. Presently patient doesn't seem to offer any complaints. No history of underlying chronic renal failure.  Past Medical History  Diagnosis Date  . Essential hypertension, benign   . Type 2 diabetes mellitus ([place])   . Dementia   . Parkinson'[name] disease   . Insomnia   . Benign prostate hyperplasia   . CHF (congestive heart failure) ([place])   . Anemia   . Symptomatic bradycardia   . Syncope   . Complete AV block ([place])   . Legally blind   . At high risk for falls 11/2014    Hx falls  . Pacemaker 12/01/2015    [employer]    Past Surgical History  Procedure Laterality Date  . Cardiac catheterization N/A 11/30/2015    Procedure: Temporary Pacemaker;  Surgeon: Dixie Dials, MD;  Location: [place] [facility];  Service: Cardiovascular;  Laterality: N/A;  . Ep implantable device N/A 12/01/2015    Procedure: Pacemaker Implant;  Surgeon: Deboraha Sprang, MD;  Location: [place] [facility];  Service: Cardiovascular;  Laterality: N/A;    Family History  Problem Relation Age of Onset  . Family history unknown: Yes    Social History:  reports that he has quit smoking. His smoking use included Cigarettes. He has a 2 pack-year smoking history. He has never used smokeless tobacco. He reports that he does not drink alcohol or use illicit drugs.  Allergies:  Allergies  Allergen Reactions  . Sulfa Antibiotics     Medications: I have reviewed the patient'[name].  Results for orders placed or performed during the hospital encounter of 01/18/16 (from the past 48 hour([name])  Lipase, blood     Status: Abnormal    Collection Time: 01/18/16 11:40 AM  Result Value Ref Range   Lipase 56 (H) 11 - 51 U/L  Comprehensive metabolic panel     Status: Abnormal   Collection Time: 01/18/16 11:40 AM  Result Value Ref Range   Sodium 152 (H) 135 - 145 mmol/L   Potassium 4.2 3.5 - 5.1 mmol/L   Chloride 107 101 - 111 mmol/L   CO2 21 (L) 22 - 32 mmol/L   Glucose, Bld 103 (H) 65 - 99 mg/dL   BUN 168 (H) 6 - 20 mg/dL    Comment: RESULTS CONFIRMED BY MANUAL DILUTION   Creatinine, Ser 6.95 (H) 0.61 - 1.24 mg/dL   Calcium 9.5 8.9 - 10.3 mg/dL   Total Protein 7.5 6.5 - 8.1 g/dL   Albumin 3.6 3.5 - 5.0 g/dL   AST 44 (H) 15 - 41 U/L   ALT 40 17 - 63 U/L   Alkaline Phosphatase 90 38 - 126 U/L   Total Bilirubin 0.5 0.3 - 1.2 mg/dL   GFR calc non Af Amer 7 (L) >60 mL/min   GFR calc Af Amer 8 (L) >60 mL/min    Comment: (NOTE) The eGFR has been calculated using the CKD EPI equation. This calculation has not been validated in all clinical situations. eGFR'[name] persistently <60 mL/min signify possible Chronic Kidney Disease.    Anion gap 24 (H) 5 - 15  CBC     Status:  Abnormal   Collection Time: 01/18/16 11:40 AM  Result Value Ref Range   WBC 15.9 (H) 4.0 - 10.5 K/uL   RBC 5.14 4.22 - 5.81 MIL/uL   Hemoglobin 14.6 13.0 - 17.0 g/dL   HCT 44.9 39.0 - 52.0 %   MCV 87.4 78.0 - 100.0 fL   MCH 28.4 26.0 - 34.0 pg   MCHC 32.5 30.0 - 36.0 g/dL   RDW 13.9 11.5 - 15.5 %   Platelets 164 150 - 400 K/uL  Troponin I     Status: Abnormal   Collection Time: 01/18/16 11:40 AM  Result Value Ref Range   Troponin I 0.09 (H) <0.031 ng/mL    Comment:        PERSISTENTLY INCREASED TROPONIN VALUES IN THE RANGE OF 0.04-0.49 ng/mL CAN BE SEEN IN:       -UNSTABLE ANGINA       -CONGESTIVE HEART FAILURE       -MYOCARDITIS       -CHEST TRAUMA       -ARRYHTHMIAS       -LATE PRESENTING MYOCARDIAL INFARCTION       -COPD   CLINICAL FOLLOW-UP RECOMMENDED.   I-Stat CG4 Lactic Acid, ED     Status: Abnormal   Collection Time: 01/18/16  12:34 PM  Result Value Ref Range   Lactic Acid, Venous 7.89 (HH) 0.5 - 2.0 mmol/L   Comment NOTIFIED PHYSICIAN   Urinalysis, Routine w reflex microscopic (not at [facility])     Status: Abnormal   Collection Time: 01/18/16  1:15 PM  Result Value Ref Range   Color, Urine YELLOW YELLOW   APPearance HAZY (A) CLEAR   Specific Gravity, Urine 1.015 1.005 - 1.030   pH 6.0 5.0 - 8.0   Glucose, UA NEGATIVE NEGATIVE mg/dL   Hgb urine dipstick SMALL (A) NEGATIVE   Bilirubin Urine NEGATIVE NEGATIVE   Ketones, ur NEGATIVE NEGATIVE mg/dL   Protein, ur 100 (A) NEGATIVE mg/dL   Nitrite NEGATIVE NEGATIVE   Leukocytes, UA MODERATE (A) NEGATIVE  Urine microscopic-add on     Status: Abnormal   Collection Time: 01/18/16  1:15 PM  Result Value Ref Range   Squamous Epithelial / LPF NONE SEEN NONE SEEN   WBC, UA TOO NUMEROUS TO COUNT 0 - 5 WBC/hpf   RBC / HPF 0-5 0 - 5 RBC/hpf   Bacteria, UA MANY (A) NONE SEEN  Lactic acid, plasma     Status: None   Collection Time: 01/18/16  4:43 PM  Result Value Ref Range   Lactic Acid, Venous 2.0 0.5 - 2.0 mmol/L  Procalcitonin     Status: None   Collection Time: 01/18/16  4:43 PM  Result Value Ref Range   Procalcitonin 0.44 ng/mL    Comment:        Interpretation: PCT (Procalcitonin) <= 0.5 ng/mL: Systemic infection (sepsis) is not likely. Local bacterial infection is possible. (NOTE)         ICU PCT Algorithm               Non ICU PCT Algorithm    ----------------------------     ------------------------------         PCT < 0.25 ng/mL                 PCT < 0.1 ng/mL     Stopping of antibiotics            Stopping of antibiotics       strongly encouraged.  strongly encouraged.    ----------------------------     ------------------------------       PCT level decrease by               PCT < 0.25 ng/mL       >= 80% from peak PCT       OR PCT 0.25 - 0.5 ng/mL          Stopping of antibiotics                                             encouraged.      Stopping of antibiotics           encouraged.    ----------------------------     ------------------------------       PCT level decrease by              PCT >= 0.25 ng/mL       < 80% from peak PCT        AND PCT >= 0.5 ng/mL            Continuin g antibiotics                                              encouraged.       Continuing antibiotics            encouraged.    ----------------------------     ------------------------------     PCT level increase compared          PCT > 0.5 ng/mL         with peak PCT AND          PCT >= 0.5 ng/mL             Escalation of antibiotics                                          strongly encouraged.      Escalation of antibiotics        strongly encouraged.   Hemoglobin A1c     Status: Abnormal   Collection Time: 01/18/16  4:43 PM  Result Value Ref Range   Hgb A1c MFr Bld 9.8 (H) 4.8 - 5.6 %    Comment: (NOTE)         Pre-diabetes: 5.7 - 6.4         Diabetes: >6.4         Glycemic control for adults with diabetes: <7.0    Mean Plasma Glucose 235 mg/dL    Comment: (NOTE) Performed At: [facility] [place], [place] [id number] Lindon Romp MD WU:[id number]   MRSA PCR Screening     Status: Abnormal   Collection Time: 01/18/16  5:20 PM  Result Value Ref Range   MRSA by PCR POSITIVE (A) NEGATIVE    Comment:        The GeneXpert MRSA Assay (FDA approved for NASAL specimens only), is one component of a comprehensive MRSA colonization surveillance program. It is not intended to diagnose MRSA infection nor to guide or monitor treatment for MRSA infections. RESULT CALLED TO, READ BACK BY AND  VERIFIED WITH: WAGONER,R. RN AT 8250 ON 01/18/2016 BY Elza Rafter.   Protime-INR     Status: Abnormal   Collection Time: 01/18/16  5:41 PM  Result Value Ref Range   Prothrombin Time 16.7 (H) 11.6 - 15.2 seconds   INR 1.34 0.00 - 1.49  APTT     Status: None   Collection Time: 01/18/16  5:41 PM  Result Value Ref Range    aPTT 28 24 - 37 seconds  Glucose, capillary     Status: None   Collection Time: 01/18/16  5:43 PM  Result Value Ref Range   Glucose-Capillary 81 65 - 99 mg/dL   Comment 1 Notify RN    Comment 2 Document in Chart   C difficile quick scan w PCR reflex     Status: None   Collection Time: 01/18/16  9:10 PM  Result Value Ref Range   C Diff antigen NEGATIVE NEGATIVE   C Diff toxin NEGATIVE NEGATIVE   C Diff interpretation Negative for toxigenic C. difficile   Glucose, capillary     Status: Abnormal   Collection Time: 01/18/16  9:38 PM  Result Value Ref Range   Glucose-Capillary 128 (H) 65 - 99 mg/dL   Comment 1 Notify RN   Basic metabolic panel     Status: Abnormal   Collection Time: 01/18/16 10:52 PM  Result Value Ref Range   Sodium 149 (H) 135 - 145 mmol/L   Potassium 4.1 3.5 - 5.1 mmol/L   Chloride 118 (H) 101 - 111 mmol/L   CO2 17 (L) 22 - 32 mmol/L   Glucose, Bld 157 (H) 65 - 99 mg/dL   BUN 144 (H) 6 - 20 mg/dL    Comment: RESULTS CONFIRMED BY MANUAL DILUTION   Creatinine, Ser 5.13 (H) 0.61 - 1.24 mg/dL   Calcium 7.3 (L) 8.9 - 10.3 mg/dL    Comment: DELTA CHECK NOTED   GFR calc non Af Amer 10 (L) >60 mL/min   GFR calc Af Amer 11 (L) >60 mL/min    Comment: (NOTE) The eGFR has been calculated using the CKD EPI equation. This calculation has not been validated in all clinical situations. eGFR'[name] persistently <60 mL/min signify possible Chronic Kidney Disease.    Anion gap 14 5 - 15  Basic metabolic panel     Status: Abnormal   Collection Time: 01/19/16  4:14 AM  Result Value Ref Range   Sodium 154 (H) 135 - 145 mmol/L   Potassium 4.1 3.5 - 5.1 mmol/L   Chloride 122 (H) 101 - 111 mmol/L   CO2 16 (L) 22 - 32 mmol/L   Glucose, Bld 162 (H) 65 - 99 mg/dL   BUN 138 (H) 6 - 20 mg/dL    Comment: RESULTS CONFIRMED BY MANUAL DILUTION   Creatinine, Ser 4.60 (H) 0.61 - 1.24 mg/dL   Calcium 7.8 (L) 8.9 - 10.3 mg/dL   GFR calc non Af Amer 11 (L) >60 mL/min   GFR calc Af Amer 13 (L)  >60 mL/min    Comment: (NOTE) The eGFR has been calculated using the CKD EPI equation. This calculation has not been validated in all clinical situations. eGFR'[name] persistently <60 mL/min signify possible Chronic Kidney Disease.    Anion gap 16 (H) 5 - 15  CBC WITH DIFFERENTIAL     Status: Abnormal   Collection Time: 01/19/16  4:14 AM  Result Value Ref Range   WBC 12.0 (H) 4.0 - 10.5 K/uL   RBC 4.25 4.22 - 5.81 MIL/uL  Hemoglobin 12.0 (L) 13.0 - 17.0 g/dL   HCT 37.9 (L) 39.0 - 52.0 %   MCV 89.2 78.0 - 100.0 fL   MCH 28.2 26.0 - 34.0 pg   MCHC 31.7 30.0 - 36.0 g/dL   RDW 14.3 11.5 - 15.5 %   Platelets 144 (L) 150 - 400 K/uL   Neutrophils Relative % 72 %   Neutro Abs 8.6 (H) 1.7 - 7.7 K/uL   Lymphocytes Relative 19 %   Lymphs Abs 2.3 0.7 - 4.0 K/uL   Monocytes Relative 8 %   Monocytes Absolute 1.0 0.1 - 1.0 K/uL   Eosinophils Relative 1 %   Eosinophils Absolute 0.1 0.0 - 0.7 K/uL   Basophils Relative 0 %   Basophils Absolute 0.0 0.0 - 0.1 K/uL  Glucose, capillary     Status: Abnormal   Collection Time: 01/19/16  7:31 AM  Result Value Ref Range   Glucose-Capillary 123 (H) 65 - 99 mg/dL    Ct Abdomen Pelvis Wo Contrast  01/18/2016  CLINICAL DATA:  Nausea, vomiting and diarrhea with generalized abdominal pain EXAM: CT ABDOMEN AND PELVIS WITHOUT CONTRAST TECHNIQUE: Multidetector CT imaging of the abdomen and pelvis was performed following the standard protocol without IV contrast. COMPARISON:  None. FINDINGS: Lower chest: No pleural effusion identified. Dependent changes are in the lung bases. Hepatobiliary: No suspicious liver abnormalities identified. The gallbladder appears normal. There is no biliary dilatation. Pancreas: Normal appearance of the pancreas Spleen: Normal in size. Adrenals/Urinary Tract: Fat attenuation lesion within the left adrenal gland measures 3.7 cm, image 28 of series 2. This is new from previous exam. Normal appearance of the right adrenal gland. The kidneys  are unremarkable. Mild diffuse circumferential wall thickening involving the urinary bladder. Stomach/Bowel: The stomach appears normal. The small bowel loops have a normal course and caliber. No bowel obstruction. The appendix is visualized and appears normal. The scratch set there is wall thickening involving the sigmoid colon and rectum. There is a marked amount of desiccated stool identified within the rectum, image 79 of series 2. Vascular/Lymphatic: Calcified atherosclerotic disease involves the abdominal aorta. No aneurysm. No enlarged retroperitoneal or mesenteric adenopathy. No enlarged pelvic or inguinal lymph nodes. Reproductive: Prostate gland is not well visualized. Other: There is no ascites or focal fluid collections within the abdomen or pelvis. Musculoskeletal: Degenerative disc disease is noted throughout the lumbar spine. No fractures identified. IMPRESSION: 1. There is wall thickening involving the sigmoid colon and rectum. Additionally, there is a marked amount of desiccated stool within the rectum. Findings may reflect either stercoral colitis versus mild segmental colitis. No complications identified. 2. Aortic atherosclerosis. 3. Fat attenuation lesion associated with the left adrenal gland likely reflects a benign process such is a myelolipoma. Electronically Signed   By: Kerby Moors M.D.   On: 01/18/2016 14:16   Dg Chest 2 View  01/18/2016  CLINICAL DATA:  Nausea, vomiting, diarrhea. EXAM: CHEST  2 VIEW COMPARISON:  December 31, 2015. FINDINGS: The heart size and mediastinal contours are within normal limits. Both lungs are clear. The visualized skeletal structures are unremarkable. IMPRESSION: No active cardiopulmonary disease. Electronically Signed   By: Marijo Conception, M.D.   On: 01/18/2016 14:30    Review of Systems  Constitutional: Negative for fever and chills.  Respiratory: Negative for shortness of breath.   Cardiovascular: Negative for orthopnea.  Gastrointestinal:  Positive for nausea and abdominal pain. Negative for vomiting and diarrhea.  Neurological: Positive for weakness.   Blood pressure 106/88,  pulse 74, temperature 97.8 F (36.6 C), temperature source Axillary, resp. rate 18, height 5' 8"  (1.727 m), weight 63.6 kg (140 lb 3.4 oz), SpO2 100 %. Physical Exam  Constitutional: No distress.  Patient is blind bilaterally. Presently patient answers questions however he only states yes or no. Patient with history of dementia.  Neck: No JVD present.  Cardiovascular: Normal rate and regular rhythm.   No murmur heard. Respiratory: He has no rales.  GI: He exhibits no distension. There is no tenderness.  Musculoskeletal: He exhibits no edema.  Neurological: He is alert.    Assessment/Plan: Problem #1 acute kidney injury: Possibly secondary to severe prerenal syndrome versus ATN. His creatinine on 07/16/2015 was 1.02 and increased to 1.33 on 12/09/15. Since then his creatinine has been increasing. Presently patient with high BUN and creatinine ratio. His renal function at this moment seems to be improving. Patient is nonoliguric and CT scan of the abdomen didn't show any significant finding with his kidneys. Problem #2 hypernatremia: Most likely from lack of water intake. Presently is on normal saline and his sodium is worsening. Problem #3 low CO2 Problem #4 possible UTI. Presently patient is a febrile, his white blood cell count however is high. Culture is pending. Problem #5 history of hypertension: Patient was hypotensive when he came however presently his blood pressure has improved Problem #6 history of dementia Problem #7 history of diabetes Plan: 1]We'll change his IV fluid to D5 half-normal saline at 135 mL per hour 2] we'll check urine sodium, with Osmolality today 3] we'll check his basic metabolic panel in the morning.  Cody [name] 01/19/2016, 7:56 AM

## 2016-01-19 NOTE — Progress Notes (Signed)
[facility] PROGRESS NOTE  Cody BULNES ZOX:[id number] DOB: 26-Nov-1932 DOA: 01/18/2016 PCP: Laqueta Linden, MD  Assessment/Plan: 1. Sepsis, secondary to UTI, colitis. WBC and lactic acid elevated on admission. Lactic acid now improved. UA suggestive of infection. CT abd findings reflect stercoral colitis vs mild segmental colitis. GI panel in process. C Diff negative. Continue abx. Blood and Urine Cx unremarkable thus far, continue to follow. Continue abx. 2. AKI. Likely prerenal azotemia in setting of severe sepsis with hypotension. BUN 138, Cr 4.6. Improving with aggressive hydration. Continue to monitor. 3. HTN. BP has been low, requiring 4L NS in ED. Will resume home meds when stable. 4. DM type 2. Blood sugar wnl. Continue Levemir and Humalog. Carb modified diet. 5. Hypernatremia. Likely due to dehydration. Trending up, now 154. IV fluids changed to hypotonic fluids. 6. Chronic combined systolic/diastolic CHF. Past TTE showed EF 40-45%. Lasix currently being held due to renal dysfunction. Continue to monitor volume status closely. 7. Elevated troponin. Likely demand ischemia. 0.09 on admission, though pt denied any symptoms. EKG showed v-paced rhythm. CXR showed no active disease. Continue to trend troponin and monitor on telemetry.   Code Status: Full DVT prophylaxis: Heparin Family Communication: Discussed with patient  Disposition Plan: discharge once improved   Consultants:  Nephrology  Procedures:  none  Antibiotics:  Vanc 4/6 >>  Cefepime 4/6 >>  HPI/Subjective: Feels pretty good today. Breathing is good. Not feeling sick. Chest does not hurt. Abd feels fine.  Objective: Filed Vitals:   01/19/16 0400 01/19/16 0500  BP: 83/59   Pulse: 82 79  Temp: 97.8 F (36.6 C)   Resp: 19     Intake/Output Summary (Last 24 hours) at 01/19/16 0536 Last data filed at 01/19/16 0500  Gross per 24 hour  Intake      0 ml  Output    750 ml  Net   -750 ml    Filed Weights   01/18/16 1133 01/18/16 1328 01/19/16 0500  Weight: 67.132 kg (148 lb) 67.132 kg (148 lb) 63.6 kg (140 lb 3.4 oz)    Exam:  General: NAD, looks comfortable Cardiovascular: RRR, S1, S2  Respiratory: clear bilaterally, No wheezing, rales or rhonchi Abdomen: soft, non tender, no distention , bowel sounds normal Musculoskeletal: No edema b/l   Data Reviewed: Basic Metabolic Panel:  Recent Labs Lab 01/18/16 1140 01/18/16 2252  NA 152* 149*  K 4.2 4.1  CL 107 118*  CO2 21* 17*  GLUCOSE 103* 157*  BUN 168* 144*  CREATININE 6.95* 5.13*  CALCIUM 9.5 7.3*   Liver Function Tests:  Recent Labs Lab 01/18/16 1140  AST 44*  ALT 40  ALKPHOS 90  BILITOT 0.5  PROT 7.5  ALBUMIN 3.6    Recent Labs Lab 01/18/16 1140  LIPASE 56*   No results for input(s): AMMONIA in the last 168 hours. CBC:  Recent Labs Lab 01/18/16 1140  WBC 15.9*  HGB 14.6  HCT 44.9  MCV 87.4  PLT 164   Cardiac Enzymes:  Recent Labs Lab 01/18/16 1140  TROPONINI 0.09*   BNP (last 3 results)  Recent Labs  12/07/15 2144 12/31/15 1245  BNP 1063.0* 73.0    ProBNP (last 3 results) No results for input(s): PROBNP in the last 8760 hours.  CBG:  Recent Labs Lab 01/18/16 1743 01/18/16 2138  GLUCAP 81 128*    Recent Results (from the past 240 hour(s))  MRSA PCR Screening     Status: Abnormal   Collection Time: 01/18/16  5:20 PM  Result Value Ref Range Status   MRSA by PCR POSITIVE (A) NEGATIVE Final    Comment:        The GeneXpert MRSA Assay (FDA approved for NASAL specimens only), is one component of a comprehensive MRSA colonization surveillance program. It is not intended to diagnose MRSA infection nor to guide or monitor treatment for MRSA infections. RESULT CALLED TO, READ BACK BY AND VERIFIED WITH: WAGONER,R. RN AT 1717 ON 01/18/2016 BY BAUGHAM,M.   C difficile quick scan w PCR reflex     Status: None   Collection Time: 01/18/16  9:10 PM  Result  Value Ref Range Status   C Diff antigen NEGATIVE NEGATIVE Final   C Diff toxin NEGATIVE NEGATIVE Final   C Diff interpretation Negative for toxigenic C. difficile  Final     Studies: Ct Abdomen Pelvis Wo Contrast  01/18/2016  CLINICAL DATA:  Nausea, vomiting and diarrhea with generalized abdominal pain EXAM: CT ABDOMEN AND PELVIS WITHOUT CONTRAST TECHNIQUE: Multidetector CT imaging of the abdomen and pelvis was performed following the standard protocol without IV contrast. COMPARISON:  None. FINDINGS: Lower chest: No pleural effusion identified. Dependent changes are in the lung bases. Hepatobiliary: No suspicious liver abnormalities identified. The gallbladder appears normal. There is no biliary dilatation. Pancreas: Normal appearance of the pancreas Spleen: Normal in size. Adrenals/Urinary Tract: Fat attenuation lesion within the left adrenal gland measures 3.7 cm, image 28 of series 2. This is new from previous exam. Normal appearance of the right adrenal gland. The kidneys are unremarkable. Mild diffuse circumferential wall thickening involving the urinary bladder. Stomach/Bowel: The stomach appears normal. The small bowel loops have a normal course and caliber. No bowel obstruction. The appendix is visualized and appears normal. The scratch set there is wall thickening involving the sigmoid colon and rectum. There is a marked amount of desiccated stool identified within the rectum, image 79 of series 2. Vascular/Lymphatic: Calcified atherosclerotic disease involves the abdominal aorta. No aneurysm. No enlarged retroperitoneal or mesenteric adenopathy. No enlarged pelvic or inguinal lymph nodes. Reproductive: Prostate gland is not well visualized. Other: There is no ascites or focal fluid collections within the abdomen or pelvis. Musculoskeletal: Degenerative disc disease is noted throughout the lumbar spine. No fractures identified. IMPRESSION: 1. There is wall thickening involving the sigmoid colon and  rectum. Additionally, there is a marked amount of desiccated stool within the rectum. Findings may reflect either stercoral colitis versus mild segmental colitis. No complications identified. 2. Aortic atherosclerosis. 3. Fat attenuation lesion associated with the left adrenal gland likely reflects a benign process such is a myelolipoma. Electronically Signed   By: Signa Kell M.D.   On: 01/18/2016 14:16   Dg Chest 2 View  01/18/2016  CLINICAL DATA:  Nausea, vomiting, diarrhea. EXAM: CHEST  2 VIEW COMPARISON:  December 31, 2015. FINDINGS: The heart size and mediastinal contours are within normal limits. Both lungs are clear. The visualized skeletal structures are unremarkable. IMPRESSION: No active cardiopulmonary disease. Electronically Signed   By: Lupita Raider, M.D.   On: 01/18/2016 14:30    Scheduled Meds: . aspirin  243 mg Oral Once  . aspirin EC  81 mg Oral Daily  . ceFEPime (MAXIPIME) IV  500 mg Intravenous Q24H  . donepezil  5 mg Oral QHS  . escitalopram  10 mg Oral Daily  . fluticasone  1 spray Each Nare Daily  . haloperidol  1 mg Oral QHS  . heparin  5,000 Units Subcutaneous 3 times  per day  . insulin aspart  0-15 Units Subcutaneous TID WC  . insulin aspart  4 Units Subcutaneous TID WC  . insulin detemir  30 Units Subcutaneous QHS  . sodium chloride flush  3 mL Intravenous Q12H  . tamsulosin  0.4 mg Oral Daily  . thiamine  100 mg Oral Daily  . [START ON 01/20/2016] vancomycin  1,000 mg Intravenous Q48H   Continuous Infusions:   Principal Problem:   Severe sepsis (HCC) Active Problems:   DM type 2 causing vascular disease (HCC)   Essential hypertension, benign   ARF (acute renal failure) (HCC)   UTI (lower urinary tract infection)   Colitis   Dehydration   Lactic acidosis   Hypernatremia   Elevated troponin   Chronic combined systolic and diastolic CHF (congestive heart failure) (HCC)   Acute renal failure (HCC)    Time spent: 25 minutes    Erick BlinksJehanzeb Camran Keady,  MD.  [facility] Pager [phone]. If 7PM-7AM, please contact night-coverage at [email or web address], password [email or web address] 01/19/2016, 5:36 AM  LOS: 1 day     By signing my name below, I, Adron BeneGreylon Gawaluck, attest that this documentation has been prepared under the direction and in the presence of Erick BlinksJehanzeb Bascom Biel, MD. Electronically Signed: Adron BeneGreylon Gawaluck 01/19/2016 9:27 am  I, Dr. Erick BlinksJehanzeb Erubiel Manasco, personally performed the services described in this documentaiton. All medical record entries made by the scribe were at my direction and in my presence. I have reviewed the chart and agree that the record reflects my personal performance and is accurate and complete  Erick BlinksJehanzeb Juwon Scripter, MD, 01/19/2016 10:08 AM

## 2016-01-19 NOTE — Care Management Important Message (Signed)
Important Message  Patient Details  Name: Cody [name] MRN: [id number] Date of Birth: [birth year]   Medicare Important Message Given:  Yes    [name], Treniyah Lynn Demske, RN 01/19/2016, 8:48 AM

## 2016-01-19 NOTE — Care Management Note (Addendum)
Case Management Note  Patient Details  Name: Cody [name] MRN: [id number] Date of Birth: [birth year]  Subjective/Objective:                  [facility] Pt admitted with CHF. [facility]  [facility] and [facility]. Anticipate pt will need HH services at [facility]. Alroy BailiffLinda Lothian, of [facility], made aware of referral and will obtain pt info from chart.    Action/Plan: Pt will need HH RN/PT and face to face at [facility]. CSW is following and will make arrangements for return to facility when appropriate.   Expected Discharge Date:    01/21/2016               Expected Discharge Plan:  Assisted Living / Rest Home  In-House Referral:  Clinical Social Work  Discharge planning Services  CM Consult  Post Acute Care Choice:  Home Health Choice offered to:  Patient  DME Arranged:    DME Agency:     HH Arranged:  RN, PT HH Agency:  [facility]  Status of Service:  In process, will continue to follow  Medicare Important Message Given:  Yes Date Medicare IM Given:    Medicare IM give by:    Date Additional Medicare IM Given:    Additional Medicare Important Message give by:     If discussed at Long Length of Stay Meetings, dates discussed:    Additional Comments:  [name], Savilla Turbyfill Demske, RN 01/19/2016, 9:54 AM

## 2016-01-20 LAB — GLUCOSE, CAPILLARY
GLUCOSE-CAPILLARY: 36 mg/dL — AB (ref 65–99)
GLUCOSE-CAPILLARY: 105 mg/dL — AB (ref 65–99)
Glucose-Capillary: 134 mg/dL — ABNORMAL HIGH (ref 65–99)
Glucose-Capillary: 61 mg/dL — ABNORMAL LOW (ref 65–99)
Glucose-Capillary: 195 mg/dL — ABNORMAL HIGH (ref 65–99)
Glucose-Capillary: 110 mg/dL — ABNORMAL HIGH (ref 65–99)

## 2016-01-20 LAB — RENAL FUNCTION PANEL
ANION GAP: 7 (ref 5–15)
Albumin: 2.5 g/dL — ABNORMAL LOW (ref 3.5–5.0)
BUN: 103 mg/dL — ABNORMAL HIGH (ref 6–20)
CALCIUM: 7.7 mg/dL — AB (ref 8.9–10.3)
CO2: 20 mmol/L — ABNORMAL LOW (ref 22–32)
CREATININE: 2.53 mg/dL — AB (ref 0.61–1.24)
Chloride: 128 mmol/L — ABNORMAL HIGH (ref 101–111)
GFR, EST AFRICAN AMERICAN: 26 mL/min — AB (ref >60)
GFR, EST NON AFRICAN AMERICAN: 22 mL/min — AB (ref >60)
Glucose, Bld: 142 mg/dL — ABNORMAL HIGH (ref 65–99)
Phosphorus: 3.5 mg/dL (ref 2.5–4.6)
Potassium: 3.3 mmol/L — ABNORMAL LOW (ref 3.5–5.1)
SODIUM: 155 mmol/L — AB (ref 135–145)

## 2016-01-20 MED ORDER — POTASSIUM CHLORIDE 2 MEQ/ML IV SOLN
INTRAVENOUS | Status: DC
  Administered 2016-01-20 – 2016-01-21 (×4): via INTRAVENOUS
  Filled 2016-01-20 (×10): qty 1000

## 2016-01-20 MED ORDER — POTASSIUM CHLORIDE IN NACL 40-0.9 MEQ/L-% IV SOLN: INTRAVENOUS | Status: DC

## 2016-01-20 MED ORDER — SODIUM CHLORIDE 0.9 % IV SOLN: INTRAVENOUS | Status: DC

## 2016-01-20 MED ORDER — DEXTROSE 50 % IV SOLN
INTRAVENOUS | Status: AC
  Administered 2016-01-20: 50 mL
  Filled 2016-01-20: qty 50

## 2016-01-20 MED ORDER — INSULIN DETEMIR 100 UNIT/ML ~~LOC~~ SOLN
10.0000 [IU] | Freq: Every day | SUBCUTANEOUS | Status: DC
  Administered 2016-01-20: 10 [IU] via SUBCUTANEOUS
  Filled 2016-01-20 (×2): qty 0.1

## 2016-01-20 NOTE — Progress Notes (Signed)
[facility] PROGRESS NOTE  Cody [name] UJW:[id number] DOB: 1933-08-19 DOA: 01/18/2016 PCP: Laqueta LindenKONESWARAN, SURESH A, MD  Assessment/Plan: 1. Hypotension, secondary to dehydration. WBC and lactic acid elevated on admission. WBC improving. Lactic acid now improved. UA suggestive of infection. CT abd findings reflect stercoral colitis vs mild segmental colitis. GI panel negative. C Diff negative. Blood and Urine Cx unremarkable thus far, continue to follow. Since there is no clear source of infection, will d/c abx. Continue abx. 2. AKI. Likely prerenal azotemia in setting of severe sepsis with hypotension. BUN 103, Cr 2.53. Improving with aggressive hydration. Continue to monitor. 3. HTN. BP has been low, requiring 4L NS in ED. Will resume home meds when stable. 4. DM type 2. Blood sugar wnl. Continue Levemir and Humalog. Carb modified diet. 5. Hypernatremia. Likely due to dehydration and aggressive saline infusion. Trending up, now 155. IV fluids changed to hypotonic fluids. 6. Chronic combined systolic/diastolic CHF. Past TTE showed EF 40-45%. Lasix currently being held due to renal dysfunction. Continue to monitor volume status closely. 7. Elevated troponin. Likely demand ischemia. 0.09 on admission, though pt denied any symptoms. EKG showed v-paced rhythm. CXR showed no active disease.   Code Status: Full DVT prophylaxis: Heparin Family Communication: Discussed with patient  Disposition Plan: discharge once improved   Consultants:  Nephrology  Procedures:  none  Antibiotics:  Vanc 4/6 >> 4/8  Cefepime 4/6 >>4/8  HPI/Subjective: Feels alright today.  Objective: Filed Vitals:   01/20/16 0300 01/20/16 0400  BP: 138/42 134/36  Pulse: 67 60  Temp:  98.9 F (37.2 C)  Resp: 20 13    Intake/Output Summary (Last 24 hours) at 01/20/16 0557 Last data filed at 01/20/16 0511  Gross per 24 hour  Intake 1387.5 ml  Output   3175 ml  Net -1787.5 ml   Filed Weights    01/18/16 1328 01/19/16 0500 01/20/16 0400  Weight: 67.132 kg (148 lb) 63.6 kg (140 lb 3.4 oz) 64.2 kg (141 lb 8.6 oz)    Exam:  General: NAD, looks comfortable Cardiovascular: RRR, S1, S2  Respiratory: clear bilaterally, No wheezing, rales or rhonchi Abdomen: soft, non tender, no distention , bowel sounds normal Musculoskeletal: No edema b/l   Data Reviewed: Basic Metabolic Panel:  Recent Labs Lab 01/18/16 1140 01/18/16 2252 01/19/16 0414  NA 152* 149* 154*  K 4.2 4.1 4.1  CL 107 118* 122*  CO2 21* 17* 16*  GLUCOSE 103* 157* 162*  BUN 168* 144* 138*  CREATININE 6.95* 5.13* 4.60*  CALCIUM 9.5 7.3* 7.8*   Liver Function Tests:  Recent Labs Lab 01/18/16 1140  AST 44*  ALT 40  ALKPHOS 90  BILITOT 0.5  PROT 7.5  ALBUMIN 3.6    Recent Labs Lab 01/18/16 1140  LIPASE 56*   No results for input(s): AMMONIA in the last 168 hours. CBC:  Recent Labs Lab 01/18/16 1140 01/19/16 0414  WBC 15.9* 12.0*  NEUTROABS  --  8.6*  HGB 14.6 12.0*  HCT 44.9 37.9*  MCV 87.4 89.2  PLT 164 144*   Cardiac Enzymes:  Recent Labs Lab 01/18/16 1140  TROPONINI 0.09*   BNP (last 3 results)  Recent Labs  12/07/15 2144 12/31/15 1245  BNP 1063.0* 73.0    ProBNP (last 3 results) No results for input(s): PROBNP in the last 8760 hours.  CBG:  Recent Labs Lab 01/18/16 2138 01/19/16 0731 01/19/16 1134 01/19/16 1613 01/19/16 2233  GLUCAP 128* 123* 128* 149* 123*    Recent Results (from the  past 240 hour(s))  Urine culture     Status: None   Collection Time: 01/18/16  1:15 PM  Result Value Ref Range Status   Specimen Description URINE, CATHETERIZED  Final   Special Requests NONE  Final   Culture   Final    NO GROWTH 1 DAY Performed at [facility]    Report Status 01/19/2016 FINAL  Final  Blood Culture (routine x 2)     Status: None (Preliminary result)   Collection Time: 01/18/16  1:45 PM  Result Value Ref Range Status   Specimen Description BLOOD  LEFT HAND  Final   Special Requests BOTTLES DRAWN AEROBIC AND ANAEROBIC 5CC EACH  Final   Culture NO GROWTH < 24 HOURS  Final   Report Status PENDING  Incomplete  Blood Culture (routine x 2)     Status: None (Preliminary result)   Collection Time: 01/18/16  1:50 PM  Result Value Ref Range Status   Specimen Description BLOOD RIGHT HAND  Final   Special Requests BOTTLES DRAWN AEROBIC ONLY 4CC  Final   Culture NO GROWTH < 24 HOURS  Final   Report Status PENDING  Incomplete  Gastrointestinal Panel by PCR , Stool     Status: None   Collection Time: 01/18/16  3:25 PM  Result Value Ref Range Status   Campylobacter species NOT DETECTED NOT DETECTED Final   Plesimonas shigelloides NOT DETECTED NOT DETECTED Final   Salmonella species NOT DETECTED NOT DETECTED Final   Yersinia enterocolitica NOT DETECTED NOT DETECTED Final   Vibrio species NOT DETECTED NOT DETECTED Final   Vibrio cholerae NOT DETECTED NOT DETECTED Final   Enteroaggregative E coli (EAEC) NOT DETECTED NOT DETECTED Final   Enteropathogenic E coli (EPEC) NOT DETECTED NOT DETECTED Final   Enterotoxigenic E coli (ETEC) NOT DETECTED NOT DETECTED Final   Shiga like toxin producing E coli (STEC) NOT DETECTED NOT DETECTED Final   E. coli O157 NOT DETECTED NOT DETECTED Final   Shigella/Enteroinvasive E coli (EIEC) NOT DETECTED NOT DETECTED Final   Cryptosporidium NOT DETECTED NOT DETECTED Final   Cyclospora cayetanensis NOT DETECTED NOT DETECTED Final   Entamoeba histolytica NOT DETECTED NOT DETECTED Final   Giardia lamblia NOT DETECTED NOT DETECTED Final   Adenovirus F40/41 NOT DETECTED NOT DETECTED Final   Astrovirus NOT DETECTED NOT DETECTED Final   Norovirus GI/GII NOT DETECTED NOT DETECTED Final   Rotavirus A NOT DETECTED NOT DETECTED Final   Sapovirus (I, II, IV, and V) NOT DETECTED NOT DETECTED Final  MRSA PCR Screening     Status: Abnormal   Collection Time: 01/18/16  5:20 PM  Result Value Ref Range Status   MRSA by PCR  POSITIVE (A) NEGATIVE Final    Comment:        The GeneXpert MRSA Assay (FDA approved for NASAL specimens only), is one component of a comprehensive MRSA colonization surveillance program. It is not intended to diagnose MRSA infection nor to guide or monitor treatment for MRSA infections. RESULT CALLED TO, READ BACK BY AND VERIFIED WITH: WAGONER,R. RN AT 1717 ON 01/18/2016 BY BAUGHAM,M.   C difficile quick scan w PCR reflex     Status: None   Collection Time: 01/18/16  9:10 PM  Result Value Ref Range Status   C Diff antigen NEGATIVE NEGATIVE Final   C Diff toxin NEGATIVE NEGATIVE Final   C Diff interpretation Negative for toxigenic C. difficile  Final     Studies: Ct Abdomen Pelvis Wo Contrast  01/18/2016  CLINICAL DATA:  Nausea, vomiting and diarrhea with generalized abdominal pain EXAM: CT ABDOMEN AND PELVIS WITHOUT CONTRAST TECHNIQUE: Multidetector CT imaging of the abdomen and pelvis was performed following the standard protocol without IV contrast. COMPARISON:  None. FINDINGS: Lower chest: No pleural effusion identified. Dependent changes are in the lung bases. Hepatobiliary: No suspicious liver abnormalities identified. The gallbladder appears normal. There is no biliary dilatation. Pancreas: Normal appearance of the pancreas Spleen: Normal in size. Adrenals/Urinary Tract: Fat attenuation lesion within the left adrenal gland measures 3.7 cm, image 28 of series 2. This is new from previous exam. Normal appearance of the right adrenal gland. The kidneys are unremarkable. Mild diffuse circumferential wall thickening involving the urinary bladder. Stomach/Bowel: The stomach appears normal. The small bowel loops have a normal course and caliber. No bowel obstruction. The appendix is visualized and appears normal. The scratch set there is wall thickening involving the sigmoid colon and rectum. There is a marked amount of desiccated stool identified within the rectum, image 79 of series 2.  Vascular/Lymphatic: Calcified atherosclerotic disease involves the abdominal aorta. No aneurysm. No enlarged retroperitoneal or mesenteric adenopathy. No enlarged pelvic or inguinal lymph nodes. Reproductive: Prostate gland is not well visualized. Other: There is no ascites or focal fluid collections within the abdomen or pelvis. Musculoskeletal: Degenerative disc disease is noted throughout the lumbar spine. No fractures identified. IMPRESSION: 1. There is wall thickening involving the sigmoid colon and rectum. Additionally, there is a marked amount of desiccated stool within the rectum. Findings may reflect either stercoral colitis versus mild segmental colitis. No complications identified. 2. Aortic atherosclerosis. 3. Fat attenuation lesion associated with the left adrenal gland likely reflects a benign process such is a myelolipoma. Electronically Signed   By: Signa Kell M.D.   On: 01/18/2016 14:16   Dg Chest 2 View  01/18/2016  CLINICAL DATA:  Nausea, vomiting, diarrhea. EXAM: CHEST  2 VIEW COMPARISON:  December 31, 2015. FINDINGS: The heart size and mediastinal contours are within normal limits. Both lungs are clear. The visualized skeletal structures are unremarkable. IMPRESSION: No active cardiopulmonary disease. Electronically Signed   By: Lupita Raider, M.D.   On: 01/18/2016 14:30    Scheduled Meds: . aspirin  243 mg Oral Once  . aspirin EC  81 mg Oral Daily  . ceFEPime (MAXIPIME) IV  500 mg Intravenous Q24H  . Chlorhexidine Gluconate Cloth  6 each Topical Q0600  . donepezil  5 mg Oral QHS  . escitalopram  10 mg Oral Daily  . fluticasone  1 spray Each Nare Daily  . haloperidol  1 mg Oral QHS  . heparin  5,000 Units Subcutaneous 3 times per day  . insulin aspart  0-15 Units Subcutaneous TID WC  . insulin aspart  4 Units Subcutaneous TID WC  . insulin detemir  30 Units Subcutaneous QHS  . mupirocin ointment  1 application Nasal BID  . sodium chloride flush  3 mL Intravenous Q12H  .  tamsulosin  0.4 mg Oral Daily  . thiamine  100 mg Oral Daily  . vancomycin  1,000 mg Intravenous Q48H   Continuous Infusions: . dextrose 5 % and 0.45% NaCl 135 mL/hr at 01/20/16 0400    Principal Problem:   Severe sepsis (HCC) Active Problems:   DM type 2 causing vascular disease (HCC)   Essential hypertension, benign   ARF (acute renal failure) (HCC)   UTI (lower urinary tract infection)   Colitis   Dehydration   Lactic acidosis  Hypernatremia   Elevated troponin   Chronic combined systolic and diastolic CHF (congestive heart failure) (HCC)   Acute renal failure (HCC)    Time spent: 25 minutes    Erick Blinks, MD.  [facility] Pager [phone]. If 7PM-7AM, please contact night-coverage at [email or web address], password [facility] 01/20/2016, 5:57 AM  LOS: 2 days     By signing my name below, I, Adron Bene, attest that this documentation has been prepared under the direction and in the presence of Erick Blinks, MD. Electronically Signed: Adron Bene 01/20/2016 9:41am  I, Dr. Erick Blinks, personally performed the services described in this documentaiton. All medical record entries made by the scribe were at my direction and in my presence. I have reviewed the chart and agree that the record reflects my personal performance and is accurate and complete  Erick Blinks, MD, 01/20/2016 10:23 AM

## 2016-01-20 NOTE — Progress Notes (Signed)
Still not eating well. After given D50 CBG was 195 now only 110 will hold all insulins at this time. [facility] and patient will be transported to 304.

## 2016-01-20 NOTE — Progress Notes (Signed)
Subjective: Interval History: none.  Objective: Vital signs in last 24 hours: Temp:  [97.8 F (36.6 C)-100.3 F (37.9 C)] 98.9 F (37.2 C) (04/08 0812) Pulse Rate:  [59-79] 59 (04/08 0600) Resp:  [13-25] 19 (04/08 0600) BP: (97-150)/(36-80) 139/44 mmHg (04/08 0600) SpO2:  [97 %-100 %] 98 % (04/08 0600) Weight:  [64.2 kg (141 lb 8.6 oz)] 64.2 kg (141 lb 8.6 oz) (04/08 0400) Weight change: -2.932 kg (-6 lb 7.4 oz)  Intake/Output from previous day: 04/07 0701 - 04/08 0700 In: 1387.5 [P.O.:195; I.V.:1192.5] Out: 3175 [Urine:3175] Intake/Output this shift:    General appearance: alert, cooperative and no distress Resp: clear to auscultation bilaterally Cardio: regular rate and rhythm, S1, S2 normal, no murmur, click, rub or gallop GI: soft, non-tender; bowel sounds normal; no masses,  no organomegaly Extremities: extremities normal, atraumatic, no cyanosis or edema  Lab Results:  Recent Labs  01/18/16 1140 01/19/16 0414  WBC 15.9* 12.0*  HGB 14.6 12.0*  HCT 44.9 37.9*  PLT 164 144*   BMET:  Recent Labs  01/18/16 2252 01/19/16 0414  NA 149* 154*  K 4.1 4.1  CL 118* 122*  CO2 17* 16*  GLUCOSE 157* 162*  BUN 144* 138*  CREATININE 5.13* 4.60*  CALCIUM 7.3* 7.8*   No results for input(s): PTH in the last 72 hours. Iron Studies: No results for input(s): IRON, TIBC, TRANSFERRIN, FERRITIN in the last 72 hours.  Studies/Results: Ct Abdomen Pelvis Wo Contrast  01/18/2016  CLINICAL DATA:  Nausea, vomiting and diarrhea with generalized abdominal pain EXAM: CT ABDOMEN AND PELVIS WITHOUT CONTRAST TECHNIQUE: Multidetector CT imaging of the abdomen and pelvis was performed following the standard protocol without IV contrast. COMPARISON:  None. FINDINGS: Lower chest: No pleural effusion identified. Dependent changes are in the lung bases. Hepatobiliary: No suspicious liver abnormalities identified. The gallbladder appears normal. There is no biliary dilatation. Pancreas: Normal  appearance of the pancreas Spleen: Normal in size. Adrenals/Urinary Tract: Fat attenuation lesion within the left adrenal gland measures 3.7 cm, image 28 of series 2. This is new from previous exam. Normal appearance of the right adrenal gland. The kidneys are unremarkable. Mild diffuse circumferential wall thickening involving the urinary bladder. Stomach/Bowel: The stomach appears normal. The small bowel loops have a normal course and caliber. No bowel obstruction. The appendix is visualized and appears normal. The scratch set there is wall thickening involving the sigmoid colon and rectum. There is a marked amount of desiccated stool identified within the rectum, image 79 of series 2. Vascular/Lymphatic: Calcified atherosclerotic disease involves the abdominal aorta. No aneurysm. No enlarged retroperitoneal or mesenteric adenopathy. No enlarged pelvic or inguinal lymph nodes. Reproductive: Prostate gland is not well visualized. Other: There is no ascites or focal fluid collections within the abdomen or pelvis. Musculoskeletal: Degenerative disc disease is noted throughout the lumbar spine. No fractures identified. IMPRESSION: 1. There is wall thickening involving the sigmoid colon and rectum. Additionally, there is a marked amount of desiccated stool within the rectum. Findings may reflect either stercoral colitis versus mild segmental colitis. No complications identified. 2. Aortic atherosclerosis. 3. Fat attenuation lesion associated with the left adrenal gland likely reflects a benign process such is a myelolipoma. Electronically Signed   By: Signa Kell M.D.   On: 01/18/2016 14:16   Dg Chest 2 View  01/18/2016  CLINICAL DATA:  Nausea, vomiting, diarrhea. EXAM: CHEST  2 VIEW COMPARISON:  December 31, 2015. FINDINGS: The heart size and mediastinal contours are within normal limits. Both lungs  are clear. The visualized skeletal structures are unremarkable. IMPRESSION: No active cardiopulmonary disease.  Electronically Signed   By: Lupita RaiderJames  Green Jr, M.D.   On: 01/18/2016 14:30    I have reviewed the patient's current medications.  Assessment/Plan: Problem #1 acute kidney injury: Most likely prerenal/ATN. Presently his renal function seems to be improving. Patient is asymptomatic. Problem #2 hypernatremia: From lack of free water. His sodium is still high. Problem #3 history of diabetes: His blood sugar is reasonably controlled Problem #4 hypertension: Patient came with hypotension presently his blood pressure has improved. Problem #5 history of chronic congestive heart failure: Presently patient doesn't have any sign of fluid overload and he has 3100 mL of urine output the last 24 hours. Problem #6 low CO2 possibly secondary to metabolic acidosis. When he came his lactic acid was high but presently has improved. Problem #7 possible UTI: Presently he is a febrile and on antibiotics. Plan: 1] We'll continue with present management 2] we'll check renal panel today and tomorrow.   LOS: 2 days   Cody [name] S 01/20/2016,8:13 AM

## 2016-01-21 LAB — GLUCOSE, CAPILLARY
GLUCOSE-CAPILLARY: 56 mg/dL — AB (ref 65–99)
GLUCOSE-CAPILLARY: 116 mg/dL — AB (ref 65–99)
Glucose-Capillary: 205 mg/dL — ABNORMAL HIGH (ref 65–99)
Glucose-Capillary: 197 mg/dL — ABNORMAL HIGH (ref 65–99)

## 2016-01-21 LAB — RENAL FUNCTION PANEL
ANION GAP: 5 (ref 5–15)
Albumin: 2.4 g/dL — ABNORMAL LOW (ref 3.5–5.0)
BUN: 70 mg/dL — ABNORMAL HIGH (ref 6–20)
CALCIUM: 7.6 mg/dL — AB (ref 8.9–10.3)
CHLORIDE: 123 mmol/L — AB (ref 101–111)
CO2: 22 mmol/L (ref 22–32)
Creatinine, Ser: 1.68 mg/dL — ABNORMAL HIGH (ref 0.61–1.24)
GFR calc non Af Amer: 37 mL/min — ABNORMAL LOW (ref >60)
GFR, EST AFRICAN AMERICAN: 43 mL/min — AB (ref >60)
GLUCOSE: 212 mg/dL — AB (ref 65–99)
POTASSIUM: 4 mmol/L (ref 3.5–5.1)
Phosphorus: 2.5 mg/dL (ref 2.5–4.6)
SODIUM: 150 mmol/L — AB (ref 135–145)

## 2016-01-21 LAB — CBC
HCT: 35.3 % — ABNORMAL LOW (ref 39.0–52.0)
Hemoglobin: 11.2 g/dL — ABNORMAL LOW (ref 13.0–17.0)
MCH: 28.1 pg (ref 26.0–34.0)
MCHC: 31.7 g/dL (ref 30.0–36.0)
MCV: 88.5 fL (ref 78.0–100.0)
PLATELETS: 112 10*3/uL — AB (ref 150–400)
RBC: 3.99 MIL/uL — ABNORMAL LOW (ref 4.22–5.81)
RDW: 14 % (ref 11.5–15.5)
WBC: 8.1 10*3/uL (ref 4.0–10.5)

## 2016-01-21 MED ORDER — INSULIN DETEMIR 100 UNIT/ML ~~LOC~~ SOLN
20.0000 [IU] | Freq: Every day | SUBCUTANEOUS | Status: DC
  Administered 2016-01-21: 20 [IU] via SUBCUTANEOUS
  Filled 2016-01-21: qty 0.2

## 2016-01-21 NOTE — Progress Notes (Addendum)
Subjective: Interval History: Patient is more alert today. He denies any difficulty breathing. At this moment to force no complaints.  Objective: Vital signs in last 24 hours: Temp:  [97.6 F (36.4 C)-99 F (37.2 C)] 98.5 F (36.9 C) (04/09 0639) Pulse Rate:  [58-70] 70 (04/09 0639) Resp:  [17-21] 20 (04/09 0639) BP: (149-167)/(62-115) 151/62 mmHg (04/09 0639) SpO2:  [98 %-100 %] 98 % (04/09 0639) Weight change:   Intake/Output from previous day: 04/08 0701 - 04/09 0700 In: 2739.8 [P.O.:120; I.V.:2619.8] Out: 2650 [Urine:2650] Intake/Output this shift:    General appearance: alert, cooperative and no distress Resp: clear to auscultation bilaterally Cardio: regular rate and rhythm, S1, S2 normal, no murmur, click, rub or gallop GI: soft, non-tender; bowel sounds normal; no masses,  no organomegaly Extremities: extremities normal, atraumatic, no cyanosis or edema  Lab Results:  Recent Labs  01/19/16 0414 01/21/16 0604  WBC 12.0* 8.1  HGB 12.0* 11.2*  HCT 37.9* 35.3*  PLT 144* 112*   BMET:   Recent Labs  01/20/16 0820 01/21/16 0604  NA 155* 150*  K 3.3* 4.0  CL 128* 123*  CO2 20* 22  GLUCOSE 142* 212*  BUN 103* 70*  CREATININE 2.53* 1.68*  CALCIUM 7.7* 7.6*   No results for input(s): PTH in the last 72 hours. Iron Studies: No results for input(s): IRON, TIBC, TRANSFERRIN, FERRITIN in the last 72 hours.  Studies/Results: No results found.  I have reviewed the patient's current medications.  Assessment/Plan: Problem #1 acute kidney injury: Most likely prerenal/ATN. His BUN and creatinine continuously improving. Patient presently is asymptomatic. Problem #2 hypernatremia: His sodium is 150 and still high. But has improved. Problem #3 history of diabetes: His blood sugar is reasonably controlled Problem #4 hypertension: Patient came with hypotension presently his blood pressure has improved. Problem #5 history of chronic congestive heart failure: Presently  patient denies any difficulty breathing. He has 2 600 mL of urine output and patient does not have any sign and symptom of fluid overload. Problem #6 low CO2 possibly secondary to metabolic acidosis. He CO2 is 22 and progressively improving. Problem #7 possible UTI: Presently he is a febrile and his white blood cell count is normal. Plan: 1] We'll continue with present management 2] we'll check renal panel today and tomorrow.   LOS: 3 days   Raelea Gosse S 01/21/2016,8:52 AM

## 2016-01-21 NOTE — Progress Notes (Signed)
[facility] PROGRESS NOTE  Montel CulverRussell E Nordmann JYN:[id number] DOB: [birth year] DOA: 01/18/2016 PCP: Laqueta LindenKONESWARAN, SURESH A, MD  Assessment/Plan: 1. Hypotension, secondary to dehydration. Resolved. WBC and lactic acid elevated on admission however now wnl. CT abd findings reflect stercoral colitis vs mild segmental colitis. GI panel negative. C Diff negative. He is not having any GI symptoms. Blood and Urine Cx unremarkable thus far, continue to follow. Since there is no clear source of infection, abx have been discontinued. 2. AKI. Likely prerenal azotemia in setting of severe sepsis with hypotension.  Improving with aggressive hydration. BUN 70, Cr 1.68. Continue to monitor. 3. HTN. BP has been low, requiring 4L NS in ED. Will resume home meds when stable. 4. DM type 2. Blood sugar are trending up. Will increase Levemir to 20U and continue Humalog. Carb modified diet. 5. Hypernatremia. Likely due to dehydration and aggressive saline infusion. Trending down, now 150. Continue hypotonic fluids. 6. Chronic combined systolic/diastolic CHF. Past TTE showed EF 40-45%. Lasix currently being held due to renal dysfunction. Continue to monitor volume status closely. 7. Elevated troponin. Likely demand ischemia. 0.09 on admission, though pt denied any symptoms. EKG showed v-paced rhythm. CXR showed no active disease.   Code Status: Full DVT prophylaxis: Heparin Family Communication: Discussed with patient  Disposition Plan: discharge once improved   Consultants:  Nephrology  Procedures:  none  Antibiotics:  Vancomycin 4/6 >> 4/8  Cefepime 4/6 >>4/8  HPI/Subjective: Feeling all right. Ate breakfast this morning. Denies any pain  Objective: Filed Vitals:   01/20/16 2047 01/21/16 0639  BP: 149/69 151/62  Pulse: 69 70  Temp: 99 F (37.2 C) 98.5 F (36.9 C)  Resp: 20 20    Intake/Output Summary (Last 24 hours) at 01/21/16 0758 Last data filed at 01/21/16 0641  Gross per 24 hour   Intake  982.5 ml  Output   2650 ml  Net -1667.5 ml   Filed Weights   01/18/16 1328 01/19/16 0500 01/20/16 0400  Weight: 67.132 kg (148 lb) 63.6 kg (140 lb 3.4 oz) 64.2 kg (141 lb 8.6 oz)    Exam:  General: NAD, looks comfortable Cardiovascular: RRR, S1, S2  Respiratory: clear bilaterally, No wheezing, rales or rhonchi Abdomen: soft, non tender, no distention , bowel sounds normal Musculoskeletal: No edema b/l   Data Reviewed: Basic Metabolic Panel:  Recent Labs Lab 01/18/16 1140 01/18/16 2252 01/19/16 0414 01/20/16 0820 01/21/16 0604  NA 152* 149* 154* 155* 150*  K 4.2 4.1 4.1 3.3* 4.0  CL 107 118* 122* 128* 123*  CO2 21* 17* 16* 20* 22  GLUCOSE 103* 157* 162* 142* 212*  BUN 168* 144* 138* 103* 70*  CREATININE 6.95* 5.13* 4.60* 2.53* 1.68*  CALCIUM 9.5 7.3* 7.8* 7.7* 7.6*  PHOS  --   --   --  3.5 2.5   Liver Function Tests:  Recent Labs Lab 01/18/16 1140 01/20/16 0820 01/21/16 0604  AST 44*  --   --   ALT 40  --   --   ALKPHOS 90  --   --   BILITOT 0.5  --   --   PROT 7.5  --   --   ALBUMIN 3.6 2.5* 2.4*    Recent Labs Lab 01/18/16 1140  LIPASE 56*   CBC:  Recent Labs Lab 01/18/16 1140 01/19/16 0414 01/21/16 0604  WBC 15.9* 12.0* 8.1  NEUTROABS  --  8.6*  --   HGB 14.6 12.0* 11.2*  HCT 44.9 37.9* 35.3*  MCV 87.4 89.2  88.5  PLT 164 144* 112*   Cardiac Enzymes:  Recent Labs Lab 01/18/16 1140  TROPONINI 0.09*   BNP (last 3 results)  Recent Labs  12/07/15 2144 12/31/15 1245  BNP 1063.0* 73.0   CBG:  Recent Labs Lab 01/20/16 1216 01/20/16 1336 01/20/16 1538 01/20/16 2101 01/21/16 0733  GLUCAP 61* 195* 110* 105* 205*    Recent Results (from the past 240 hour(s))  Urine culture     Status: None   Collection Time: 01/18/16  1:15 PM  Result Value Ref Range Status   Specimen Description URINE, CATHETERIZED  Final   Special Requests NONE  Final   Culture   Final    NO GROWTH 1 DAY Performed at [facility]     Report Status 01/19/2016 FINAL  Final  Blood Culture (routine x 2)     Status: None (Preliminary result)   Collection Time: 01/18/16  1:45 PM  Result Value Ref Range Status   Specimen Description BLOOD LEFT HAND  Final   Special Requests BOTTLES DRAWN AEROBIC AND ANAEROBIC 5CC EACH  Final   Culture NO GROWTH 2 DAYS  Final   Report Status PENDING  Incomplete  Blood Culture (routine x 2)     Status: None (Preliminary result)   Collection Time: 01/18/16  1:50 PM  Result Value Ref Range Status   Specimen Description BLOOD RIGHT HAND  Final   Special Requests BOTTLES DRAWN AEROBIC ONLY 4CC  Final   Culture NO GROWTH 2 DAYS  Final   Report Status PENDING  Incomplete  Gastrointestinal Panel by PCR , Stool     Status: None   Collection Time: 01/18/16  3:25 PM  Result Value Ref Range Status   Campylobacter species NOT DETECTED NOT DETECTED Final   Plesimonas shigelloides NOT DETECTED NOT DETECTED Final   Salmonella species NOT DETECTED NOT DETECTED Final   Yersinia enterocolitica NOT DETECTED NOT DETECTED Final   Vibrio species NOT DETECTED NOT DETECTED Final   Vibrio cholerae NOT DETECTED NOT DETECTED Final   Enteroaggregative E coli (EAEC) NOT DETECTED NOT DETECTED Final   Enteropathogenic E coli (EPEC) NOT DETECTED NOT DETECTED Final   Enterotoxigenic E coli (ETEC) NOT DETECTED NOT DETECTED Final   Shiga like toxin producing E coli (STEC) NOT DETECTED NOT DETECTED Final   E. coli O157 NOT DETECTED NOT DETECTED Final   Shigella/Enteroinvasive E coli (EIEC) NOT DETECTED NOT DETECTED Final   Cryptosporidium NOT DETECTED NOT DETECTED Final   Cyclospora cayetanensis NOT DETECTED NOT DETECTED Final   Entamoeba histolytica NOT DETECTED NOT DETECTED Final   Giardia lamblia NOT DETECTED NOT DETECTED Final   Adenovirus F40/41 NOT DETECTED NOT DETECTED Final   Astrovirus NOT DETECTED NOT DETECTED Final   Norovirus GI/GII NOT DETECTED NOT DETECTED Final   Rotavirus A NOT DETECTED NOT DETECTED  Final   Sapovirus (I, II, IV, and V) NOT DETECTED NOT DETECTED Final  MRSA PCR Screening     Status: Abnormal   Collection Time: 01/18/16  5:20 PM  Result Value Ref Range Status   MRSA by PCR POSITIVE (A) NEGATIVE Final    Comment:        The GeneXpert MRSA Assay (FDA approved for NASAL specimens only), is one component of a comprehensive MRSA colonization surveillance program. It is not intended to diagnose MRSA infection nor to guide or monitor treatment for MRSA infections. RESULT CALLED TO, READ BACK BY AND VERIFIED WITH: WAGONER,R. RN AT 1717 ON 01/18/2016 BY BAUGHAM,M.  C difficile quick scan w PCR reflex     Status: None   Collection Time: 01/18/16  9:10 PM  Result Value Ref Range Status   C Diff antigen NEGATIVE NEGATIVE Final   C Diff toxin NEGATIVE NEGATIVE Final   C Diff interpretation Negative for toxigenic C. difficile  Final     Scheduled Meds: . aspirin  243 mg Oral Once  . aspirin EC  81 mg Oral Daily  . Chlorhexidine Gluconate Cloth  6 each Topical Q0600  . donepezil  5 mg Oral QHS  . escitalopram  10 mg Oral Daily  . fluticasone  1 spray Each Nare Daily  . haloperidol  1 mg Oral QHS  . heparin  5,000 Units Subcutaneous 3 times per day  . insulin aspart  0-15 Units Subcutaneous TID WC  . insulin aspart  4 Units Subcutaneous TID WC  . insulin detemir  10 Units Subcutaneous QHS  . mupirocin ointment  1 application Nasal BID  . sodium chloride flush  3 mL Intravenous Q12H  . tamsulosin  0.4 mg Oral Daily  . thiamine  100 mg Oral Daily   Continuous Infusions: . dextrose 5 % 1,000 mL with potassium chloride 40 mEq infusion 125 mL/hr at 01/21/16 0433    Active Problems:   DM type 2 causing vascular disease (HCC)   Essential hypertension, benign   ARF (acute renal failure) (HCC)   Dehydration   Lactic acidosis   Hypernatremia   Elevated troponin   Chronic combined systolic and diastolic CHF (congestive heart failure) (HCC)   Acute renal failure  (HCC)    Time spent: 25 minutes    Erick Blinks, MD.  [facility] Pager [phone]. If 7PM-7AM, please contact night-coverage at [email or web address], password [facility] 01/21/2016, 7:58 AM  LOS: 3 days    By signing my name below, I, Burnett Harry, attest that this documentation has been prepared under the direction and in the presence of [facility]. MD Electronically Signed: Burnett Harry, Scribe. 01/21/2016   I, Dr. Erick Blinks, personally performed the services described in this documentaiton. All medical record entries made by the scribe were at my direction and in my presence. I have reviewed the chart and agree that the record reflects my personal performance and is accurate and complete  Erick Blinks, MD, 01/21/2016 10:34 AM

## 2016-01-22 LAB — RENAL FUNCTION PANEL
Albumin: 2.5 g/dL — ABNORMAL LOW (ref 3.5–5.0)
Anion gap: 8 (ref 5–15)
BUN: 48 mg/dL — ABNORMAL HIGH (ref 6–20)
CHLORIDE: 121 mmol/L — AB (ref 101–111)
CO2: 22 mmol/L (ref 22–32)
CREATININE: 1.22 mg/dL (ref 0.61–1.24)
Calcium: 7.9 mg/dL — ABNORMAL LOW (ref 8.9–10.3)
GFR calc non Af Amer: 54 mL/min — ABNORMAL LOW (ref >60)
Glucose, Bld: 157 mg/dL — ABNORMAL HIGH (ref 65–99)
Phosphorus: 2.1 mg/dL — ABNORMAL LOW (ref 2.5–4.6)
Potassium: 4.2 mmol/L (ref 3.5–5.1)
Sodium: 151 mmol/L — ABNORMAL HIGH (ref 135–145)

## 2016-01-22 LAB — GLUCOSE, CAPILLARY
Glucose-Capillary: 151 mg/dL — ABNORMAL HIGH (ref 65–99)
Glucose-Capillary: 140 mg/dL — ABNORMAL HIGH (ref 65–99)
Glucose-Capillary: 203 mg/dL — ABNORMAL HIGH (ref 65–99)
Glucose-Capillary: 135 mg/dL — ABNORMAL HIGH (ref 65–99)
Glucose-Capillary: 174 mg/dL — ABNORMAL HIGH (ref 65–99)

## 2016-01-22 MED ORDER — DEXTROSE 5 % IV SOLN
INTRAVENOUS | Status: DC
  Administered 2016-01-22 – 2016-01-24 (×4): via INTRAVENOUS

## 2016-01-22 MED ORDER — METOPROLOL SUCCINATE ER 25 MG PO TB24
25.0000 mg | ORAL_TABLET | Freq: Every day | ORAL | Status: DC
  Administered 2016-01-22 – 2016-01-24 (×3): 25 mg via ORAL
  Filled 2016-01-22 (×3): qty 1

## 2016-01-22 NOTE — Clinical Social Work Note (Signed)
Clinical Social Work Assessment  Patient Details  Name: Cody [name] E Augustine MRN: [id number] Date of Birth: Nov 29, 1932  Date of referral:  01/22/16               Reason for consult:  Other (Comment Required) (From [facility])                Permission sought to share information with:    Permission granted to share information::     Name::        Agency::     Relationship::     Contact Information:     Housing/Transportation Living arrangements for the past 2 months:  Assisted Living Facility Source of Information:  Facility Patient Interpreter Needed:  None Criminal Activity/Legal Involvement Pertinent to Current Situation/Hospitalization:  No - Comment as needed Significant Relationships:  None Lives with:  Facility Resident Do you feel safe going back to the place where you live?  Yes Need for family participation in patient care:  No (Coment)  Care giving concerns:  None identified.    Social Worker assessment / plan:  CSW [facility] who identified that patient's appears to have changed since he had a pacemaker placed recently.  She stated that patient is typically in a wheelchair, however when he does get up he uses a walker.  Kim indicated that patient's eating habits have changed since he received the pacemaker. She stated that patient was schedule to have a GI appointment on Friday, however he was brought to the hospital due to his current status. She stated that patient's only family are nieces who live out of town.  Kim advised that patient can return and [facility].    Employment status:  Retired Database administratornsurance information:  Managed Medicare PT Recommendations:  Not assessed at this time Information / Referral to community resources:   (Patient already has HH in place at ALF)  Patient/Family's Response to care: N/A  Patient/Family's Understanding of and Emotional Response to Diagnosis, Current Treatment, and  Prognosis:  Patient is oriented only to self.   Emotional Assessment Appearance:  Developmentally appropriate Attitude/Demeanor/Rapport:  Unable to Assess Affect (typically observed):  Unable to Assess Orientation:  Oriented to Self Alcohol / Substance use:  Not Applicable Psych involvement (Current and /or in the community):  No (Comment)  Discharge Needs  Concerns to be addressed:  No discharge needs identified Readmission within the last 30 days:  No Current discharge risk:  None Barriers to Discharge:  No Barriers Identified   Annice NeedySettle, Chinwe Lope D, LCSW 01/22/2016, 12:39 PM

## 2016-01-22 NOTE — Progress Notes (Signed)
[facility] PROGRESS NOTE  Cody [name] ZOX:[id number] DOB: 1933-01-29 DOA: 01/18/2016 PCP: Laqueta Linden, MD  Assessment/Plan: 1. Hypotension, secondary to dehydration. Resolved. WBC and lactic acid elevated on admission however now wnl. CT abd findings reflect stercoral colitis vs mild segmental colitis. GI panel negative. C Diff negative. He is not having any GI symptoms. Blood and Urine Cx unremarkable thus far, continue to follow. Since there is no clear source of infection, abx have been discontinued. 2. AKI. Resolved with aggressive hydration. Likely prerenal azotemia in setting of dehydration with hypotension. Continue to monitor. 3. HTN. BP low on admission, requiring 4L NS in ED. Will resume Toprolol  today. 4. DM type 2. Still having episode of hypoglycemia. PO intake has been poor. Will hold basal insulin until PO intake is more reliable. Continue SSI 5. Hypernatremia. Likely due to dehydration and aggressive saline infusion. Trending down. Continue hypotonic fluids. 6. Chronic combined systolic/diastolic CHF. Past TTE showed EF 40-45%. Lasix currently being held due to renal dysfunction. Continue to monitor volume status closely. 7. Elevated troponin. Likely demand ischemia. 0.09 on admission, though pt denied any symptoms. EKG showed v-paced rhythm. CXR showed no active disease.   Code Status: Full DVT prophylaxis: Heparin Family Communication: Discussed with patient  Disposition Plan: Anticipate discharge in    Consultants:  Nephrology  Procedures:  none  Antibiotics:  Vancomycin 4/6 >> 4/8  Cefepime 4/6 >>4/8  HPI/Subjective: Doing pretty good. Will make attempts to eat more of the food provided.   Objective: Filed Vitals:   01/21/16 2218 01/22/16 0558  BP: 161/59 154/56  Pulse: 58 64  Temp: 98.1 F (36.7 C) 98 F (36.7 C)  Resp: 20 20    Intake/Output Summary (Last 24 hours) at 01/22/16 0845 Last data filed at 01/22/16 0800  Gross  per 24 hour  Intake 2553.75 ml  Output   1850 ml  Net 703.75 ml   Filed Weights   01/18/16 1328 01/19/16 0500 01/20/16 0400  Weight: 67.132 kg (148 lb) 63.6 kg (140 lb 3.4 oz) 64.2 kg (141 lb 8.6 oz)    Exam:  General: NAD. Appears calm and looks comfortable, lying in bed.  Cardiovascular: Regular rate and rhythm.  Respiratory: clear bilaterally, No wheezing, rales or rhonchi Abdomen: soft, non tender, no distention , bowel sounds normal Musculoskeletal: No edema b/l   Data Reviewed: Basic Metabolic Panel:  Recent Labs Lab 01/18/16 2252 01/19/16 0414 01/20/16 0820 01/21/16 0604 01/22/16 0523  NA 149* 154* 155* 150* 151*  K 4.1 4.1 3.3* 4.0 4.2  CL 118* 122* 128* 123* 121*  CO2 17* 16* 20* 22 22  GLUCOSE 157* 162* 142* 212* 157*  BUN 144* 138* 103* 70* 48*  CREATININE 5.13* 4.60* 2.53* 1.68* 1.22  CALCIUM 7.3* 7.8* 7.7* 7.6* 7.9*  PHOS  --   --  3.5 2.5 2.1*   Liver Function Tests:  Recent Labs Lab 01/18/16 1140 01/20/16 0820 01/21/16 0604 01/22/16 0523  AST 44*  --   --   --   ALT 40  --   --   --   ALKPHOS 90  --   --   --   BILITOT 0.5  --   --   --   PROT 7.5  --   --   --   ALBUMIN 3.6 2.5* 2.4* 2.5*    Recent Labs Lab 01/18/16 1140  LIPASE 56*   CBC:  Recent Labs Lab 01/18/16 1140 01/19/16 0414 01/21/16 0604  WBC 15.9*  12.0* 8.1  NEUTROABS  --  8.6*  --   HGB 14.6 12.0* 11.2*  HCT 44.9 37.9* 35.3*  MCV 87.4 89.2 88.5  PLT 164 144* 112*   Cardiac Enzymes:  Recent Labs Lab 01/18/16 1140  TROPONINI 0.09*   BNP (last 3 results)  Recent Labs  12/07/15 2144 12/31/15 1245  BNP 1063.0* 73.0   CBG:  Recent Labs Lab 01/21/16 1113 01/21/16 1633 01/21/16 1809 01/21/16 2218 01/22/16 0752  GLUCAP 197* 56* 116* 151* 140*    Recent Results (from the past 240 hour(s))  Urine culture     Status: None   Collection Time: 01/18/16  1:15 PM  Result Value Ref Range Status   Specimen Description URINE, CATHETERIZED  Final    Special Requests NONE  Final   Culture   Final    NO GROWTH 1 DAY [facility]    Report Status 01/19/2016 FINAL  Final  Blood Culture (routine x 2)     Status: None (Preliminary result)   Collection Time: 01/18/16  1:45 PM  Result Value Ref Range Status   Specimen Description BLOOD LEFT HAND  Final   Special Requests BOTTLES DRAWN AEROBIC AND ANAEROBIC 5CC EACH  Final   Culture NO GROWTH 3 DAYS  Final   Report Status PENDING  Incomplete  Blood Culture (routine x 2)     Status: None (Preliminary result)   Collection Time: 01/18/16  1:50 PM  Result Value Ref Range Status   Specimen Description BLOOD RIGHT HAND  Final   Special Requests BOTTLES DRAWN AEROBIC ONLY 4CC  Final   Culture NO GROWTH 3 DAYS  Final   Report Status PENDING  Incomplete  Gastrointestinal Panel by PCR , Stool     Status: None   Collection Time: 01/18/16  3:25 PM  Result Value Ref Range Status   Campylobacter species NOT DETECTED NOT DETECTED Final   Plesimonas shigelloides NOT DETECTED NOT DETECTED Final   Salmonella species NOT DETECTED NOT DETECTED Final   Yersinia enterocolitica NOT DETECTED NOT DETECTED Final   Vibrio species NOT DETECTED NOT DETECTED Final   Vibrio cholerae NOT DETECTED NOT DETECTED Final   Enteroaggregative E coli (EAEC) NOT DETECTED NOT DETECTED Final   Enteropathogenic E coli (EPEC) NOT DETECTED NOT DETECTED Final   Enterotoxigenic E coli (ETEC) NOT DETECTED NOT DETECTED Final   Shiga like toxin producing E coli (STEC) NOT DETECTED NOT DETECTED Final   E. coli O157 NOT DETECTED NOT DETECTED Final   Shigella/Enteroinvasive E coli (EIEC) NOT DETECTED NOT DETECTED Final   Cryptosporidium NOT DETECTED NOT DETECTED Final   Cyclospora cayetanensis NOT DETECTED NOT DETECTED Final   Entamoeba histolytica NOT DETECTED NOT DETECTED Final   Giardia lamblia NOT DETECTED NOT DETECTED Final   Adenovirus F40/41 NOT DETECTED NOT DETECTED Final   Astrovirus NOT DETECTED NOT  DETECTED Final   Norovirus GI/GII NOT DETECTED NOT DETECTED Final   Rotavirus A NOT DETECTED NOT DETECTED Final   Sapovirus (I, II, IV, and V) NOT DETECTED NOT DETECTED Final  MRSA PCR Screening     Status: Abnormal   Collection Time: 01/18/16  5:20 PM  Result Value Ref Range Status   MRSA by PCR POSITIVE (A) NEGATIVE Final    Comment:        The GeneXpert MRSA Assay (FDA approved for NASAL specimens only), is one component of a comprehensive MRSA colonization surveillance program. It is not intended to diagnose MRSA infection nor to guide  or monitor treatment for MRSA infections. RESULT CALLED TO, READ BACK BY AND VERIFIED WITH: WAGONER,R. RN AT 1717 ON 01/18/2016 BY BAUGHAM,M.   C difficile quick scan w PCR reflex     Status: None   Collection Time: 01/18/16  9:10 PM  Result Value Ref Range Status   C Diff antigen NEGATIVE NEGATIVE Final   C Diff toxin NEGATIVE NEGATIVE Final   C Diff interpretation Negative for toxigenic C. difficile  Final     Scheduled Meds: . aspirin  243 mg Oral Once  . aspirin EC  81 mg Oral Daily  . Chlorhexidine Gluconate Cloth  6 each Topical Q0600  . donepezil  5 mg Oral QHS  . escitalopram  10 mg Oral Daily  . fluticasone  1 spray Each Nare Daily  . haloperidol  1 mg Oral QHS  . heparin  5,000 Units Subcutaneous 3 times per day  . insulin aspart  0-15 Units Subcutaneous TID WC  . mupirocin ointment  1 application Nasal BID  . sodium chloride flush  3 mL Intravenous Q12H  . tamsulosin  0.4 mg Oral Daily  . thiamine  100 mg Oral Daily   Continuous Infusions: . dextrose 5 % 1,000 mL with potassium chloride 40 mEq infusion 125 mL/hr at 01/21/16 1413    Active Problems:   DM type 2 causing vascular disease (HCC)   Essential hypertension, benign   ARF (acute renal failure) (HCC)   Dehydration   Lactic acidosis   Hypernatremia   Elevated troponin   Chronic combined systolic and diastolic CHF (congestive heart failure) (HCC)   Acute renal  failure (HCC)    Time spent: 20 minutes    Erick Blinks, MD.  [facility] Pager [phone]. If 7PM-7AM, please contact night-coverage at [email or web address], password [facility] 01/22/2016, 8:45 AM  LOS: 4 days     By signing my name below, I, Zadie Cleverly, attest that this documentation has been prepared under the direction and in the presence of Erick Blinks, MD. Electronically signed: Zadie Cleverly, Scribe. 01/22/2016 11:30am   I, Dr. Erick Blinks, personally performed the services described in this documentaiton. All medical record entries made by the scribe were at my direction and in my presence. I have reviewed the chart and agree that the record reflects my personal performance and is accurate and complete  Erick Blinks, MD, 01/22/2016 11:38 AM

## 2016-01-22 NOTE — Evaluation (Signed)
Physical Therapy Evaluation Patient Details Name: Cody [name] Kristensen MRN: [id number] DOB: August 28, 1933 Today's Date: 01/22/2016   History of Present Illness  Cody [name] Fontan is a medically-complex 82 y.o. male with PMH of insulin-dependent diabetes mellitus, hypertension, advanced Parkinson dementia, and chronic combined systolic/diastolic CHF who presents from his SNF with reports of nausea, vomiting, diarrhea, generalized weakness, and abdominal pain.   Dx: severe sepsis due to UTI, ? colitis.   Clinical Impression  Pt received in bed, and was agreeable to PT evaluation.  Pt is currently not oriented to place, time, or situation, so it was difficult to obtain an accurate PLOF with no family present.  Pt was able to perform bed mobility with Min A, however, required Mod/Max A for sit<>stand transfer with RW.  Pt would benefit from continued skilled PT in acute care setting to optimize his strength and endurance, and he is recommended to follow up with PT at SNF to optimize his return to PLOF.     Follow Up Recommendations SNF    Equipment Recommendations  None recommended by PT    Recommendations for Other Services       Precautions / Restrictions Precautions Precautions: Fall      Mobility  Bed Mobility Overal bed mobility: Needs Assistance Bed Mobility: Sidelying to Sit     Supine to sit: Min assist     General bed mobility comments: Pt requires increased cues to be able to complete the task at hand.  HOB raised, but assistance required for pt to raise trunk up off bed to sitting position.  Pt was able to return sit<>supine with CGA.  After standing trial, pt required Min A for bed mobility inculding rolling side to side to perform hygiene tasks after BM.  Total assist for supine scoot for positioning.   Transfers Overall transfer level: Needs assistance Equipment used: Rolling walker (2 wheeled) Transfers: Sit to/from Stand Sit to Stand: Mod assist;Max assist          General transfer comment: Pt demonstrates significant posterior lean upon standing with significant flexed hip posture.  Pt is not able/willing to attempt weight shifting prior to returning back to the bed.  It was noted  upont returning back to the bed, that pt had a BM.    Ambulation/Gait Ambulation/Gait assistance:  (Not [facility]              Stairs            Wheelchair Mobility    Modified Rankin (Stroke Patients Only)       Balance Overall balance assessment: History of Falls;Needs assistance Sitting-balance support: No upper extremity supported Sitting balance-Leahy Scale: Good   Postural control: Posterior lean Standing balance support: Bilateral upper extremity supported Standing balance-Leahy Scale: Fair                               Pertinent Vitals/Pain Pain Assessment: No/denies pain    Home Living Family/patient expects to be discharged to:: Other (Comment) (H&P states he was at a SNF, but unsure if this was actually an ALF. )               Home Equipment: Walker - 2 wheels Additional Comments: Pt states that he ambulates with a RW.     Prior Function Level of Independence: Needs assistance   Gait / Transfers Assistance Needed: unsure of the amount of assistance required.  Pt  states [place] amb[place], however he is currently not oriented to place, time, or situation and is an unreliable historian.   ADL's / Homemaking Assistance Needed: Staff assisted  Comments: loss of left eye, unsure as to the amount of sight in the right eye     Hand Dominance        Extremity/Trunk Assessment   Upper Extremity Assessment: Generalized weakness           Lower Extremity Assessment: Generalized weakness      Cervical / Trunk Assessment: Kyphotic  Communication   Communication: HOH;Other (comment) (Communication difficulties due to advanced dementia, and need for repetition during tx. )   Cognition Arousal/Alertness: Awake/alert Behavior During Therapy: Restless (Requires repetition to complete a task prior to returning himself to the bed. ) Overall Cognitive Status: History of cognitive impairments - at baseline       Memory: Decreased short-term memory              General Comments      Exercises        Assessment/Plan    PT Assessment Patient needs continued PT services  PT Diagnosis Generalized weakness   PT Problem List Decreased strength;Decreased activity tolerance;Decreased balance;Decreased mobility;Decreased knowledge of use of DME;Decreased safety awareness;Decreased knowledge of precautions  PT Treatment Interventions Gait training;Functional mobility training;Therapeutic activities;Therapeutic exercise;Balance training;Patient/family education   PT Goals (Current goals can be found in the Care Plan section) Acute Rehab PT Goals Patient Stated Goal: to return to facility PT Goal Formulation: With patient Time For Goal Achievement: 02/05/16 Potential to Achieve Goals: Fair    Frequency Min 4X/week   Barriers to discharge   Staff is having a difficult time getting pt to eat.    Co-evaluation               End of Session Equipment Utilized During Treatment: Gait belt Activity Tolerance: Patient limited by fatigue Patient left: in bed;with bed alarm set;with call bell/phone within reach Nurse Communication: Mobility status    Functional Assessment Tool Used: Clincal judgement Functional Limitation: Mobility: Walking and moving around Mobility: Walking and Moving Around Current Status (E9528): At least 40 percent but less than 60 percent impaired, limited or restricted Mobility: Walking and Moving Around Goal Status [phone]): At least 1 percent but less than 20 percent impaired, limited or restricted    Time: 4010-2725 PT Time Calculation (min) (ACUTE ONLY): 29 min   Charges:   PT Evaluation $PT Eval High Complexity: 1  Procedure PT Treatments $Therapeutic Activity: 8-22 mins   PT G Codes:   PT G-Codes **NOT FOR INPATIENT CLASS** Functional Assessment Tool Used: Clincal judgement Functional Limitation: Mobility: Walking and moving around Mobility: Walking and Moving Around Current Status (D6644): At least 40 percent but less than 60 percent impaired, limited or restricted Mobility: Walking and Moving Around Goal Status [phone]): At least 1 percent but less than 20 percent impaired, limited or restricted    Carollee Herter, PT, DPT X: [id number]   01/22/2016, 4:39 PM

## 2016-01-22 NOTE — Progress Notes (Signed)
Subjective: Interval History: Patient offers no complaint. Do difficulty in breathing  Objective: Vital signs in last 24 hours: Temp:  [98 F (36.7 C)-98.5 F (36.9 C)] 98 F (36.7 C) (04/10 0558) Pulse Rate:  [58-65] 64 (04/10 0558) Resp:  [20] 20 (04/10 0558) BP: (154-161)/(56-63) 154/56 mmHg (04/10 0558) SpO2:  [97 %-99 %] 99 % (04/10 0558) Weight change:   Intake/Output from previous day: 04/09 0701 - 04/10 0700 In: 2553.8 [P.O.:360; I.V.:2193.8] Out: 1850 [Urine:1850] Intake/Output this shift: Total I/O In: 120 [P.O.:120] Out: -   General appearance: alert, cooperative and no distress Resp: clear to auscultation bilaterally Cardio: regular rate and rhythm, S1, S2 normal, no murmur, click, rub or gallop GI: soft, non-tender; bowel sounds normal; no masses,  no organomegaly Extremities: extremities normal, atraumatic, no cyanosis or edema  Lab Results:  Recent Labs  01/21/16 0604  WBC 8.1  HGB 11.2*  HCT 35.3*  PLT 112*   BMET:   Recent Labs  01/21/16 0604 01/22/16 0523  NA 150* 151*  K 4.0 4.2  CL 123* 121*  CO2 22 22  GLUCOSE 212* 157*  BUN 70* 48*  CREATININE 1.68* 1.22  CALCIUM 7.6* 7.9*   No results for input(s): PTH in the last 72 hours. Iron Studies: No results for input(s): IRON, TIBC, TRANSFERRIN, FERRITIN in the last 72 hours.  Studies/Results: No results found.  I have reviewed the patient's current medications.  Assessment/Plan: Problem #1 acute kidney injury: Most likely prerenal/ATN. His renal function continue to improve Problem #2 hypernatremia: His sodium is 151 and still high. Possibly form his ivf with potassium. His po intake is limited Problem #3 history of diabetes: His blood sugar is reasonably controlled Problem #4 hypertension: Patient came with hypotension presently his blood pressure has improved. Problem #5 history of chronic congestive heart failure: Patient is asymptotmatic Problem #6 low CO2 possibly secondary to  metabolic acidosis. He CO2 is 22 and progressively improving. Problem #7 possible UTI: Presently he is a febrile and his white blood cell count is normal. Plan: 1] change ivf to D5w at 100 cc/hr 2] we'll check renal panel today and tomorrow.   LOS: 4 days   See Beharry S 01/22/2016,9:00 AM

## 2016-01-22 NOTE — Progress Notes (Signed)
Inpatient Diabetes Program Recommendations  AACE/ADA: New Consensus Statement on Inpatient Glycemic Control (2015)  Target Ranges:  Prepandial:   less than 140 mg/dL      Peak postprandial:   less than 180 mg/dL (1-2 hours)      Critically ill patients:  140 - 180 mg/dL   Review of Glycemic Control Results for Cody CulverBELTON, [name] (MRN [id number]) as of 01/22/2016 12:27  Ref. Range 01/21/2016 16:33 01/21/2016 18:09 01/21/2016 22:18 01/22/2016 07:52 01/22/2016 11:30  Glucose-Capillary Latest Ref Range: 65-99 mg/dL 56 (L) 782116 (H) 956151 (H) 140 (H) 203 (H)   Outpatient Diabetes medications: Levemir 40 units + Humalog 15 units tid with meals + Tradjenta 5 mg q d Current orders for Inpatient glycemic control: Novolog correction Moderate  Inpatient Diabetes Program Recommendations:  Please consider Levemir 15 units daily and decrease in Novolog correction scale to Sensitive 0-9 tid.  Thank you, [name], RN, MSN, CDE Inpatient Glycemic Control Team Team Pager [phone] (8am-5pm) 01/22/2016 12:30 PM

## 2016-01-23 DIAGNOSIS — R7989 Other specified abnormal findings of blood chemistry: Secondary | ICD-10-CM

## 2016-01-23 DIAGNOSIS — N179 Acute kidney failure, unspecified: Secondary | ICD-10-CM

## 2016-01-23 DIAGNOSIS — I5042 Chronic combined systolic (congestive) and diastolic (congestive) heart failure: Secondary | ICD-10-CM

## 2016-01-23 DIAGNOSIS — E870 Hyperosmolality and hypernatremia: Secondary | ICD-10-CM

## 2016-01-23 LAB — GLUCOSE, CAPILLARY
GLUCOSE-CAPILLARY: 245 mg/dL — AB (ref 65–99)
GLUCOSE-CAPILLARY: 305 mg/dL — AB (ref 65–99)
Glucose-Capillary: 231 mg/dL — ABNORMAL HIGH (ref 65–99)
Glucose-Capillary: 306 mg/dL — ABNORMAL HIGH (ref 65–99)

## 2016-01-23 LAB — CULTURE, BLOOD (ROUTINE X 2)
CULTURE: NO GROWTH
Culture: NO GROWTH

## 2016-01-23 LAB — RENAL FUNCTION PANEL
ALBUMIN: 2.4 g/dL — AB (ref 3.5–5.0)
ANION GAP: 9 (ref 5–15)
BUN: 34 mg/dL — ABNORMAL HIGH (ref 6–20)
CALCIUM: 8 mg/dL — AB (ref 8.9–10.3)
CO2: 24 mmol/L (ref 22–32)
CREATININE: 1.15 mg/dL (ref 0.61–1.24)
Chloride: 115 mmol/L — ABNORMAL HIGH (ref 101–111)
GFR, EST NON AFRICAN AMERICAN: 58 mL/min — AB (ref >60)
Glucose, Bld: 270 mg/dL — ABNORMAL HIGH (ref 65–99)
PHOSPHORUS: 2.5 mg/dL (ref 2.5–4.6)
Potassium: 3.9 mmol/L (ref 3.5–5.1)
SODIUM: 148 mmol/L — AB (ref 135–145)

## 2016-01-23 MED ORDER — INSULIN DETEMIR 100 UNIT/ML ~~LOC~~ SOLN
10.0000 [IU] | Freq: Every day | SUBCUTANEOUS | Status: DC
  Administered 2016-01-23 – 2016-01-24 (×2): 10 [IU] via SUBCUTANEOUS
  Filled 2016-01-23 (×3): qty 0.1

## 2016-01-23 NOTE — Progress Notes (Signed)
[facility] PROGRESS NOTE  Cody CulverRussell E [name] ZOX:[id number] DOB: 07-07-1933 DOA: 01/18/2016 PCP: Laqueta LindenKONESWARAN, SURESH A, MD Summary  [age] yom presented from SNF with complaints of nausea, vomiting, generalized weakness and abdominal pain. He was found to be hypotensive and with AKI secondary to dehydration. There initially were concerns for underlying colitis vs possible UTI. All cultures and stool studies were unremarkable, and pt's symptoms appeared to have resolved. Renal functions improved with IVF, electrolytes continue to improve. Anticipate discharge in 24 hours.   Assessment/Plan: 1. Hypotension, secondary to dehydration. Resolved. WBC and lactic acid elevated on admission however now wnl. CT abd findings reflect stercoral colitis vs mild segmental colitis. GI panel negative. C Diff negative. He is not having any GI symptoms. Blood and Urine Cx unremarkable thus far, continue to follow. Since there is no clear source of infection, abx have been discontinued. 2. AKI. Resolved with aggressive hydration. Likely prerenal azotemia in setting of dehydration with hypotension. Continue to monitor. 3. HTN. BP low on admission, requiring 4L NS in ED. Resumed Toprolol 25mg . 4. DM type 2. Blood sugars are trending up, will restart 10 units of Levemir. Encourage PO intake. Continue SSI 5. Hypernatremia. Likely due to dehydration and aggressive saline infusion. Trending down. Continue hypotonic fluids. 6. Chronic combined systolic/diastolic CHF. Past TTE showed EF 40-45%. Lasix currently being held due to renal dysfunction. Continue to monitor volume status closely. 7. Elevated troponin. Likely demand ischemia. 0.09 on admission, though pt denied any symptoms. EKG showed v-paced rhythm. CXR showed no active disease.   Code Status: Full DVT prophylaxis: Heparin Family Communication: Discussed with patient  Disposition Plan: Anticipate discharge in 24 hours.     Consultants:  Nephrology  PT-SNF  Procedures:  none  Antibiotics:  Vancomycin 4/6 >> 4/8  Cefepime 4/6 >>4/8  HPI/Subjective: Feeling pretty good today. Ate breakfast   Objective: Filed Vitals:   01/22/16 2109 01/23/16 0541  BP: 172/58 167/77  Pulse: 63 66  Temp: 98.6 F (37 C) 99 F (37.2 C)  Resp: 20 20    Intake/Output Summary (Last 24 hours) at 01/23/16 0855 Last data filed at 01/22/16 1200  Gross per 24 hour  Intake    103 ml  Output      0 ml  Net    103 ml   Filed Weights   01/18/16 1328 01/19/16 0500 01/20/16 0400  Weight: 67.132 kg (148 lb) 63.6 kg (140 lb 3.4 oz) 64.2 kg (141 lb 8.6 oz)    Exam:  General: NAD. Appears calm and more alert.  Cardiovascular: Regular rate and rhythm.  Respiratory: clear bilaterally, No wheezing, rales or rhonchi Abdomen: soft, non tender, no distention , bowel sounds normal Musculoskeletal: No edema b/l   Data Reviewed: Basic Metabolic Panel:  Recent Labs Lab 01/19/16 0414 01/20/16 0820 01/21/16 0604 01/22/16 0523 01/23/16 0523  NA 154* 155* 150* 151* 148*  K 4.1 3.3* 4.0 4.2 3.9  CL 122* 128* 123* 121* 115*  CO2 16* 20* 22 22 24   GLUCOSE 162* 142* 212* 157* 270*  BUN 138* 103* 70* 48* 34*  CREATININE 4.60* 2.53* 1.68* 1.22 1.15  CALCIUM 7.8* 7.7* 7.6* 7.9* 8.0*  PHOS  --  3.5 2.5 2.1* 2.5   Liver Function Tests:  Recent Labs Lab 01/18/16 1140 01/20/16 0820 01/21/16 0604 01/22/16 0523 01/23/16 0523  AST 44*  --   --   --   --   ALT 40  --   --   --   --  ALKPHOS 90  --   --   --   --   BILITOT 0.5  --   --   --   --   PROT 7.5  --   --   --   --   ALBUMIN 3.6 2.5* 2.4* 2.5* 2.4*    Recent Labs Lab 01/18/16 1140  LIPASE 56*   CBC:  Recent Labs Lab 01/18/16 1140 01/19/16 0414 01/21/16 0604  WBC 15.9* 12.0* 8.1  NEUTROABS  --  8.6*  --   HGB 14.6 12.0* 11.2*  HCT 44.9 37.9* 35.3*  MCV 87.4 89.2 88.5  PLT 164 144* 112*   Cardiac Enzymes:  Recent Labs Lab  01/18/16 1140  TROPONINI 0.09*   BNP (last 3 results)  Recent Labs  12/07/15 2144 12/31/15 1245  BNP 1063.0* 73.0   CBG:  Recent Labs Lab 01/22/16 0752 01/22/16 1130 01/22/16 1636 01/22/16 2109 01/23/16 0735  GLUCAP 140* 203* 135* 174* 245*    Recent Results (from the past 240 hour(s))  Urine culture     Status: None   Collection Time: 01/18/16  1:15 PM  Result Value Ref Range Status   Specimen Description URINE, CATHETERIZED  Final   Special Requests NONE  Final   Culture   Final    NO GROWTH 1 DAY Performed at [facility]    Report Status 01/19/2016 FINAL  Final  Blood Culture (routine x 2)     Status: None (Preliminary result)   Collection Time: 01/18/16  1:45 PM  Result Value Ref Range Status   Specimen Description BLOOD LEFT HAND  Final   Special Requests BOTTLES DRAWN AEROBIC AND ANAEROBIC 5CC EACH  Final   Culture NO GROWTH 4 DAYS  Final   Report Status PENDING  Incomplete  Blood Culture (routine x 2)     Status: None (Preliminary result)   Collection Time: 01/18/16  1:50 PM  Result Value Ref Range Status   Specimen Description BLOOD RIGHT HAND  Final   Special Requests BOTTLES DRAWN AEROBIC ONLY 4CC  Final   Culture NO GROWTH 4 DAYS  Final   Report Status PENDING  Incomplete  Gastrointestinal Panel by PCR , Stool     Status: None   Collection Time: 01/18/16  3:25 PM  Result Value Ref Range Status   Campylobacter species NOT DETECTED NOT DETECTED Final   Plesimonas shigelloides NOT DETECTED NOT DETECTED Final   Salmonella species NOT DETECTED NOT DETECTED Final   Yersinia enterocolitica NOT DETECTED NOT DETECTED Final   Vibrio species NOT DETECTED NOT DETECTED Final   Vibrio cholerae NOT DETECTED NOT DETECTED Final   Enteroaggregative E coli (EAEC) NOT DETECTED NOT DETECTED Final   Enteropathogenic E coli (EPEC) NOT DETECTED NOT DETECTED Final   Enterotoxigenic E coli (ETEC) NOT DETECTED NOT DETECTED Final   Shiga like toxin producing E coli  (STEC) NOT DETECTED NOT DETECTED Final   E. coli O157 NOT DETECTED NOT DETECTED Final   Shigella/Enteroinvasive E coli (EIEC) NOT DETECTED NOT DETECTED Final   Cryptosporidium NOT DETECTED NOT DETECTED Final   Cyclospora cayetanensis NOT DETECTED NOT DETECTED Final   Entamoeba histolytica NOT DETECTED NOT DETECTED Final   Giardia lamblia NOT DETECTED NOT DETECTED Final   Adenovirus F40/41 NOT DETECTED NOT DETECTED Final   Astrovirus NOT DETECTED NOT DETECTED Final   Norovirus GI/GII NOT DETECTED NOT DETECTED Final   Rotavirus A NOT DETECTED NOT DETECTED Final   Sapovirus (I, II, IV, and V) NOT DETECTED  NOT DETECTED Final  MRSA PCR Screening     Status: Abnormal   Collection Time: 01/18/16  5:20 PM  Result Value Ref Range Status   MRSA by PCR POSITIVE (A) NEGATIVE Final    Comment:        The GeneXpert MRSA Assay (FDA approved for NASAL specimens only), is one component of a comprehensive MRSA colonization surveillance program. It is not intended to diagnose MRSA infection nor to guide or monitor treatment for MRSA infections. RESULT CALLED TO, READ BACK BY AND VERIFIED WITH: WAGONER,R. RN AT 1717 ON 01/18/2016 BY BAUGHAM,M.   C difficile quick scan w PCR reflex     Status: None   Collection Time: 01/18/16  9:10 PM  Result Value Ref Range Status   C Diff antigen NEGATIVE NEGATIVE Final   C Diff toxin NEGATIVE NEGATIVE Final   C Diff interpretation Negative for toxigenic C. difficile  Final     Scheduled Meds: . aspirin  243 mg Oral Once  . aspirin EC  81 mg Oral Daily  . Chlorhexidine Gluconate Cloth  6 each Topical Q0600  . donepezil  5 mg Oral QHS  . escitalopram  10 mg Oral Daily  . fluticasone  1 spray Each Nare Daily  . haloperidol  1 mg Oral QHS  . heparin  5,000 Units Subcutaneous 3 times per day  . insulin aspart  0-15 Units Subcutaneous TID WC  . metoprolol succinate  25 mg Oral Daily  . mupirocin ointment  1 application Nasal BID  . sodium chloride flush  3  mL Intravenous Q12H  . tamsulosin  0.4 mg Oral Daily  . thiamine  100 mg Oral Daily   Continuous Infusions: . dextrose 100 mL/hr at 01/22/16 2132    Active Problems:   DM type 2 causing vascular disease (HCC)   Essential hypertension, benign   ARF (acute renal failure) (HCC)   Dehydration   Lactic acidosis   Hypernatremia   Elevated troponin   Chronic combined systolic and diastolic CHF (congestive heart failure) (HCC)   Acute renal failure (HCC)    Time spent: 20 minutes    Erick Blinks, MD.  [facility] Pager [phone]. If 7PM-7AM, please contact night-coverage at [email or web address], password [facility] 01/23/2016, 8:55 AM  LOS: 5 days     By signing my name below, I, Zadie Cleverly, attest that this documentation has been prepared under the direction and in the presence of Erick Blinks, MD. Electronically signed: Zadie Cleverly, Scribe. 01/23/2016 8:50am   I, Dr. Erick Blinks, personally performed the services described in this documentaiton. All medical record entries made by the scribe were at my direction and in my presence. I have reviewed the chart and agree that the record reflects my personal performance and is accurate and complete  Erick Blinks, MD, 01/23/2016 9:05 AM

## 2016-01-23 NOTE — [place] (Signed)
[place]ICA[place] LEVEL OF CARE SCREENING TOOL     IDENTIFICATION  Patient Name: Cody [name] Birthdate: 1933-01-17 Sex: male Admission Date (Current Location): 01/18/2016  [facility] [place] [facility] [place] [facility]:  [employer] and Address:  [facility],  [address], [place] [address]      Provider Number: [id number]  Attending Physician Name and Address:  Erick Blinks, MD  Relative Name and Phone Number:   Richardean Chimera Relative [phone])    Current Level of Care: Hospital Recommended Level of Care: Skilled Nursing Facility Prior Approval Number:    Date Approved/Denied:   PASRR Number:  [id number])  Discharge Plan: SNF    Current Diagnoses: Patient Active Problem List   Diagnosis Date Noted  . ARF (acute renal failure) (HCC) 01/18/2016  . Dehydration 01/18/2016  . Lactic acidosis 01/18/2016  . Hypernatremia 01/18/2016  . Elevated troponin 01/18/2016  . Chronic combined systolic and diastolic CHF (congestive heart failure) (HCC) 01/18/2016  . Acute renal failure (HCC)   . Falls 12/31/2015  . Generalized weakness 12/31/2015  . Acute on chronic renal insufficiency (HCC) 12/31/2015  . Gait disturbance 12/31/2015  . Volume depletion 12/31/2015  . CHF (congestive heart failure) (HCC) 12/08/2015  . Acute on chronic combined systolic and diastolic CHF (congestive heart failure) (HCC) 12/08/2015  . Normocytic anemia   . Atrioventricular block, complete (HCC) 11/30/2015  . Complete heart block (HCC)   . Essential hypertension, benign 09/28/2015  . Syncope 12/05/2014  . BPH (benign prostatic hyperplasia) 12/05/2014  . Dementia 12/05/2014  . DM type 2 causing vascular disease (HCC) 12/05/2014    Orientation RESPIRATION BLADDER Height & Weight     Self  Normal Incontinent Weight: 141 lb 8.6 oz (64.2 kg) Height:   (172.7 cm)  BEHAVIORAL SYMPTOMS/MOOD NEUROLOGICAL BOWEL NUTRITION STATUS      Incontinent Diet (Diet Carb Modified  pureed)  AMBULATORY STATUS COMMUNICATION OF NEEDS Skin   Extensive Assist Verbally Normal                       Personal Care Assistance Level of Assistance  Bathing, Feeding, Dressing Bathing Assistance: Limited assistance Feeding assistance: Limited assistance Dressing Assistance: Limited assistance     Functional Limitations Info  Sight, Hearing, Speech Sight Info: Adequate Hearing Info: Adequate      SPECIAL CARE FACTORS FREQUENCY  PT (By licensed PT)     PT Frequency:  (5x/week)              Contractures Contractures Info: Not present    Additional Factors Info  Code Status, Allergies, Psychotropic, Insulin Sliding Scale, Isolation Precautions Code Status Info:  (Full code) Allergies Info:  (Sulfa Antibiotics) Psychotropic Info:  (Lexapro, Haldol) Insulin Sliding Scale Info:  (3x daily) Isolation Precautions Info:  (11/30/15 MRSA PCR contact precautions)     Current Medications (01/23/2016):  This is the current hospital active medication list Current Facility-Administered Medications  Medication Dose Route Frequency Provider Last Rate Last Dose  . acetaminophen (TYLENOL) tablet 650 mg  650 mg Oral Q6H PRN Briscoe Deutscher, MD   650 mg at 01/20/16 0007   Or  . acetaminophen (TYLENOL) suppository 650 mg  650 mg Rectal Q6H PRN Briscoe Deutscher, MD      . aspirin chewable tablet 243 mg  243 mg Oral Once Briscoe Deutscher, MD   243 mg at 01/18/16 1730  . aspirin EC tablet 81 mg  81 mg Oral Daily Timothy  S Opyd, MD   81 mg at 01/23/16 0923  . Chlorhexidine Gluconate Cloth 2 % PADS 6 each  6 each Topical Q0600 Erick BlinksJehanzeb Memon, MD   6 each at 01/23/16 0700  . dextrose 5 % solution   Intravenous Continuous Salomon MastBelayenh Befekadu, MD 100 mL/hr at 01/22/16 2132    . donepezil (ARICEPT) tablet 5 mg  5 mg Oral QHS Briscoe Deutscherimothy S Opyd, MD   5 mg at 01/22/16 2133  . escitalopram (LEXAPRO) tablet 10 mg  10 mg Oral Daily Briscoe Deutscherimothy S Opyd, MD   10 mg at 01/23/16 0923  . fluticasone (FLONASE)  50 MCG/ACT nasal spray 1 spray  1 spray Each Nare Daily Briscoe Deutscherimothy S Opyd, MD   1 spray at 01/23/16 0926  . haloperidol (HALDOL) tablet 1 mg  1 mg Oral QHS Lavone Neriimothy S Opyd, MD   1 mg at 01/22/16 2133  . heparin injection 5,000 Units  5,000 Units Subcutaneous 3 times per day Briscoe Deutscherimothy S Opyd, MD   5,000 Units at 01/23/16 0700  . HYDROcodone-acetaminophen (NORCO/VICODIN) 5-325 MG per tablet 1-2 tablet  1-2 tablet Oral Q4H PRN Lavone Neriimothy S Opyd, MD      . insulin aspart (novoLOG) injection 0-15 Units  0-15 Units Subcutaneous TID WC Briscoe Deutscherimothy S Opyd, MD   5 Units at 01/23/16 [id number]  . insulin detemir (LEVEMIR) injection 10 Units  10 Units Subcutaneous Daily Erick BlinksJehanzeb Memon, MD      . metoprolol succinate (TOPROL-XL) 24 hr tablet 25 mg  25 mg Oral Daily Erick BlinksJehanzeb Memon, MD   25 mg at 01/23/16 0923  . mupirocin ointment (BACTROBAN) 2 % 1 application  1 application Nasal BID Erick BlinksJehanzeb Memon, MD   1 application at 01/23/16 0926  . ondansetron (ZOFRAN) tablet 4 mg  4 mg Oral Q6H PRN Briscoe Deutscherimothy S Opyd, MD       Or  . ondansetron (ZOFRAN) injection 4 mg  4 mg Intravenous Q6H PRN Lavone Neriimothy S Opyd, MD      . sodium chloride flush (NS) 0.9 % injection 3 mL  3 mL Intravenous Q12H Briscoe Deutscherimothy S Opyd, MD   3 mL at 01/23/16 0926  . tamsulosin (FLOMAX) capsule 0.4 mg  0.4 mg Oral Daily Briscoe Deutscherimothy S Opyd, MD   0.4 mg at 01/23/16 0923  . thiamine (VITAMIN B-1) tablet 100 mg  100 mg Oral Daily Briscoe Deutscherimothy S Opyd, MD   100 mg at 01/23/16 [id number]     Discharge Medications: Please see discharge summary for a list of discharge medications.  Relevant Imaging Results:  Relevant Lab Results:   Additional Information    Chakara Bognar, Juleen ChinaHeather D, LCSW

## 2016-01-23 NOTE — Progress Notes (Signed)
Subjective: Interval History: Patient feels good and no nausea or vomiting. He denies any difficulty in breathing  Objective: Vital signs in last 24 hours: Temp:  [98.1 F (36.7 C)-99 F (37.2 C)] 99 F (37.2 C) (04/11 0541) Pulse Rate:  [60-66] 66 (04/11 0541) Resp:  [18-20] 20 (04/11 0541) BP: (156-176)/(58-77) 167/77 mmHg (04/11 0541) SpO2:  [97 %-99 %] 98 % (04/11 0541) Weight change:   Intake/Output from previous day: 04/10 0701 - 04/11 0700 In: 223 [P.O.:220; I.V.:3] Out: -  Intake/Output this shift:    General appearance: alert, cooperative and no distress Resp: clear to auscultation bilaterally Cardio: regular rate and rhythm, S1, S2 normal, no murmur, click, rub or gallop GI: soft, non-tender; bowel sounds normal; no masses,  no organomegaly Extremities: extremities normal, atraumatic, no cyanosis or edema  Lab Results:  Recent Labs  01/21/16 0604  WBC 8.1  HGB 11.2*  HCT 35.3*  PLT 112*   BMET:   Recent Labs  01/22/16 0523 01/23/16 0523  NA 151* 148*  K 4.2 3.9  CL 121* 115*  CO2 22 24  GLUCOSE 157* 270*  BUN 48* 34*  CREATININE 1.22 1.15  CALCIUM 7.9* 8.0*   No results for input(s): PTH in the last 72 hours. Iron Studies: No results for input(s): IRON, TIBC, TRANSFERRIN, FERRITIN in the last 72 hours.  Studies/Results: No results found.  I have reviewed the patient's current medications.  Assessment/Plan: Problem #1 acute kidney injury: Most likely prerenal/ATN. creatinine is reaching his base line Problem #2 hypernatremia: His sodium is 148 improving Problem #3 history of diabetes: His blood sugar is reasonably controlled Problem #4 hypertension: Patient came with hypotension presently his blood pressure has improved. Problem #5 history of chronic congestive heart failure: Patient is asymptotmatic Problem #6 low CO2 possibly secondary to metabolic acidosis. Has corrected Problem #7 possible UTI: Presently he is a febrile and his white  blood cell count is normal. Plan: 1] continue with present treatment and encourage free water intake 2] we'll check renal panel today and tomorrow.   LOS: 5 days   Ishitha Roper S 01/23/2016,8:15 AM

## 2016-01-23 NOTE — Progress Notes (Signed)
Physical Therapy Treatment Patient Details Name: Cody CulverRussell E Rhymes MRN: [id number] DOB: 1932/11/27 Today's Date: 01/23/2016    History of Present Illness Cody [name] is a medically-complex 82 y.o. male with PMH of insulin-dependent diabetes mellitus, hypertension, advanced Parkinson dementia, and chronic combined systolic/diastolic CHF who presents from his SNF with reports of nausea, vomiting, diarrhea, generalized weakness, and abdominal pain.   Dx: severe sepsis due to UTI, ? colitis.     PT Comments    Pt received in bed, and was agreeable to PT tx.  Pt was soiled upon entering the room, therefore, bed mobility performed to assist with hygiene.  Pt required Min A for rolling either direction, and Min A for supine<>sit.  Pt was able to perform standing trials x 3 reps, however, he is not able to come to a full stand due to poor hip extension.  During standing trials he grimaces and expressed that he was having pain in his abdomen when trying to stand.  Pt not able to follow commands for lateral steps today.  Continue to recommend SNF at this time due to pt's significant weakness, and poor endurance.    Follow Up Recommendations  SNF     Equipment Recommendations  None recommended by PT    Recommendations for Other Services       Precautions / Restrictions Precautions Precautions: Fall    Mobility  Bed Mobility Overal bed mobility: Needs Assistance Bed Mobility: Rolling;Supine to Sit;Sit to Supine Rolling: Min assist (Pt noted to be soiled upon entering the room.  Rolling performed to assist with hygiene needs. )   Supine to sit: Min assist Sit to supine: Min guard (Pt is impulsive when going to sit down.)   General bed mobility comments: Pt tends to lie in fetal-like position on his L side, but requires Min A for rolling, and transfers supine <> sit with repetition to keep him focused on the task.   Transfers Overall transfer level: Needs assistance Equipment used:  Rolling walker (2 wheeled) Transfers: Sit to/from Stand Sit to Stand: Max assist         General transfer comment: x 3 trials.  Pt is able to come about 1/2 way up, but then is unable to fully extend his hips to get his center of gravity shifted anteriorly and over his feet.  Pt demonstrates a strong posterior lean, which requires Max A to prevent pt from falling back onto the bed.    Ambulation/Gait Ambulation/Gait assistance:  (Attempted to have pt take lateral steps along the EOB, but pt unable to comprehend instructions, and kept turning the RW towards the PT instead of side stepping. Pt refused fwd ambulation. )               Stairs            Wheelchair Mobility    Modified Rankin (Stroke Patients Only)       Balance Overall balance assessment: Needs assistance;History of Falls Sitting-balance support: No upper extremity supported     Postural control: Posterior lean Standing balance support: Bilateral upper extremity supported (along with Max A from PT) Standing balance-Leahy Scale: Poor                      Cognition Arousal/Alertness: Awake/alert Behavior During Therapy: WFL for tasks assessed/performed Overall Cognitive Status: History of cognitive impairments - at baseline       Memory: Decreased short-term memory;Decreased recall of precautions  Exercises      General Comments        Pertinent Vitals/Pain Pain Assessment: Faces (Pt initially states he does not have any pain, however upon standing he winces and groans and asks to lay back down. ) Faces Pain Scale: Hurts little more Pain Location: abdomen Pain Intervention(s): Limited activity within patient's tolerance;Monitored during session;Repositioned    Home Living                      Prior Function            PT Goals (current goals can now be found in the care plan section) Acute Rehab PT Goals Patient Stated Goal: to return to facility PT  Goal Formulation: With patient Time For Goal Achievement: 02/05/16 Potential to Achieve Goals: Fair Progress towards PT goals: Progressing toward goals    Frequency       PT Plan Current plan remains appropriate    Co-evaluation             End of Session Equipment Utilized During Treatment: Gait belt Activity Tolerance: Patient limited by fatigue Patient left: in bed;with bed alarm set     Time: 1610-9604 PT Time Calculation (min) (ACUTE ONLY): 23 min  Charges:  $Therapeutic Activity: 23-37 mins                    G Codes:      Cody [name], PT, DPT X: [id number]   01/23/2016, 3:56 PM

## 2016-01-23 NOTE — Discharge Summary (Signed)
Physician Discharge Summary  Cody [name] ZOX:[id number] DOB: February 08, 1933 DOA: 01/18/2016  PCP: Laqueta LindenKONESWARAN, SURESH A, MD  Admit date: 01/18/2016 Discharge date: 01/24/2016  Recommendations for Outpatient Follow-up:   Follow up with PCP in 1-2 weeks.  Follow up chronic CHF. Currenlty not requiring diuretic therapy, suggest daily weights and resume Lasix as needed  Follow-up mild thrombocytopenia, suggest recheck CBC in one week  Follow-up Information    Follow up with [facility].   Contact information:   [address] [place] [id number] [phone]       Follow up with [facility].   Specialty:  Skilled Nursing Facility   Contact information:   [address] [place] [id number] [phone]     Discharge Diagnoses:   HypotensionSecondary to dehydration  Acute kidney injury.  Hypertension.  Diabetes mellitus type 2.   Hypernatremia.  Chronic combined systolic and diastolic congestive heart failure..  Elevated troponin secondary to demand ischemia  Dementia  Aortic atherosclerosis.  Mild thrombocytopenia.  Discharge Condition: Improved Disposition: Discharge to SNF  Diet recommendation: Heart healthy, low sodium   Filed Weights   01/18/16 1328 01/19/16 0500 01/20/16 0400  Weight: 67.132 kg (148 lb) 63.6 kg (140 lb 3.4 oz) 64.2 kg (141 lb 8.6 oz)    History of present illness:  82 year old man presented with nausea, vomiting, diarrhea, generalized weakness and abdominal pain. Initial evaluation revealed hypoxia and hypotension as well as acute kidney injury with anion gap metabolic acidosis, lactic acidosis. Admitted for sepsis secondary to UTI, acute kidney injury, and possible colitis.  Hospital Course:  Patient was admitted for what was thought to be sepsis and possible colitis. However all culture data and stool studies were unremarkable and the patient's symptoms resolved rapidly with  hydration and ultimately acute presentation initially thought to be sepsis was rather hypotension secondary to dehydration. Since there was no clear source of infection abx was discontinued. Patient continues to improve and renal function has normalized. Physical therapy recommended skilled nursing facility rehabilitation. Individual issues as below. 1. Hypotension, secondary to dehydration. Resolved. Sepsis ruled out. Urine culture and blood cultures were negative. GI pathogen panel and C. difficile testing was negative. Chest x-ray no acute disease. CT abdomen and pelvis on admission suggested desiccated stool, stercoral colitis. Bowels moving well. Asymptomatic. 2. Acute kidney injury. Resolved with aggressive hydration. Secondary to ATN/prerenal/vomiting and GI losses. 3. Hypertension. Stable. 4. Diabetes mellitus type 2. Stable. 5. Hypernatremia secondary to dehydration. Resolved. 6. Chronic combined systolic and diastolic congestive heart failure has remained stable. 7. Elevated troponin. Secondary to demand ischemia. EKG showed a ventricularly paced rhythm. Chest x-ray no acute disease. 8. Advanced Parkinson's 9. Dementia 10. Aortic atherosclerosis. 11. Mild thrombocytopenia, seen in the past. Suggest outpatient follow-up.  Consultants:  Nephrology  PT-SNF  Procedures:  None  Antibiotics:  Vancomycin 4/6 >> 4/8  Cefepime 4/6 >>4/8  Discharge Instructions   Current Discharge Medication List    CONTINUE these medications which have NOT CHANGED   Details  aspirin EC 81 MG tablet Take 81 mg by mouth daily.      Camphor-Eucalyptus-Menthol (MEDICATED CHEST RUB EX) Apply 1 application topically 2 (two) times daily.    donepezil (ARICEPT) 5 MG tablet Take 1 tablet (5 mg total) by mouth at bedtime. Qty: 30 tablet, Refills: 2    escitalopram (LEXAPRO) 10 MG tablet Take 10 mg by mouth daily.    fluticasone (FLONASE) 50 MCG/ACT nasal spray Place 1 spray into both  nostrils  daily.     haloperidol (HALDOL) 1 MG tablet Take 1 mg by mouth at bedtime.      insulin detemir (LEVEMIR) 100 UNIT/ML injection Inject 0.4 mLs (40 Units total) into the skin at bedtime. Qty: 10 mL, Refills: 3    linagliptin (TRADJENTA) 5 MG TABS tablet Take 5 mg by mouth daily.    lisinopril (PRINIVIL,ZESTRIL) 2.5 MG tablet Take 1 tablet (2.5 mg total) by mouth daily. Qty: 30 tablet, Refills: 1    metoprolol succinate (TOPROL XL) 25 MG 24 hr tablet Take 1 tablet (25 mg total) by mouth daily. Qty: 30 tablet, Refills: 1    Tamsulosin HCl (FLOMAX) 0.4 MG CAPS Take 0.4 mg by mouth daily.      Thiamine HCl (VITAMIN B-1) 100 MG tablet Take 100 mg by mouth daily.        STOP taking these medications     clotrimazole-betamethasone (LOTRISONE) cream      furosemide (LASIX) 40 MG tablet      insulin lispro (HUMALOG) 100 UNIT/ML injection        Allergies  Allergen Reactions  . Sulfa Antibiotics     The results of significant diagnostics from this hospitalization (including imaging, microbiology, ancillary and laboratory) are listed below for reference.    Significant Diagnostic Studies: Ct Abdomen Pelvis Wo Contrast  01/18/2016  CLINICAL DATA:  Nausea, vomiting and diarrhea with generalized abdominal pain EXAM: CT ABDOMEN AND PELVIS WITHOUT CONTRAST TECHNIQUE: Multidetector CT imaging of the abdomen and pelvis was performed following the standard protocol without IV contrast. COMPARISON:  None. FINDINGS: Lower chest: No pleural effusion identified. Dependent changes are in the lung bases. Hepatobiliary: No suspicious liver abnormalities identified. The gallbladder appears normal. There is no biliary dilatation. Pancreas: Normal appearance of the pancreas Spleen: Normal in size. Adrenals/Urinary Tract: Fat attenuation lesion within the left adrenal gland measures 3.7 cm, image 28 of series 2. This is new from previous exam. Normal appearance of the right adrenal gland. The kidneys are  unremarkable. Mild diffuse circumferential wall thickening involving the urinary bladder. Stomach/Bowel: The stomach appears normal. The small bowel loops have a normal course and caliber. No bowel obstruction. The appendix is visualized and appears normal. The scratch set there is wall thickening involving the sigmoid colon and rectum. There is a marked amount of desiccated stool identified within the rectum, image 79 of series 2. Vascular/Lymphatic: Calcified atherosclerotic disease involves the abdominal aorta. No aneurysm. No enlarged retroperitoneal or mesenteric adenopathy. No enlarged pelvic or inguinal lymph nodes. Reproductive: Prostate gland is not well visualized. Other: There is no ascites or focal fluid collections within the abdomen or pelvis. Musculoskeletal: Degenerative disc disease is noted throughout the lumbar spine. No fractures identified. IMPRESSION: 1. There is wall thickening involving the sigmoid colon and rectum. Additionally, there is a marked amount of desiccated stool within the rectum. Findings may reflect either stercoral colitis versus mild segmental colitis. No complications identified. 2. Aortic atherosclerosis. 3. Fat attenuation lesion associated with the left adrenal gland likely reflects a benign process such is a myelolipoma. Electronically Signed   By: Signa Kell M.D.   On: 01/18/2016 14:16   Dg Chest 2 View  01/18/2016  CLINICAL DATA:  Nausea, vomiting, diarrhea. EXAM: CHEST  2 VIEW COMPARISON:  December 31, 2015. FINDINGS: The heart size and mediastinal contours are within normal limits. Both lungs are clear. The visualized skeletal structures are unremarkable. IMPRESSION: No active cardiopulmonary disease. Electronically Signed   By: Roque Lias  Montez Hageman, M.D.   On: 01/18/2016 14:30    Microbiology: Recent Results (from the past 240 hour(s))  Urine culture     Status: None   Collection Time: 01/18/16  1:15 PM  Result Value Ref Range Status   Specimen Description  URINE, CATHETERIZED  Final   Special Requests NONE  Final   Culture   Final    NO GROWTH 1 DAY Performed at [facility]    Report Status 01/19/2016 FINAL  Final  Blood Culture (routine x 2)     Status: None   Collection Time: 01/18/16  1:45 PM  Result Value Ref Range Status   Specimen Description BLOOD LEFT HAND  Final   Special Requests BOTTLES DRAWN AEROBIC AND ANAEROBIC 5CC EACH  Final   Culture NO GROWTH 5 DAYS  Final   Report Status 01/23/2016 FINAL  Final  Blood Culture (routine x 2)     Status: None   Collection Time: 01/18/16  1:50 PM  Result Value Ref Range Status   Specimen Description BLOOD RIGHT HAND  Final   Special Requests BOTTLES DRAWN AEROBIC ONLY 4CC  Final   Culture NO GROWTH 5 DAYS  Final   Report Status 01/23/2016 FINAL  Final  Gastrointestinal Panel by PCR , Stool     Status: None   Collection Time: 01/18/16  3:25 PM  Result Value Ref Range Status   Campylobacter species NOT DETECTED NOT DETECTED Final   Plesimonas shigelloides NOT DETECTED NOT DETECTED Final   Salmonella species NOT DETECTED NOT DETECTED Final   Yersinia enterocolitica NOT DETECTED NOT DETECTED Final   Vibrio species NOT DETECTED NOT DETECTED Final   Vibrio cholerae NOT DETECTED NOT DETECTED Final   Enteroaggregative E coli (EAEC) NOT DETECTED NOT DETECTED Final   Enteropathogenic E coli (EPEC) NOT DETECTED NOT DETECTED Final   Enterotoxigenic E coli (ETEC) NOT DETECTED NOT DETECTED Final   Shiga like toxin producing E coli (STEC) NOT DETECTED NOT DETECTED Final   E. coli O157 NOT DETECTED NOT DETECTED Final   Shigella/Enteroinvasive E coli (EIEC) NOT DETECTED NOT DETECTED Final   Cryptosporidium NOT DETECTED NOT DETECTED Final   Cyclospora cayetanensis NOT DETECTED NOT DETECTED Final   Entamoeba histolytica NOT DETECTED NOT DETECTED Final   Giardia lamblia NOT DETECTED NOT DETECTED Final   Adenovirus F40/41 NOT DETECTED NOT DETECTED Final   Astrovirus NOT DETECTED NOT DETECTED  Final   Norovirus GI/GII NOT DETECTED NOT DETECTED Final   Rotavirus A NOT DETECTED NOT DETECTED Final   Sapovirus (I, II, IV, and V) NOT DETECTED NOT DETECTED Final  MRSA PCR Screening     Status: Abnormal   Collection Time: 01/18/16  5:20 PM  Result Value Ref Range Status   MRSA by PCR POSITIVE (A) NEGATIVE Final    Comment:        The GeneXpert MRSA Assay (FDA approved for NASAL specimens only), is one component of a comprehensive MRSA colonization surveillance program. It is not intended to diagnose MRSA infection nor to guide or monitor treatment for MRSA infections. RESULT CALLED TO, READ BACK BY AND VERIFIED WITH: WAGONER,R. RN AT 1717 ON 01/18/2016 BY BAUGHAM,M.   C difficile quick scan w PCR reflex     Status: None   Collection Time: 01/18/16  9:10 PM  Result Value Ref Range Status   C Diff antigen NEGATIVE NEGATIVE Final   C Diff toxin NEGATIVE NEGATIVE Final   C Diff interpretation Negative for toxigenic C. difficile  Final  Labs: Basic Metabolic Panel:  Recent Labs Lab 01/20/16 0820 01/21/16 0604 01/22/16 0523 01/23/16 0523 01/24/16 0514  NA 155* 150* 151* 148* 143  K 3.3* 4.0 4.2 3.9 3.7  CL 128* 123* 121* 115* 111  CO2 20* GLUCOSE 142* 212* 157* 270* 328*  BUN 103* 70* 48* 34* 28*  CREATININE 2.53* 1.68* 1.22 1.15 1.05  CALCIUM 7.7* 7.6* 7.9* 8.0* 7.7*  PHOS 3.5 2.5 2.1* 2.5 2.8   Liver Function Tests:  Recent Labs Lab 01/18/16 1140 01/20/16 0820 01/21/16 0604 01/22/16 0523 01/23/16 0523 01/24/16 0514  AST 44*  --   --   --   --   --   ALT 40  --   --   --   --   --   ALKPHOS 90  --   --   --   --   --   BILITOT 0.5  --   --   --   --   --   PROT 7.5  --   --   --   --   --   ALBUMIN 3.6 2.5* 2.4* 2.5* 2.4* 2.4*    Recent Labs Lab 01/18/16 1140  LIPASE 56*   CBC:  Recent Labs Lab 01/18/16 1140 01/19/16 0414 01/21/16 0604  WBC 15.9* 12.0* 8.1  NEUTROABS  --  8.6*  --   HGB 14.6 12.0* 11.2*  HCT 44.9 37.9*  35.3*  MCV 87.4 89.2 88.5  PLT 164 144* 112*   Cardiac Enzymes:  Recent Labs Lab 01/18/16 1140  TROPONINI 0.09*    Recent Labs  12/07/15 2144 12/31/15 1245  BNP 1063.0* 73.0   CBG:  Recent Labs Lab 01/23/16 1137 01/23/16 1620 01/23/16 2051 01/24/16 0803 01/24/16 1155  GLUCAP 231* 306* 305* 321* 250*    Active Problems:   DM type 2 causing vascular disease (HCC)   Essential hypertension, benign   ARF (acute renal failure) (HCC)   Dehydration   Lactic acidosis   Hypernatremia   Elevated troponin   Chronic combined systolic and diastolic CHF (congestive heart failure) (HCC)   Acute renal failure (HCC)   Time coordinating discharge: 35 minutes   Signed:  Brendia Sacks, MD [facility] 01/24/2016, 3:25 PM    By signing my name below, I, Zadie Cleverly attest that this documentation has been prepared under the direction and in the presence of Brendia Sacks, MD Electronically signed: Zadie Cleverly  01/24/2016 2:16pm   I personally performed the services described in this documentation. All medical record entries made by the scribe were at my direction. I have reviewed the chart and agree that the record reflects my personal performance and is accurate and complete. Brendia Sacks, MD

## 2016-01-24 DIAGNOSIS — D696 Thrombocytopenia, unspecified: Secondary | ICD-10-CM

## 2016-01-24 DIAGNOSIS — N179 Acute kidney failure, unspecified: Secondary | ICD-10-CM

## 2016-01-24 LAB — GLUCOSE, CAPILLARY
Glucose-Capillary: 321 mg/dL — ABNORMAL HIGH (ref 65–99)
Glucose-Capillary: 250 mg/dL — ABNORMAL HIGH (ref 65–99)

## 2016-01-24 LAB — RENAL FUNCTION PANEL
Albumin: 2.4 g/dL — ABNORMAL LOW (ref 3.5–5.0)
Anion gap: 9 (ref 5–15)
BUN: 28 mg/dL — AB (ref 6–20)
CALCIUM: 7.7 mg/dL — AB (ref 8.9–10.3)
CHLORIDE: 111 mmol/L (ref 101–111)
CO2: 23 mmol/L (ref 22–32)
CREATININE: 1.05 mg/dL (ref 0.61–1.24)
GFR calc non Af Amer: >60 mL/min (ref >60)
GLUCOSE: 328 mg/dL — AB (ref 65–99)
Phosphorus: 2.8 mg/dL (ref 2.5–4.6)
Potassium: 3.7 mmol/L (ref 3.5–5.1)
SODIUM: 143 mmol/L (ref 135–145)

## 2016-01-24 MED ORDER — INSULIN DETEMIR 100 UNIT/ML ~~LOC~~ SOLN
25.0000 [IU] | Freq: Every day | SUBCUTANEOUS | Status: DC
  Filled 2016-01-24: qty 0.25

## 2016-01-24 NOTE — Clinical Social Work Placement (Signed)
   CLINICAL SOCIAL WORK PLACEMENT  NOTE  Date:  01/24/2016  Patient Details  Name: Cody [name] MRN: [id number] Date of Birth: 12/24/1932  Clinical Social Work is seeking post-discharge placement for this patient at the Skilled  Nursing Facility level of care (*CSW will initial, date and re-position this form in  chart as items are completed):  Yes   Patient/family provided with [facility] Clinical Social Work Department's list of facilities offering this level of care within the geographic area requested by the patient (or if unable, by the patient's family).  Yes   Patient/family informed of their freedom to choose among providers that offer the needed level of care, that participate in Medicare, Medicaid or managed care program needed by the patient, have an available bed and are willing to accept the patient.  Yes   Patient/family informed of 's ownership interest in [facility] and [facility], as well as of the fact that they are under no obligation to receive care at these facilities.  PASRR submitted to EDS on 01/24/16     PASRR number received on 01/24/16     Existing PASRR number confirmed on       FL2 transmitted to all facilities in geographic area requested by pt/family on 01/24/16     FL2 transmitted to all facilities within larger geographic area on       Patient informed that his/her managed care company has contracts with or will negotiate with certain facilities, including the following:        Yes   Patient/family informed of bed offers received.  Patient chooses bed at     [facility]  Physician recommends and patient chooses bed at     SNF Patient to be transferred to   on   01/24/2016  Patient to be transferred to facility by     EMS  Patient family notified on   of transfer.  None noted by patient. Patient aware.  Name of family member notified:        NA  PHYSICIAN Please sign FL2     Additional Comment:     _______________________________________________ Raye Sorrowoble, Tyria Springer N, LCSW 01/24/2016, 2:18 PM

## 2016-01-24 NOTE — Progress Notes (Signed)
Bed chosen at [facility] Patient to transport by EMS. No family to contact. Patient made aware of bed offers and chose [facility]. LCSW set up EMS. Facility agreeable to plan.  No other needs. DC to SNF today.  Cody EmoryHannah Yoshi Vicencio LCSW, MSW Clinical Social Work: [facility] [phone]

## 2016-01-24 NOTE — Progress Notes (Signed)
Inpatient Diabetes Program Recommendations  AACE/ADA: New Consensus Statement on Inpatient Glycemic Control (2015)  Target Ranges:  Prepandial:   less than 140 mg/dL      Peak postprandial:   less than 180 mg/dL (1-2 hours)      Critically ill patients:  140 - 180 mg/dL   Review of Glycemic Control Results for Cody CulverBELTON, Cody [name] (MRN [id number]) as of 01/24/2016 08:51  Ref. Range 01/23/2016 07:35 01/23/2016 11:37 01/23/2016 16:20 01/23/2016 20:51 01/24/2016 08:03  Glucose-Capillary Latest Ref Range: 65-99 mg/dL 409245 (H) 811231 (H) 914306 (H) 305 (H) 321 (H)   Inpatient Diabetes Program Recommendations:  Note [facility]. Please consider increase in Levemir to 25 units daily.  Thank you, Billy FischerJudy [name]. Caitriona Sundquist, RN, MSN, CDE Inpatient Glycemic Control Team Team Pager [phone] (8am-5pm) 01/24/2016 8:53 AM

## 2016-01-24 NOTE — Progress Notes (Addendum)
PROGRESS NOTE  Cody [name]:[id number] DOB: 01-Apr-1933 DOA: 01/18/2016 PCP: Laqueta Linden, MD  Summary: 82 year old man presented with nausea, vomiting, diarrhea, generalized weakness and abdominal pain. Initial evaluation revealed hypoxia and hypotension as well as acute kidney injury with anion gap metabolic acidosis, lactic acidosis. Admitted for sepsis secondary to UTI, acute kidney injury possible colitis. However all culture data and stool studies were unremarkable and the patient's symptoms resolved rapidly with hydration and ultimately acute presentation initially thought to be sepsis was rather hypotension secondary to dehydration.  Assessment/Plan: 1. Hypotension, secondary to dehydration. Resolved. Sepsis ruled out. Urine culture and blood cultures were negative. GI pathogen panel and C. difficile testing was negative. Chest x-ray no acute disease. CT abdomen and pelvis on admission suggested desiccated stool, stercoral colitis. Bowels moving well. Asymptomatic. 2. Acute kidney injury. Resolved with aggressive hydration. Secondary to ATN/prerenal/vomiting and GI losses. 3. Hypertension. Stable. 4. Diabetes mellitus type 2. Stable. 5. Hypernatremia secondary to dehydration. Resolved. 6. Chronic combined systolic and diastolic congestive heart failure has remained stable. 7. Elevated troponin. Secondary to demand ischemia. EKG showed a ventricularly paced rhythm. Chest x-ray no acute disease. 8. Advanced Parkinson's 9. Dementia 10. Aortic atherosclerosis. 11. Mild thrombocytopenia, seen in the past. Suggest outpatient follow-up.    Overall doing well, plan discharge to SNF today   Code Status: Full DVT prophylaxis: Heparin  Family Communication: No family bedside Disposition Plan: Discharge to SNF today  Brendia Sacks, MD  [facility] Direct contact:  --Via amion app OR  --[email or web address]; password [email or web address] and click  7PM-7AM contact night coverage as  above 01/24/2016, 3:28 PM  LOS: 6 days   Consultants:  Nephrology  PT-SNF  Procedures:  None  Antibiotics:  Vancomycin 4/6 >> 4/8  Cefepime 4/6 >>4/8  HPI/Subjective: Doing well. Able to eat without difficulty. No reports of chest pain, shortness of breath, n/v/d, or abd pain.   Objective: Filed Vitals:   01/23/16 0923 01/23/16 1621 01/23/16 2100 01/24/16 0500  BP: 167/53 146/53 165/61 141/50  Pulse: 63 62 66 63  Temp:  98.6 F (37 C) 98.1 F (36.7 C) 97.3 F (36.3 C)  TempSrc:   Oral Oral  Resp: Height:      Weight:      SpO2: 99% 98% 98% 97%    Intake/Output Summary (Last 24 hours) at 01/24/16 1528 Last data filed at 01/24/16 1230  Gross per 24 hour  Intake    123 ml  Output      0 ml  Net    123 ml     Filed Weights   01/18/16 1328 01/19/16 0500 01/20/16 0400  Weight: 67.132 kg (148 lb) 63.6 kg (140 lb 3.4 oz) 64.2 kg (141 lb 8.6 oz)    Exam: Constitutional:  Appears calm and comfortable Respiratory:  CTA bilaterally, no w/r/r.  Respiratory effort normal. No retractions or accessory muscle use Cardiovascular:  RRR, no m/r/g No LE extremity edema   Normal pedal pulses Psychiatric:  Mental status Mood, affect appropriate  New data reviewed:  CBG Stable.  BUN continues to trend down rapidly. Creatinine has normalized.   Scheduled Meds: . aspirin  243 mg Oral Once  . aspirin EC  81 mg Oral Daily  . donepezil  5 mg Oral QHS  . escitalopram  10 mg Oral Daily  . fluticasone  1 spray Each Nare Daily  . haloperidol  1 mg Oral QHS  . heparin  5,000 Units Subcutaneous 3 times  per day  . insulin aspart  0-15 Units Subcutaneous TID WC  . [START ON 01/25/2016] insulin detemir  25 Units Subcutaneous Daily  . metoprolol succinate  25 mg Oral Daily  . sodium chloride flush  3 mL Intravenous Q12H  . tamsulosin  0.4 mg Oral Daily  . thiamine  100 mg Oral Daily   Continuous Infusions: . dextrose 100 mL/hr at 01/24/16 0300     Principal Problem:   AKI (acute kidney injury) (HCC) Active Problems:   DM type 2 causing vascular disease (HCC)   Essential hypertension, benign   Dehydration   Lactic acidosis   Hypernatremia   Elevated troponin   Chronic combined systolic and diastolic CHF (congestive heart failure) (HCC)   Thrombocytopenia (HCC)   By signing my name below, I, Zadie CleverlyJessica Augustus attest that this documentation has been prepared under the direction and in the presence of Brendia Sacksaniel Shyanne Mcclary, MD Electronically signed: Zadie CleverlyJessica Augustus  01/24/2016 2:16pm   I personally performed the services described in this documentation. All medical record entries made by the scribe were at my direction. I have reviewed the chart and agree that the record reflects my personal performance and is accurate and complete. Brendia Sacksaniel Janin Kozlowski, MD

## 2016-01-24 NOTE — Progress Notes (Signed)
Cody [name]  MRN: [id number]  DOB/AGE: 80-17-1934 80 y.o.  Primary Care Physician:KONESWARAN, Ottie GlazierSURESH A, MD  Admit date: 01/18/2016  Chief Complaint:  Chief Complaint  Patient presents with  . Weakness    S-Pt presented on  01/18/2016 with  Chief Complaint  Patient presents with  . Weakness  .    Pt today feels better   . aspirin  243 mg Oral Once  . aspirin EC  81 mg Oral Daily  . donepezil  5 mg Oral QHS  . escitalopram  10 mg Oral Daily  . fluticasone  1 spray Each Nare Daily  . haloperidol  1 mg Oral QHS  . heparin  5,000 Units Subcutaneous 3 times per day  . insulin aspart  0-15 Units Subcutaneous TID WC  . insulin detemir  10 Units Subcutaneous Daily  . metoprolol succinate  25 mg Oral Daily  . sodium chloride flush  3 mL Intravenous Q12H  . tamsulosin  0.4 mg Oral Daily  . thiamine  100 mg Oral Daily      Physical Exam: Vital signs in last 24 hours: Temp:  [97.3 F (36.3 C)-98.6 F (37 C)] 97.3 F (36.3 C) (04/12 0500) Pulse Rate:  [62-66] 63 (04/12 0500) Resp:  [20] 20 (04/12 0500) BP: (141-167)/(50-61) 141/50 mmHg (04/12 0500) SpO2:  [97 %-99 %] 97 % (04/12 0500) Weight change:  Last BM Date: 01/23/16  Intake/Output from previous day: 04/11 0701 - 04/12 0700 In: 223 [P.O.:220; I.V.:3] Out: -      Physical Exam: General- pt is awake,alert, oriented to time place and person Resp- No acute REsp distress, CTA B/L NO Rhonchi CVS- S1S2 regular in rate and rhythm GIT- BS+, soft, NT, ND EXT- NO LE Edema, Cyanosis   Lab Results: CBC No results for input(s): WBC, HGB, HCT, PLT in the last 72 hours.  BMET  Recent Labs  01/23/16 0523 01/24/16 0514  NA 148* 143  K 3.9 3.7  CL 115* 111  CO2 24 23  GLUCOSE 270* 328*  BUN 34* 28*  CREATININE 1.15 1.05  CALCIUM 8.0* 7.7*    Creat trend 2017  6.95=>1.15=>1.05   Sodium trend 2017  155=>148=>143  MICRO Recent Results (from the past 240 hour(s))  Urine culture     Status: None   Collection Time: 01/18/16  1:15 PM  Result Value Ref Range Status   Specimen Description URINE, CATHETERIZED  Final   Special Requests NONE  Final   Culture   Final    NO GROWTH 1 DAY [facility]    Report Status 01/19/2016 FINAL  Final  Blood Culture (routine x 2)     Status: None   Collection Time: 01/18/16  1:45 PM  Result Value Ref Range Status   Specimen Description BLOOD LEFT HAND  Final   Special Requests BOTTLES DRAWN AEROBIC AND ANAEROBIC 5CC EACH  Final   Culture NO GROWTH 5 DAYS  Final   Report Status 01/23/2016 FINAL  Final  Blood Culture (routine x 2)     Status: None   Collection Time: 01/18/16  1:50 PM  Result Value Ref Range Status   Specimen Description BLOOD RIGHT HAND  Final   Special Requests BOTTLES DRAWN AEROBIC ONLY 4CC  Final   Culture NO GROWTH 5 DAYS  Final   Report Status 01/23/2016 FINAL  Final  Gastrointestinal Panel by PCR , Stool     Status: None   Collection Time: 01/18/16  3:25 PM  Result Value Ref Range Status   Campylobacter species NOT DETECTED NOT DETECTED Final   Plesimonas shigelloides NOT DETECTED NOT DETECTED Final   Salmonella species NOT DETECTED NOT DETECTED Final   Yersinia enterocolitica NOT DETECTED NOT DETECTED Final   Vibrio species NOT DETECTED NOT DETECTED Final   Vibrio cholerae NOT DETECTED NOT DETECTED Final   Enteroaggregative E coli (EAEC) NOT DETECTED NOT DETECTED Final   Enteropathogenic E coli (EPEC) NOT DETECTED NOT DETECTED Final   Enterotoxigenic E coli (ETEC) NOT DETECTED NOT DETECTED Final   Shiga like toxin producing E coli (STEC) NOT DETECTED NOT DETECTED Final   E. coli O157 NOT DETECTED NOT DETECTED Final   Shigella/Enteroinvasive E coli (EIEC) NOT DETECTED NOT DETECTED Final   Cryptosporidium NOT DETECTED NOT DETECTED Final   Cyclospora cayetanensis NOT DETECTED NOT DETECTED Final   Entamoeba histolytica NOT DETECTED NOT DETECTED Final   Giardia lamblia NOT DETECTED NOT DETECTED Final    Adenovirus F40/41 NOT DETECTED NOT DETECTED Final   Astrovirus NOT DETECTED NOT DETECTED Final   Norovirus GI/GII NOT DETECTED NOT DETECTED Final   Rotavirus A NOT DETECTED NOT DETECTED Final   Sapovirus (I, II, IV, and V) NOT DETECTED NOT DETECTED Final  MRSA PCR Screening     Status: Abnormal   Collection Time: 01/18/16  5:20 PM  Result Value Ref Range Status   MRSA by PCR POSITIVE (A) NEGATIVE Final    Comment:        The GeneXpert MRSA Assay (FDA approved for NASAL specimens only), is one component of a comprehensive MRSA colonization surveillance program. It is not intended to diagnose MRSA infection nor to guide or monitor treatment for MRSA infections. RESULT CALLED TO, READ BACK BY AND VERIFIED WITH: WAGONER,R. RN AT 1717 ON 01/18/2016 BY BAUGHAM,M.   C difficile quick scan w PCR reflex     Status: None   Collection Time: 01/18/16  9:10 PM  Result Value Ref Range Status   C Diff antigen NEGATIVE NEGATIVE Final   C Diff toxin NEGATIVE NEGATIVE Final   C Diff interpretation Negative for toxigenic C. difficile  Final      Lab Results  Component Value Date   CALCIUM 7.7* 01/24/2016   PHOS 2.8 01/24/2016               Impression: 1)Renal  AKI secondary to Prerenal/ATN                AKI sec to Hypovolemia/Hypotension                AKI now better               2)HTN  BP stable   3)Anemia HGb stable  4)CHF- stable  5)Hypernatremia  now at goal  6)Endo- hx of DM     Primary team following  7)Acid base Co2 now at goal     Plan:   Will continue current care      BHUTANI,MANPREET S 01/24/2016, 9:18 AM

## 2016-01-24 NOTE — Care Management Important Message (Signed)
Important Message  Patient Details  Name: Cody [name] MRN: [id number] Date of Birth: 06-May-1933   Medicare Important Message Given:  Yes    Adonis HugueninBerkhead, Shaquayla Klimas L, RN 01/24/2016, 1:58 PM

## 2016-03-06 ENCOUNTER — Encounter: Payer: Self-pay | Admitting: Internal Medicine

## 2016-03-12 ENCOUNTER — Encounter: Payer: Medicare Other | Admitting: Internal Medicine

## 2016-03-12 NOTE — Progress Notes (Signed)
Patient Care Team: Laqueta LindenSuresh A Koneswaran, MD as PCP - General (Cardiology)   HPI  Montel CulverRussell E Rosal is a 82 y.o. male Seen in follow-up for pacing implanted for complete heart block 2/17. This was undertaken with some ambivalence given a history of dementia and prior comments in the chart regarding not pacing. However, with worsening symptoms it was elected to proceed.  Records and Results Reviewed  there have been 3 interval hospitalizations for renal insufficiency on 2 occasions and acute on chronic heart failure  Past Medical History  Diagnosis Date  . Essential hypertension, benign   . Type 2 diabetes mellitus (HCC)   . Dementia   . Parkinson's disease   . Insomnia   . Benign prostate hyperplasia   . CHF (congestive heart failure) (HCC)   . Anemia   . Symptomatic bradycardia   . Syncope   . Complete AV block (HCC)   . Legally blind   . At high risk for falls 11/2014    Hx falls  . Pacemaker 12/01/2015    [employer]    Past Surgical History  Procedure Laterality Date  . Cardiac catheterization N/A 11/30/2015    Procedure: Temporary Pacemaker;  Surgeon: Orpah CobbAjay Kadakia, MD;  Location: [facility] INVASIVE CV [facility];  Service: Cardiovascular;  Laterality: N/A;  . Ep implantable device N/A 12/01/2015    Procedure: Pacemaker Implant;  Surgeon: Duke SalviaSteven C Fenris Cauble, MD;  [facility];  Service: Cardiovascular;  Laterality: N/A;    Current Outpatient Prescriptions  Medication Sig Dispense Refill  . aspirin EC 81 MG tablet Take 81 mg by mouth daily.      . Camphor-Eucalyptus-Menthol (MEDICATED CHEST RUB EX) Apply 1 application topically 2 (two) times daily.    Marland Kitchen. donepezil (ARICEPT) 5 MG tablet Take 1 tablet (5 mg total) by mouth at bedtime. 30 tablet 2  . escitalopram (LEXAPRO) 10 MG tablet Take 10 mg by mouth daily.    . fluticasone (FLONASE) 50 MCG/ACT nasal spray Place 1 spray into both nostrils daily.     . haloperidol (HALDOL) 1 MG tablet Take 1 mg by mouth at bedtime.        . insulin detemir (LEVEMIR) 100 UNIT/ML injection Inject 0.4 mLs (40 Units total) into the skin at bedtime. 10 mL 3  . linagliptin (TRADJENTA) 5 MG TABS tablet Take 5 mg by mouth daily.    Marland Kitchen. lisinopril (PRINIVIL,ZESTRIL) 2.5 MG tablet Take 1 tablet (2.5 mg total) by mouth daily. 30 tablet 1  . metoprolol succinate (TOPROL XL) 25 MG 24 hr tablet Take 1 tablet (25 mg total) by mouth daily. 30 tablet 1  . Tamsulosin HCl (FLOMAX) 0.4 MG CAPS Take 0.4 mg by mouth daily.      . Thiamine HCl (VITAMIN B-1) 100 MG tablet Take 100 mg by mouth daily.       No current facility-administered medications for this visit.    Allergies  Allergen Reactions  . Sulfa Antibiotics     unknown      Review of Systems negative except from HPI and PMH  Physical Exam There were no vitals taken for this visit. Well developed and well nourished in no acute distress HENT normal E scleral and icterus clear Neck Supple JVP flat; carotids brisk and full Clear to ausculation  Regular rate and rhythm, no murmurs gallops or rub Soft with active bowel sounds No clubbing cyanosis Edema Alert and oriented, grossly normal motor and sensory function Skin Warm and  Dry    Assessment and  Plan

## 2016-03-13 ENCOUNTER — Encounter: Payer: Self-pay | Admitting: Internal Medicine

## 2016-03-14 ENCOUNTER — Encounter: Payer: Self-pay | Admitting: Internal Medicine

## 2016-03-14 DEATH — deceased

## 2016-04-04 ENCOUNTER — Encounter: Payer: Medicare Other | Admitting: "Endocrinology

## 2016-04-04 NOTE — Progress Notes (Signed)
This encounter was created in error - please disregard.

## 2016-04-09 ENCOUNTER — Encounter: Payer: Medicare Other | Admitting: Adult Health

## 2016-04-09 ENCOUNTER — Encounter: Payer: Self-pay | Admitting: Adult Health

## 2016-04-09 NOTE — Progress Notes (Signed)
Cardiology Office Note   Date:  04/09/2016   ID:  Cody [name], DOB [birth year], MRN [id number]  PCP:  Prentice DockerSuresh Koneswaran, MD  Cardiologist: [place] CalixBranch/  Caleigh Rabelo, NP   No chief complaint on file.  No Show

## 2016-06-27 NOTE — Progress Notes (Signed)
This encounter was created in error - please disregard.

## 2016-09-26 IMAGING — CT CT ABD-PELV W/O CM
2 of 8 series · 15 of 46 positions shown, 17 images · non-contrast
Comparison: None.

CLINICAL DATA: Nausea, vomiting and diarrhea with generalized
abdominal pain

EXAM:
CT ABDOMEN AND PELVIS WITHOUT CONTRAST
TECHNIQUE: Multidetector CT imaging of the abdomen and pelvis was performed
following the standard protocol without IV contrast.

[Series 2: abdomen/pelvis w/o contrast · axial · non-contrast · 0.63mm/px · z∈[-562,-126]mm · 12 of 99 slices shown, 14 images]
[im 6/99  soft-tissue]
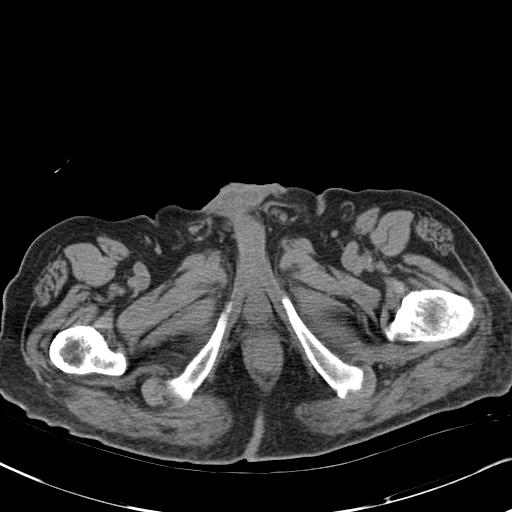
[im 6/99  bone]
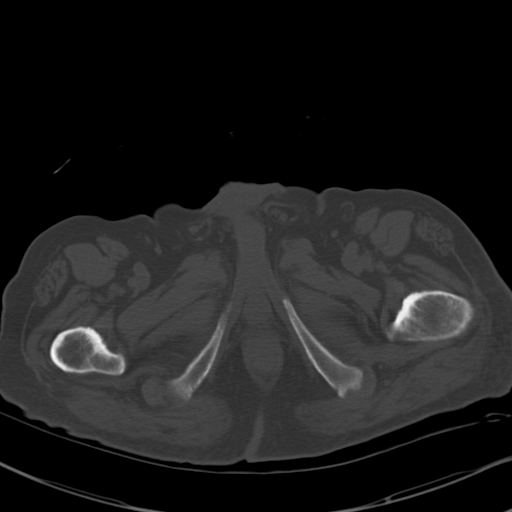
[im 16/99  soft-tissue]
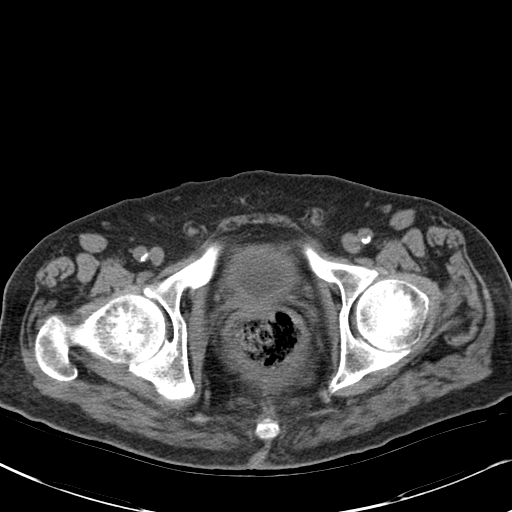
[im 21/99  soft-tissue]
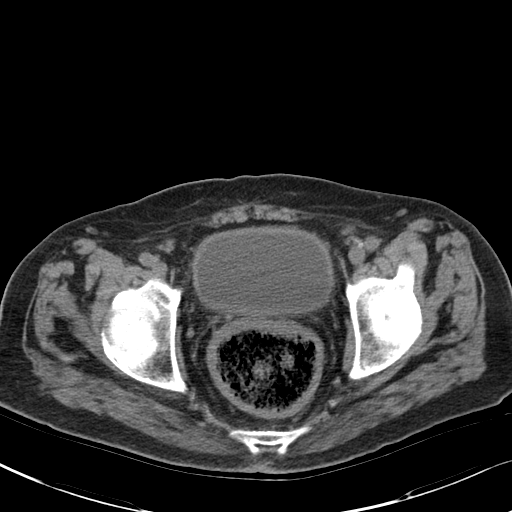
[im 31/99  soft-tissue]
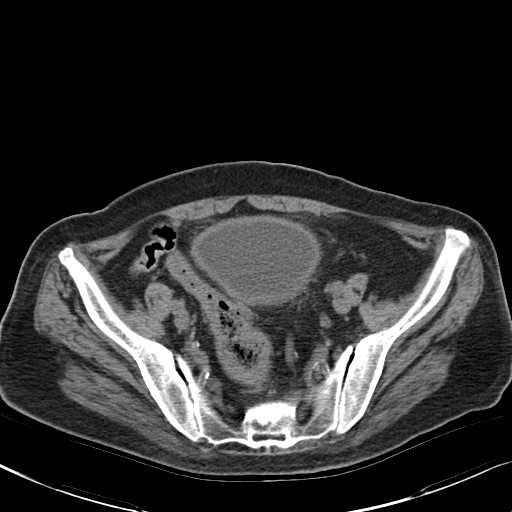
[im 37/99  soft-tissue]
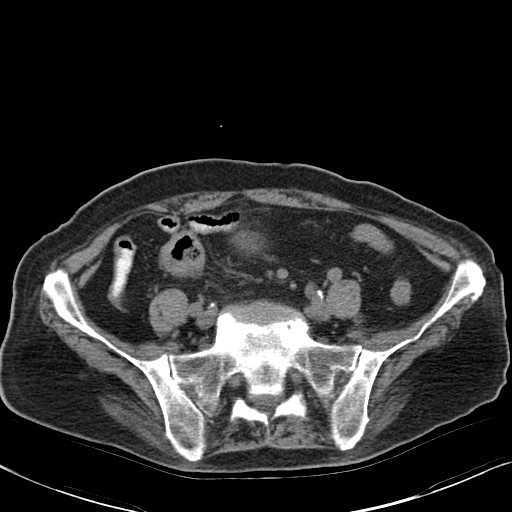
[im 47/99  soft-tissue]
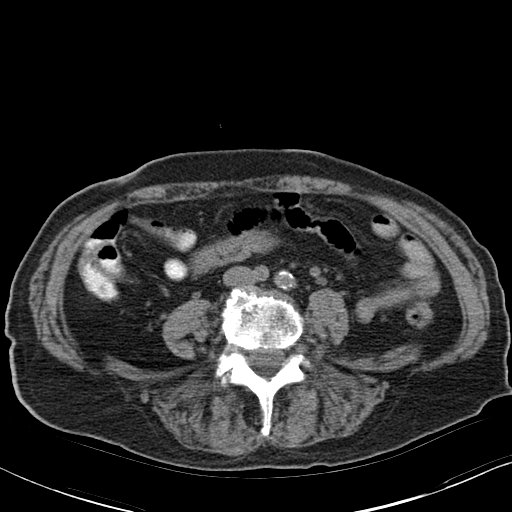
[im 52/99  soft-tissue]
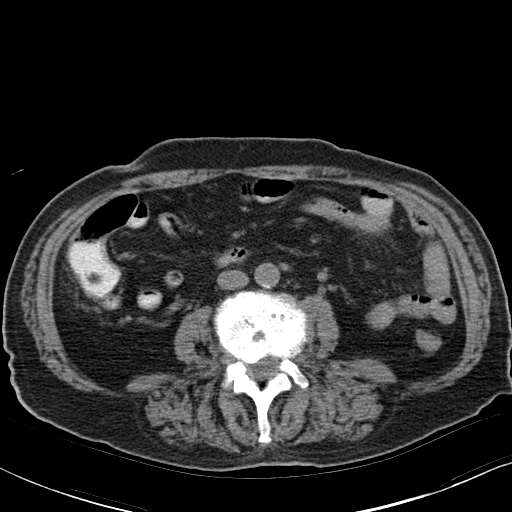
[im 62/99  soft-tissue]
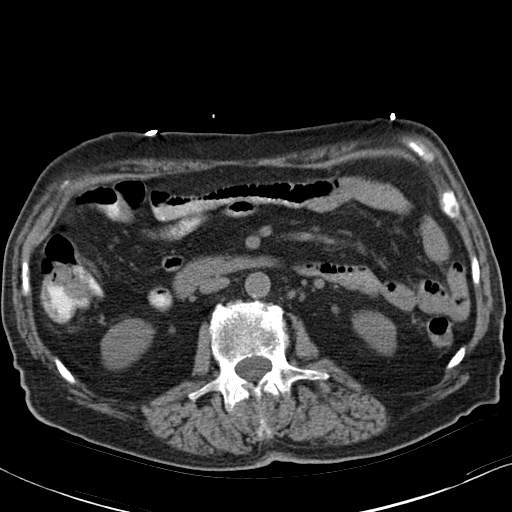
[im 68/99  soft-tissue]
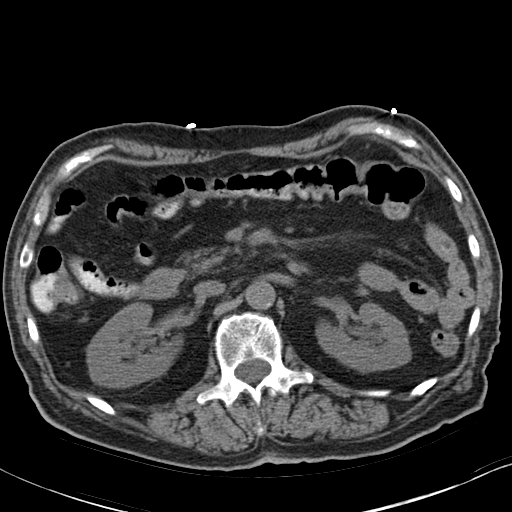
[im 68/99  bone]
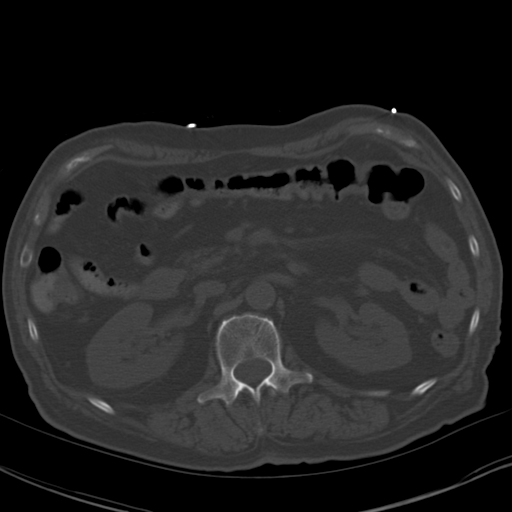
[im 78/99  soft-tissue]
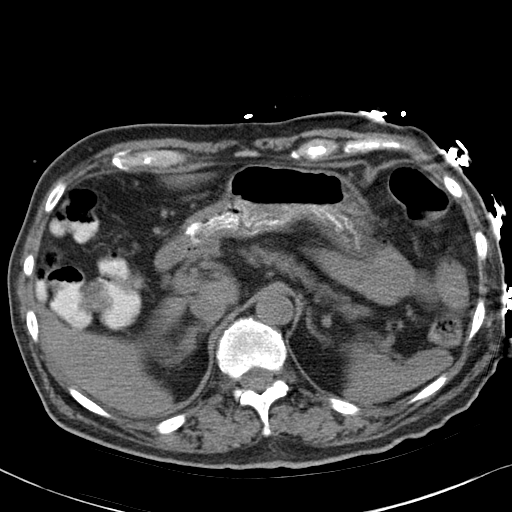
[im 83/99  soft-tissue]
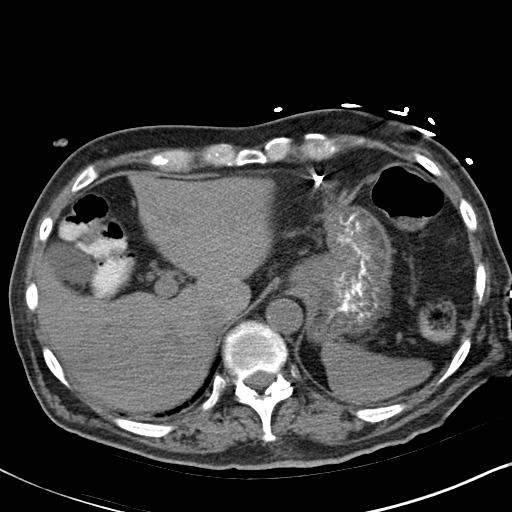
[im 93/99  soft-tissue]
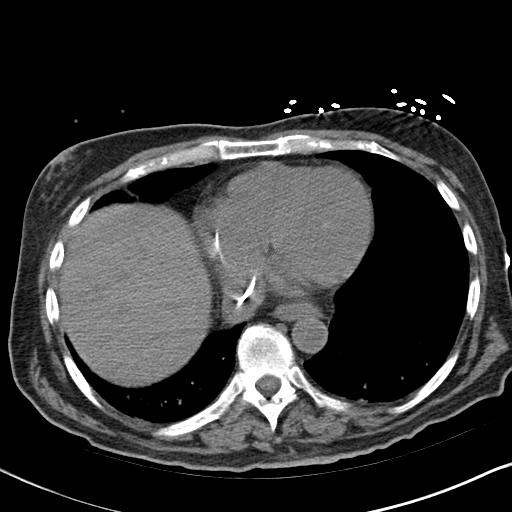

[Series 4: mpr cor (id) · coronal · 0.72mm/px · 3 of 102 slices shown]
[im 26/102  soft-tissue]
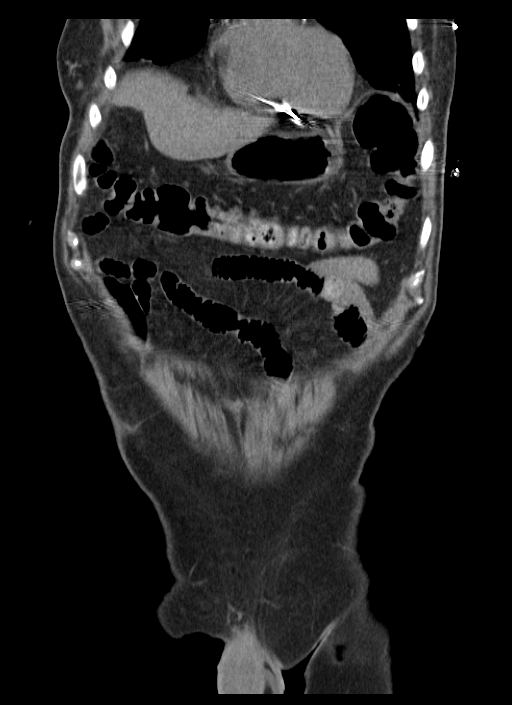
[im 51/102  soft-tissue]
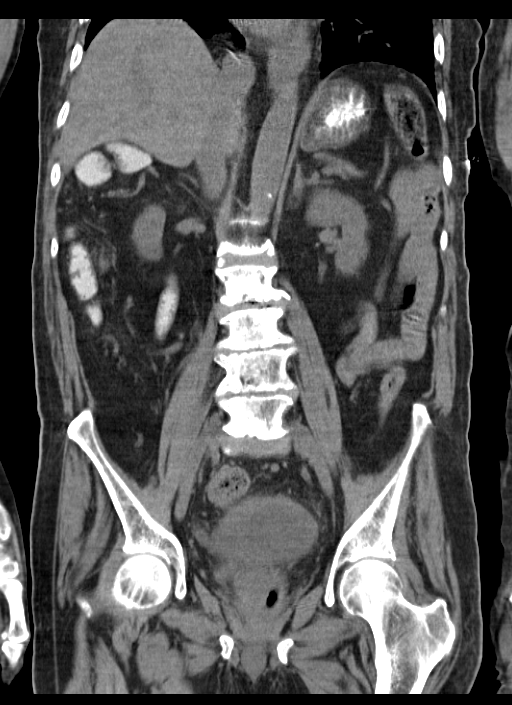
[im 76/102  soft-tissue]
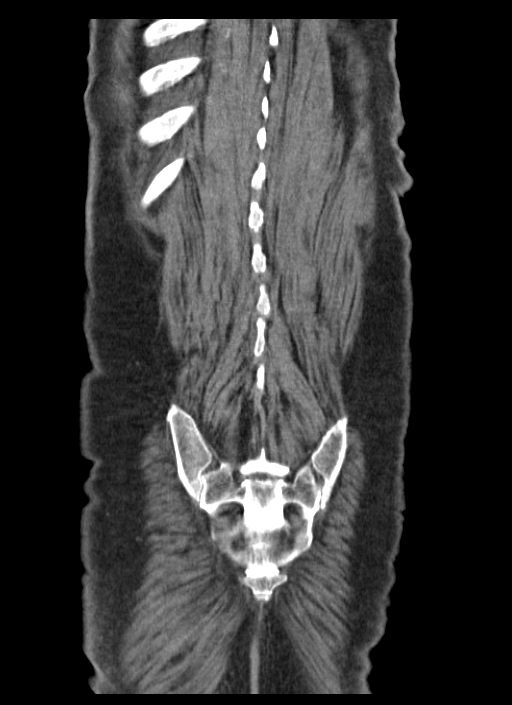

[15 of 46 positions shown; findings below may reference images not displayed]

FINDINGS: Lower chest: No pleural effusion identified. Dependent changes are
in the lung bases.

Hepatobiliary: No suspicious liver abnormalities identified. The
gallbladder appears normal. There is no biliary dilatation.

Pancreas: Normal appearance of the pancreas

Spleen: Normal in size.

Adrenals/Urinary Tract: Fat attenuation lesion within the left
adrenal gland measures 3.7 cm, image 28 of series 2. This is new
from previous exam. Normal appearance of the right adrenal gland.
The kidneys are unremarkable. Mild diffuse circumferential wall
thickening involving the urinary bladder.

Stomach/Bowel: The stomach appears normal. The small bowel loops
have a normal course and caliber. No bowel obstruction. The appendix
is visualized and appears normal. The scratch set there is wall
thickening involving the sigmoid colon and rectum. There is a marked
amount of desiccated stool identified within the rectum, image 79 of
series 2.

Vascular/Lymphatic: Calcified atherosclerotic disease involves the
abdominal aorta. No aneurysm. No enlarged retroperitoneal or
mesenteric adenopathy. No enlarged pelvic or inguinal lymph nodes.

Reproductive: Prostate gland is not well visualized.

Other: There is no ascites or focal fluid collections within the
abdomen or pelvis.

Musculoskeletal: Degenerative disc disease is noted throughout the
lumbar spine. No fractures identified.
IMPRESSION: 1. There is wall thickening involving the sigmoid colon and rectum.
Additionally, there is a marked amount of desiccated stool within
the rectum. Findings may reflect either stercoral colitis versus
mild segmental colitis. No complications identified.
2. Aortic atherosclerosis.
3. Fat attenuation lesion associated with the left adrenal gland
likely reflects a benign process such is a myelolipoma.
